# Patient Record
Sex: Female | Born: 1995
Health system: Southern US, Community
[De-identification: ages and names within clinical notes are randomized; demographics above are authoritative.]

## PROBLEM LIST (undated history)

## (undated) DIAGNOSIS — M419 Scoliosis, unspecified: Secondary | ICD-10-CM

## (undated) DIAGNOSIS — F419 Anxiety disorder, unspecified: Secondary | ICD-10-CM

## (undated) DIAGNOSIS — O09299 Supervision of pregnancy with other poor reproductive or obstetric history, unspecified trimester: Secondary | ICD-10-CM

## (undated) HISTORY — PX: WISDOM TOOTH EXTRACTION: SHX21

## (undated) HISTORY — PX: ADENOIDECTOMY: SUR15

## (undated) HISTORY — DX: Supervision of pregnancy with other poor reproductive or obstetric history, unspecified trimester: O09.299

## (undated) HISTORY — PX: TONSILLECTOMY AND ADENOIDECTOMY: SUR1326

---

## 2014-02-07 ENCOUNTER — Encounter: Payer: Self-pay | Admitting: Women's Health

## 2014-02-07 ENCOUNTER — Ambulatory Visit (INDEPENDENT_AMBULATORY_CARE_PROVIDER_SITE_OTHER): Payer: BC Managed Care – PPO | Admitting: Women's Health

## 2014-02-07 VITALS — BP 124/58 | Ht 64.0 in | Wt 141.0 lb

## 2014-02-07 DIAGNOSIS — B3731 Acute candidiasis of vulva and vagina: Secondary | ICD-10-CM

## 2014-02-07 DIAGNOSIS — N921 Excessive and frequent menstruation with irregular cycle: Secondary | ICD-10-CM

## 2014-02-07 DIAGNOSIS — N898 Other specified noninflammatory disorders of vagina: Secondary | ICD-10-CM

## 2014-02-07 DIAGNOSIS — B373 Candidiasis of vulva and vagina: Secondary | ICD-10-CM | POA: Insufficient documentation

## 2014-02-07 LAB — POCT WET PREP (WET MOUNT): Clue Cells Wet Prep Whiff POC: NEGATIVE

## 2014-02-07 MED ORDER — FLUCONAZOLE 150 MG PO TABS: 150.0000 mg | ORAL_TABLET | Freq: Once | ORAL | Status: DC

## 2014-02-07 NOTE — Progress Notes (Signed)
Patient ID: Jacqueline Barrera, female   DOB: [birth year], 18 y.o.   MRN: [id number]   [facility] Visit  Patient name: Jacqueline Barrera MRN [id number]  Date of birth: [birth year]  CC & HPI:  Jacqueline Barrera is a 18 y.o. African American female presenting today for new gyn visit w/ [place], started depo in May at [facility], has gotten 2 injections, after this last one she began to have irregular bleeding- was given rx for estradiol which initially stopped the bleeding, so she stopped the pills, then she began to have a brownish slimy malodorous d/c- so she started pills back last week and has been on period since, having to wear panty liner daily. Condoms occasionally, w/ same partner for awhile. Last STD check in May.   Pertinent History Reviewed:  Medical & Surgical Hx:   History reviewed. No pertinent past medical history. Past Surgical History  Procedure Laterality Date  . Tonsillectomy and adenoidectomy     Medications: Reviewed & Updated - see associated section Social History: Reviewed -  reports that she quit smoking 4 days ago. Her smoking use included Cigarettes. She has a .3 pack-year smoking history. She does not have any smokeless tobacco history on file.  Objective Findings:  Vitals: BP 124/58  Ht  (1.626 m)  Wt 141 lb (63.957 kg)  BMI 24.19 kg/m2  Physical Examination: General appearance - alert, well appearing, and in no distress Pelvic - normal external genitalia, vulva Cervix posterior w/ few small white circular areas w/ brown dots in center on cervix- able to wipe these off w/ cotton swab. No cervical polyps. Thick white clumpy slightly malodorous d/c adherent to vaginal walls, some areas of brownish d/c as well. No menstrual type bleeding.  No CMT, uterus & adnexae w/o tenderness or masses  Results for orders placed in visit on 02/07/14 (from the past 24 hour(s))  POCT WET PREP (WET MOUNT)   Collection Time    02/07/14  9:50 AM      Result Value Ref Range   Source Wet  Prep POC vaginal     WBC, Wet Prep HPF POC many     Bacteria Wet Prep HPF POC none     BACTERIA WET PREP MORPHOLOGY POC       Clue Cells Wet Prep HPF POC None     Clue Cells Wet Prep Whiff POC Negative Whiff     Yeast Wet Prep HPF POC Moderate     KOH Wet Prep POC       Trichomonas Wet Prep HPF POC none       Assessment & Plan:  A:   BTB on Depo  Vaginal yeast P:  Rx diflucan  Stop estradiol for now  Will send urine for gc/ct  Condoms always for STI prevention   F/U when 18yo for pap & physical  Call back if BTB continues, will try megace  Marge Duncans CNM, WHNP-BC 02/07/2014 9:50 AM

## 2014-02-07 NOTE — Patient Instructions (Signed)

## 2014-02-08 ENCOUNTER — Telehealth: Payer: Self-pay | Admitting: Women's Health

## 2014-02-08 LAB — GC/CHLAMYDIA PROBE AMP
CT PROBE, AMP APTIMA: POSITIVE — AB
GC Probe RNA: NEGATIVE

## 2014-02-08 MED ORDER — AZITHROMYCIN 500 MG PO TABS: 1000.0000 mg | ORAL_TABLET | Freq: Once | ORAL | Status: DC

## 2014-02-08 NOTE — Telephone Encounter (Signed)
Notified pt of + CT, rx for azithromycin 1gm po x 1 sent to her pharmacy, she will call me back if she needs me to treat partner, otherwise he needs to go to HD or PCP for tx, no sex x 7d after both treated.  Cheral Marker, CNM, [facility] 02/08/2014 1:27 PM

## 2014-02-21 ENCOUNTER — Telehealth: Payer: Self-pay | Admitting: Women's Health

## 2014-02-21 NOTE — Telephone Encounter (Signed)
Pt called stating she does need her partner treated for STD.  His name is Wyatt Price DOB [birth year], NKDA he uses [facility] [place].  Pt informed that we would call ABX to [facility] for him.  Pt also states still having some vaginal d/c, she took the diflucan for yeast infection and finished abx for STD, she states she still has a Diflucan left, advised her she can take that and if d/c does not clear up she will need another office visit to evaluate.  Pt verbalized understanding.

## 2014-02-22 ENCOUNTER — Encounter: Payer: Self-pay | Admitting: Women's Health

## 2014-02-22 DIAGNOSIS — A749 Chlamydial infection, unspecified: Secondary | ICD-10-CM | POA: Insufficient documentation

## 2015-09-11 ENCOUNTER — Ambulatory Visit: Payer: Self-pay

## 2015-09-13 ENCOUNTER — Ambulatory Visit: Payer: 59 | Admitting: Adult Health

## 2016-04-01 ENCOUNTER — Other Ambulatory Visit: Payer: 59 | Admitting: Obstetrics and Gynecology

## 2016-04-01 ENCOUNTER — Encounter: Payer: Self-pay | Admitting: Obstetrics and Gynecology

## 2016-04-18 ENCOUNTER — Ambulatory Visit (INDEPENDENT_AMBULATORY_CARE_PROVIDER_SITE_OTHER): Payer: 59 | Admitting: Obstetrics and Gynecology

## 2016-04-18 ENCOUNTER — Other Ambulatory Visit (HOSPITAL_COMMUNITY)
Admission: RE | Admit: 2016-04-18 | Discharge: 2016-04-18 | Disposition: A | Discharge status: Home / Self Care | Payer: 59 | Source: Ambulatory Visit | Attending: Obstetrics and Gynecology | Admitting: Obstetrics and Gynecology

## 2016-04-18 ENCOUNTER — Encounter: Payer: Self-pay | Admitting: Obstetrics and Gynecology

## 2016-04-18 VITALS — BP 110/70 | HR 88 | Ht 64.5 in | Wt 153.0 lb

## 2016-04-18 DIAGNOSIS — Z01419 Encounter for gynecological examination (general) (routine) without abnormal findings: Secondary | ICD-10-CM | POA: Diagnosis not present

## 2016-04-18 DIAGNOSIS — Z113 Encounter for screening for infections with a predominantly sexual mode of transmission: Secondary | ICD-10-CM | POA: Diagnosis present

## 2016-04-18 NOTE — Progress Notes (Addendum)
  Assessment:  Annual Gyn Exam Contraception management   Plan:  1. Pap smear done, next pap due 3 years  2. Return in 2 weeks for depo injection. After that return annually or prn 3. Advised to complete a self breast exam Subjective:  Jacqueline Barrera is a 20 y.o. female No obstetric history on file. who presents for annual exam. Patient's last menstrual period was 04/05/2016. The patient has no complaints today. She reports using depo injections In the past for birth control; states she has no issues with this. , And would like to resume this  The following portions of the patient's history were reviewed and updated as appropriate: allergies, current medications, past family history, past medical history, past social history, past surgical history and problem list. History reviewed. No pertinent past medical history.  Past Surgical History:  Procedure Laterality Date  . TONSILLECTOMY AND ADENOIDECTOMY      No current outpatient prescriptions on file.  Review of Systems Constitutional: negative Gastrointestinal: negative Genitourinary: negative   Objective:  BP 110/70   Pulse 88   Ht 5' 4.5" (1.638 m)   Wt 153 lb (69.4 kg)   LMP 04/05/2016   BMI 25.86 kg/m    BMI: Body mass index is 25.86 kg/m.  General Appearance: Alert, appropriate appearance for age. No acute distress HEENT: Grossly normal Neck / Thyroid:  Cardiovascular: RRR; normal S1, S2, no murmur Lungs: CTA bilaterally Back: No CVAT Breast Exam: declined by patient Gastrointestinal: Soft, non-tender, no masses or organomegaly Pelvic Exam: Vulva and vagina appear normal. Generous, normal secretions. Cervix is tiny  Rectovaginal: not indicated Lymphatic Exam: Non-palpable nodes in neck, clavicular, axillary, or inguinal regions  Skin: no rash or abnormalities Neurologic: Normal gait and speech, no tremor  Psychiatric: Alert and oriented, appropriate affect.  Urinalysis:Not done  Jacqueline BachJohn Coleen Barrera. MD Pgr  [phone] 9:14 AM    By signing my name below, I, Jacqueline Barrera, attest that this documentation has been prepared under the direction and in the presence of Jacqueline BurrowJohn Barrera Sosha Shepherd, MD. Electronically Signed: Sonum Barrera, Neurosurgeoncribe. 04/18/16. 9:14 AM.  I personally performed the services described in this documentation, which was SCRIBED in my presence. The recorded information has been reviewed and considered accurate. It has been edited as necessary during review. Jacqueline BurrowFERGUSON,Jacqueline Barrera V, MD

## 2016-04-22 LAB — CYTOLOGY - PAP
Chlamydia: NEGATIVE
DIAGNOSIS: NEGATIVE
Neisseria Gonorrhea: NEGATIVE

## 2016-04-29 ENCOUNTER — Telehealth: Payer: Self-pay | Admitting: Obstetrics and Gynecology

## 2016-04-29 NOTE — Telephone Encounter (Signed)
Pt called stating that Dr. Emelda FearFerguson told her to call once her period has started to start her birth control. Pt states that she has started today. Please contact pt

## 2016-04-30 NOTE — Telephone Encounter (Signed)
Pt states started her period last Friday and was told by Dr. Emelda FearFerguson to call the office for a RX Depo Provera.  Pt informed Dr. Emelda FearFerguson not in the office today will be here tomorrow will route the message to him. Pt to f/u with pharmacy tomorrow afternoon. Pt verbalized understanding.

## 2016-05-06 ENCOUNTER — Telehealth: Payer: Self-pay | Admitting: *Deleted

## 2016-05-06 NOTE — Telephone Encounter (Signed)
Pt states she was told to call when she started her period and Dr. Emelda FearFerguson would sent in her Depo RX to her pharmacy, she called last Monday when her cycle started but has not heard back from us or the pharmacy.  Pt also states she has an ongoing vaginal D/C that is clear to white, no itching or irritation and sometimes has a odor to it.  Advised pt Dr. Emelda FearFerguson is out of the office until Wednesday, I will f/u with him then to see if he will sent in RX to [facility] [place], once RX is sent in she can make appt for injection.  Advised pt d/c does not sound abnormal, nothing showed up on her pap, keep an eye on it, if any color to it, strong odor or itching/irritation call us for appointment.  Pt verbalized understanding.

## 2016-05-07 MED ORDER — MEDROXYPROGESTERONE ACETATE 150 MG/ML IM SUSP: 150.0000 mg | INTRAMUSCULAR | 3 refills | Status: DC

## 2016-05-07 NOTE — Telephone Encounter (Signed)
Pt Rx for depo Provera escribed to [facility] Patient made aware. She will pick up today, and bring to office tomorrow.

## 2016-05-20 NOTE — L&D Delivery Note (Signed)
Patient is 21 y.o. G1P0 [redacted]w[redacted]d admitted IOL for IUFD, placental abruption, and severe pre-eclampsia. S/p IOL with cytotec. No ROM.  Prenatal course also complicated by [facility] use.  Delivery Note At 5:44 AM a non-viable female was delivered via Vaginal, Spontaneous Delivery (Presentation: encaul LOA).  APGAR: N/A; weight 3 lb 2.6 oz (1435 g).   Placenta status: intact - visible small abruption.  Cord: loose with delivery.  Cord pH: N/A  Anesthesia:  Epidural Episiotomy: None Lacerations: None Est. Blood Loss (mL):  400 (344 QBL prior to delivery + 50 at delivery)  Mom to postpartum.  Baby to [place].  Upon arrival patient was complete and pushing. She pushed with good maternal effort to deliver a non-viable infant in cephalic, LOA position, encaul - with placenta. Loose nuchal cord present x1. Baby delivered without difficulty and place on maternal abdomen for bonding. Cord was clamped and cut. Fundus firm with massage and Pitocin. Perineum inspected and found to have no laceration. Counts of sharps, instruments, and lap pads were all correct. Placenta to pathology.  Donette Larry, CNM, was present for delivery.  Burna Cash, MD Family Medicine Resident, PGY-3 02/04/2017 6:05 AM  Midwife attestation: I was gloved and present for delivery in its entirety and I agree with the above resident's note.  Donette Larry, CNM 7:08 AM

## 2016-07-11 ENCOUNTER — Encounter (HOSPITAL_COMMUNITY): Payer: Self-pay | Admitting: Emergency Medicine

## 2016-07-11 ENCOUNTER — Emergency Department (HOSPITAL_COMMUNITY)
Admission: EM | Admit: 2016-07-11 | Discharge: 2016-07-11 | Disposition: A | Discharge status: Home / Self Care | Payer: 59 | Attending: Emergency Medicine | Admitting: Emergency Medicine

## 2016-07-11 DIAGNOSIS — Z87891 Personal history of nicotine dependence: Secondary | ICD-10-CM | POA: Diagnosis not present

## 2016-07-11 DIAGNOSIS — R110 Nausea: Secondary | ICD-10-CM

## 2016-07-11 DIAGNOSIS — R112 Nausea with vomiting, unspecified: Secondary | ICD-10-CM | POA: Diagnosis not present

## 2016-07-11 LAB — URINALYSIS, ROUTINE W REFLEX MICROSCOPIC
Bilirubin Urine: NEGATIVE
Glucose, UA: NEGATIVE mg/dL
HGB URINE DIPSTICK: NEGATIVE
Ketones, ur: NEGATIVE mg/dL
LEUKOCYTES UA: NEGATIVE
Nitrite: NEGATIVE
PH: 5 (ref 5.0–8.0)
Protein, ur: NEGATIVE mg/dL
SPECIFIC GRAVITY, URINE: 1.026 (ref 1.005–1.030)

## 2016-07-11 LAB — COMPREHENSIVE METABOLIC PANEL
ALT: 17 U/L (ref 14–54)
AST: 19 U/L (ref 15–41)
Albumin: 4.3 g/dL (ref 3.5–5.0)
Alkaline Phosphatase: 57 U/L (ref 38–126)
Anion gap: 8 (ref 5–15)
BUN: 10 mg/dL (ref 6–20)
CHLORIDE: 101 mmol/L (ref 101–111)
CO2: 27 mmol/L (ref 22–32)
CREATININE: 0.79 mg/dL (ref 0.44–1.00)
Calcium: 9.3 mg/dL (ref 8.9–10.3)
GFR calc non Af Amer: >60 mL/min (ref >60)
Glucose, Bld: 108 mg/dL — ABNORMAL HIGH (ref 65–99)
POTASSIUM: 3.2 mmol/L — AB (ref 3.5–5.1)
SODIUM: 136 mmol/L (ref 135–145)
Total Bilirubin: 1 mg/dL (ref 0.3–1.2)
Total Protein: 7.3 g/dL (ref 6.5–8.1)

## 2016-07-11 LAB — CBC WITH DIFFERENTIAL/PLATELET
Basophils Absolute: 0 10*3/uL (ref 0.0–0.1)
Basophils Relative: 0 %
EOS PCT: 1 %
Eosinophils Absolute: 0.1 10*3/uL (ref 0.0–0.7)
HEMATOCRIT: 42.2 % (ref 36.0–46.0)
Hemoglobin: 14 g/dL (ref 12.0–15.0)
LYMPHS ABS: 2.3 10*3/uL (ref 0.7–4.0)
LYMPHS PCT: 43 %
MCH: 28.9 pg (ref 26.0–34.0)
MCHC: 33.2 g/dL (ref 30.0–36.0)
MCV: 87.2 fL (ref 78.0–100.0)
Monocytes Absolute: 0.4 10*3/uL (ref 0.1–1.0)
Monocytes Relative: 7 %
NEUTROS ABS: 2.6 10*3/uL (ref 1.7–7.7)
Neutrophils Relative %: 49 %
Platelets: 267 10*3/uL (ref 150–400)
RBC: 4.84 MIL/uL (ref 3.87–5.11)
RDW: 12.9 % (ref 11.5–15.5)
WBC: 5.3 10*3/uL (ref 4.0–10.5)

## 2016-07-11 LAB — LIPASE, BLOOD: Lipase: 10 U/L — ABNORMAL LOW (ref 11–51)

## 2016-07-11 LAB — PREGNANCY, URINE: Preg Test, Ur: NEGATIVE

## 2016-07-11 MED ORDER — PROMETHAZINE HCL 25 MG PO TABS: 25.0000 mg | ORAL_TABLET | Freq: Four times a day (QID) | ORAL | 0 refills | Status: DC | PRN

## 2016-07-11 NOTE — ED Provider Notes (Signed)
[facility]-EM[facility]Y DEPT Provider Note   CSN: [id number] Arrival date & time: 07/11/16  1636     History   Chief Complaint Chief Complaint  Patient presents with  . Nausea    HPI Jacqueline Barrera is a 21 y.o. female.  Patient is a 21 year old female with no significant past medical history. She presents for evaluation of nausea and vomiting that has been occurring intermittently over the past month. She states this occurs primarily when she eats. She denies any diarrhea. Occasionally she is experiencing some abdominal pain, however denies anything significant or of ear. She denies any fevers or chills. She denies any urinary complaints. Her last menstrual period was one month ago and normal. She is sexually active and cannot exclude the possibility of pregnancy.   The history is provided by the patient.    History reviewed. No pertinent past medical history.  Patient Active Problem List   Diagnosis Date Noted  . Chlamydia 02/22/2014  . Breakthrough bleeding on depo provera 02/07/2014  . Vaginal yeast infection 02/07/2014    Past Surgical History:  Procedure Laterality Date  . TONSILLECTOMY AND ADENOIDECTOMY      OB History    No data available       Home Medications    Prior to Admission medications   Medication Sig Start Date End Date Taking? Authorizing Provider  medroxyPROGESTERone (DEPO-PROVERA) 150 MG/ML injection Inject 1 mL (150 mg total) into the muscle every 3 (three) months. Patient not taking: Reported on 07/11/2016 05/07/16   Tilda Burrow, MD    Family History History reviewed. No pertinent family history.  Social History Social History  Substance Use Topics  . Smoking status: Former Smoker    Packs/day: 0.10    Years: 3.00    Types: Cigarettes    Quit date: 02/03/2014  . Smokeless tobacco: Never Used  . Alcohol use No     Allergies   Patient has no known allergies.   Review of Systems Review of Systems  All other systems reviewed and are  negative.    Physical Exam Updated Vital Signs BP 134/80 (BP Location: Right Arm)   Pulse 98   Temp 98.6 F (37 C) (Oral)   Resp 18   Ht 5\' 4"  (1.626 m)   Wt 160 lb (72.6 kg)   LMP 06/20/2016 (Exact Date)   SpO2 100%   BMI 27.46 kg/m   Physical Exam  Constitutional: She is oriented to person, place, and time. She appears well-developed and well-nourished. No distress.  HENT:  Head: Normocephalic and atraumatic.  Neck: Normal range of motion. Neck supple.  Cardiovascular: Normal rate and regular rhythm.  Exam reveals no gallop and no friction rub.   No murmur heard. Pulmonary/Chest: Effort normal and breath sounds normal. No respiratory distress. She has no wheezes.  Abdominal: Soft. Bowel sounds are normal. She exhibits no distension. There is no tenderness.  Musculoskeletal: Normal range of motion.  Neurological: She is alert and oriented to person, place, and time.  Skin: Skin is warm and dry. She is not diaphoretic.  Nursing note and vitals reviewed.    ED Treatments / Results  Labs (all labs ordered are listed, but only abnormal results are displayed) Labs Reviewed  COMPREHENSIVE METABOLIC PANEL  LIPASE, BLOOD  CBC WITH DIFFERENTIAL/PLATELET  URINALYSIS, ROUTINE W REFLEX MICROSCOPIC  PREGNANCY, URINE    EKG  EKG Interpretation None       Radiology No results found.  Procedures Procedures (including critical care time)  Medications  Ordered in ED Medications - No data to display   Initial Impression / Assessment and Plan / ED Course  I have reviewed the triage vital signs and the nursing notes.  Pertinent labs & imaging results that were available during my care of the patient were reviewed by me and considered in my medical decision making (see chart for details).  Patient presents here with complaints of nausea that is been ongoing for the past several weeks. Seems to be worse when she eats. Her workup reveals no laboratory study abnormalities.  She has a normal white count, normal LFTs, and urinalysis is clear. Pregnancy test is negative. She will be discharged with an outpatient ultrasound to evaluate her gallbladder.  If this is normal, she can follow up with her primary Dr. if her symptoms persist to discuss a referral to a gastroenterologist.  Final Clinical Impressions(s) / ED Diagnoses   Final diagnoses:  None    New Prescriptions New Prescriptions   No medications on file     Geoffery Lyonsouglas Liliah Dorian, MD 07/11/16 [phone]

## 2016-07-11 NOTE — Discharge Instructions (Signed)
Phenergan as prescribed as needed for nausea.  Return at the given time for an ultrasound to further evaluate her gallbladder.  Follow-up with your primary Dr. if your symptoms are not improving in the next week, and return to the ER if symptoms significantly worsen or change.

## 2016-07-11 NOTE — ED Triage Notes (Signed)
Pt reports intermittent nausea/vomiting for appx 1 month and mild abd pain.  Pt alert and oriented. LMP 06/20/16

## 2016-07-12 ENCOUNTER — Ambulatory Visit (HOSPITAL_COMMUNITY)
Admit: 2016-07-12 | Discharge: 2016-07-12 | Disposition: A | Discharge status: Home / Self Care | Payer: 59 | Attending: Emergency Medicine | Admitting: Emergency Medicine

## 2016-07-12 DIAGNOSIS — R110 Nausea: Secondary | ICD-10-CM | POA: Diagnosis present

## 2016-07-22 ENCOUNTER — Encounter: Payer: Self-pay | Admitting: Obstetrics and Gynecology

## 2016-07-22 ENCOUNTER — Ambulatory Visit (INDEPENDENT_AMBULATORY_CARE_PROVIDER_SITE_OTHER): Payer: 59 | Admitting: Obstetrics and Gynecology

## 2016-07-22 VITALS — BP 118/58 | HR 92 | Ht 64.0 in | Wt 158.0 lb

## 2016-07-22 DIAGNOSIS — Z1159 Encounter for screening for other viral diseases: Secondary | ICD-10-CM

## 2016-07-22 DIAGNOSIS — Z113 Encounter for screening for infections with a predominantly sexual mode of transmission: Secondary | ICD-10-CM

## 2016-07-22 DIAGNOSIS — N926 Irregular menstruation, unspecified: Secondary | ICD-10-CM

## 2016-07-22 DIAGNOSIS — N898 Other specified noninflammatory disorders of vagina: Secondary | ICD-10-CM

## 2016-07-22 DIAGNOSIS — Z3201 Encounter for pregnancy test, result positive: Secondary | ICD-10-CM | POA: Diagnosis not present

## 2016-07-22 DIAGNOSIS — Z118 Encounter for screening for other infectious and parasitic diseases: Principal | ICD-10-CM

## 2016-07-22 DIAGNOSIS — R1030 Lower abdominal pain, unspecified: Secondary | ICD-10-CM | POA: Diagnosis not present

## 2016-07-22 LAB — POCT URINE PREGNANCY: PREG TEST UR: POSITIVE — AB

## 2016-07-22 MED ORDER — METRONIDAZOLE 500 MG PO TABS: 500.0000 mg | ORAL_TABLET | Freq: Two times a day (BID) | ORAL | 0 refills | Status: DC

## 2016-07-22 MED ORDER — PRENATAL VITAMINS 28-0.8 MG PO TABS: 1.0000 | ORAL_TABLET | Freq: Every day | ORAL | 12 refills | Status: DC

## 2016-07-22 NOTE — Addendum Note (Signed)
Addended by: Gaylyn RongEVANS, Denman Pichardo A on: 07/22/2016 04:50 PM   Modules accepted: Orders

## 2016-07-22 NOTE — Progress Notes (Signed)
Patient ID: Jacqueline Barrera, female   DOB: 1995/12/14, 21 y.o.   MRN: [id number]   [facility] Clinic Visit  @            Patient name: Jacqueline Barrera MRN [id number]  Date of birth: Apr 11, 1996  CC & HPI:   Chief Complaint  Patient presents with  . Vaginal Discharge    D/C X 2 wks, wants STD screening, period a few days late     Jacqueline Barrera is a 21 y.o. female presenting today for intermittent vaginal discharge x 2 w with associated lower abdominal cramping x 1 w. Per pt, her discharge was similar to discharge she normally has prior to her periods, but her period never came. Pt states she very likely missed her last depo shot as her mom was administering them and she did not set any reminders. Pt is requesting pregnancy test and STD screening. She has been with her current partner for 2 years.   ROS:  ROS +vaginal discharge +late period  +lower abdominal cramping  Pertinent History Reviewed:   Reviewed Medical        History reviewed. No pertinent past medical history.                            Surgical Hx:    Past Surgical History:  Procedure Laterality Date  . TONSILLECTOMY AND ADENOIDECTOMY     Medications: Reviewed & Updated - see associated section                       Current Outpatient Prescriptions:  .  medroxyPROGESTERone (DEPO-PROVERA) 150 MG/ML injection, Inject 1 mL (150 mg total) into the muscle every 3 (three) months. (Patient not taking: Reported on 07/11/2016), Disp: 1 mL, Rfl: 3 .  promethazine (PHENERGAN) 25 MG tablet, Take 1 tablet (25 mg total) by mouth every 6 (six) hours as needed for nausea. (Patient not taking: Reported on 07/22/2016), Disp: 10 tablet, Rfl: 0   Social History: Reviewed -  reports that she quit smoking about 2 years ago. Her smoking use included Cigars. She has a 0.30 pack-year smoking history. She has never used smokeless tobacco.  Objective Findings:  Vitals: Blood pressure (!) 118/58, pulse 92, height  (1.626 m), weight 158 lb  (71.7 kg), last menstrual period 06/22/2016.  Physical Examination: General appearance - alert, well appearing, and in no distress Mental status - alert, oriented to person, place, and time Abdomen - soft, nontender, nondistended, no masses or organomegaly Pelvic -  VULVA: normal appearing vulva with no masses, tenderness or lesions,  VAGINA: normal appearing vagina with normal color and discharge, no lesions, moderate secretions normal in appearance  CERVIX: normal appearing cervix without discharge or lesions UTERUS: uterus is normal size, shape, consistency and nontender.   GC/CHL collected  Wet prep collected: +BV  Assessment & Plan:   A:  1. Positive UPT 2. BV   P:  1. Rx Flagyl after pnI visit . Rx given 2. F/u at 7w gestation for prenatal visit     By signing my name below, I, Doreatha Martin, attest that this documentation has been prepared under the direction and in the presence of Tilda Burrow, MD. Electronically Signed: Doreatha Martin, ED Scribe. 07/22/16. 4:10 PM.  I personally performed the services described in this documentation, which was SCRIBED in my presence. The recorded information has been reviewed and considered accurate. It has been edited  as necessary during review. Tilda Burrow, MD

## 2016-07-23 ENCOUNTER — Other Ambulatory Visit: Payer: Self-pay | Admitting: *Deleted

## 2016-07-23 LAB — GC/CHLAMYDIA PROBE AMP
CHLAMYDIA, DNA PROBE: NEGATIVE
Neisseria gonorrhoeae by PCR: NEGATIVE

## 2016-07-24 ENCOUNTER — Telehealth: Payer: Self-pay | Admitting: *Deleted

## 2016-07-24 MED ORDER — PRENATAL PLUS 27-1 MG PO TABS: ORAL_TABLET | ORAL | 11 refills | Status: DC

## 2016-07-24 NOTE — Telephone Encounter (Signed)
Patient wants PNV script. Thanks

## 2016-07-24 NOTE — Addendum Note (Signed)
Addended by: Cheral MarkerBOOKER, Montine Hight R on: 07/24/2016 05:10 PM   Modules accepted: Orders

## 2016-07-31 ENCOUNTER — Other Ambulatory Visit: Payer: Self-pay | Admitting: Advanced Practice Midwife

## 2016-07-31 ENCOUNTER — Telehealth: Payer: Self-pay | Admitting: Advanced Practice Midwife

## 2016-07-31 MED ORDER — METOCLOPRAMIDE HCL 10 MG PO TABS: 10.0000 mg | ORAL_TABLET | Freq: Four times a day (QID) | ORAL | 1 refills | Status: DC | PRN

## 2016-07-31 NOTE — Telephone Encounter (Signed)
Patient called complaining of nausea and vomiting. She has not had her new ob visit. She would like something for nausea and vomiting. Please advise.

## 2016-07-31 NOTE — Telephone Encounter (Signed)
reglan sent (already has phenergan)

## 2016-08-09 ENCOUNTER — Other Ambulatory Visit: Payer: Self-pay | Admitting: Obstetrics & Gynecology

## 2016-08-09 DIAGNOSIS — O3680X0 Pregnancy with inconclusive fetal viability, not applicable or unspecified: Secondary | ICD-10-CM

## 2016-08-12 ENCOUNTER — Ambulatory Visit (INDEPENDENT_AMBULATORY_CARE_PROVIDER_SITE_OTHER): Payer: 59

## 2016-08-12 DIAGNOSIS — O3680X0 Pregnancy with inconclusive fetal viability, not applicable or unspecified: Secondary | ICD-10-CM

## 2016-08-12 NOTE — Progress Notes (Signed)
US 7+2 wks,single IUP w/ys,pos fht 164 bpm,normal ovaries bilat,crl 14.61 mm

## 2016-08-18 ENCOUNTER — Encounter (HOSPITAL_COMMUNITY): Payer: Self-pay | Admitting: *Deleted

## 2016-08-18 ENCOUNTER — Emergency Department (HOSPITAL_COMMUNITY)
Admission: EM | Admit: 2016-08-18 | Discharge: 2016-08-19 | Disposition: A | Discharge status: Home / Self Care | Payer: 59 | Attending: Emergency Medicine | Admitting: Emergency Medicine

## 2016-08-18 DIAGNOSIS — O4690 Antepartum hemorrhage, unspecified, unspecified trimester: Secondary | ICD-10-CM

## 2016-08-18 DIAGNOSIS — O209 Hemorrhage in early pregnancy, unspecified: Secondary | ICD-10-CM | POA: Insufficient documentation

## 2016-08-18 DIAGNOSIS — O219 Vomiting of pregnancy, unspecified: Secondary | ICD-10-CM | POA: Insufficient documentation

## 2016-08-18 DIAGNOSIS — Z87891 Personal history of nicotine dependence: Secondary | ICD-10-CM | POA: Diagnosis not present

## 2016-08-18 DIAGNOSIS — Z3A01 Less than 8 weeks gestation of pregnancy: Secondary | ICD-10-CM | POA: Insufficient documentation

## 2016-08-18 NOTE — ED Triage Notes (Signed)
Pt c/o vaginal bleeding and cramping x 3 days; pt states the discharge has ranged from bright red to brown

## 2016-08-19 LAB — CBC
HEMATOCRIT: 37.6 % (ref 36.0–46.0)
HEMOGLOBIN: 12.7 g/dL (ref 12.0–15.0)
MCH: 28.7 pg (ref 26.0–34.0)
MCHC: 33.8 g/dL (ref 30.0–36.0)
MCV: 84.9 fL (ref 78.0–100.0)
Platelets: 314 10*3/uL (ref 150–400)
RBC: 4.43 MIL/uL (ref 3.87–5.11)
RDW: 12.5 % (ref 11.5–15.5)
WBC: 7.6 10*3/uL (ref 4.0–10.5)

## 2016-08-19 LAB — ABO/RH: ABO/RH(D): O POS

## 2016-08-19 LAB — WET PREP, GENITAL
SPERM: NONE SEEN
TRICH WET PREP: NONE SEEN
WBC WET PREP: NONE SEEN
YEAST WET PREP: NONE SEEN

## 2016-08-19 MED ORDER — ONDANSETRON 4 MG PO TBDP
4.0000 mg | ORAL_TABLET | Freq: Once | ORAL | Status: AC
  Administered 2016-08-19: 4 mg via ORAL
  Filled 2016-08-19: qty 1

## 2016-08-19 NOTE — Discharge Instructions (Signed)
Follow up with Dr. Emelda Fear as scheduled or sooner if symptoms worsen.

## 2016-08-19 NOTE — ED Provider Notes (Signed)
[facility] DEPT Provider Note   CSN: [id number] Arrival date & time: 08/18/16  2259     History   Chief Complaint Chief Complaint  Patient presents with  . Vaginal Bleeding    HPI Jacqueline Barrera is a 21 y.o. G1P0 @ [redacted]w[redacted]d gestation who presents to the ED with vaginal bleeding. Sexually active with one female partner x 2 years. Hx of Chlamydia 2 years ago. [facility]   The history is provided by the patient. No language interpreter was used.  Vaginal Bleeding  Primary symptoms include vaginal bleeding. There has been no fever. This is a new problem. The current episode started more than 2 days ago. The problem has not changed since onset.The symptoms occur spontaneously. She is pregnant. The discharge was bloody. Associated symptoms include nausea, vomiting and frequency. Pertinent negatives include no dizziness. Abdominal pain: cramping. She has tried nothing for the symptoms.    History reviewed. No pertinent past medical history.  Patient Active Problem List   Diagnosis Date Noted  . Chlamydia 02/22/2014  . Breakthrough bleeding on depo provera 02/07/2014  . Vaginal yeast infection 02/07/2014    Past Surgical History:  Procedure Laterality Date  . TONSILLECTOMY AND ADENOIDECTOMY      OB History    Gravida Para Term Preterm AB Living   1             SAB TAB Ectopic Multiple Live Births                   Home Medications    Prior to Admission medications   Medication Sig Start Date End Date Taking? Authorizing Provider  medroxyPROGESTERone (DEPO-PROVERA) 150 MG/ML injection Inject 1 mL (150 mg total) into the muscle every 3 (three) months. Patient not taking: Reported on 07/11/2016 05/07/16   Tilda Burrow, MD  metoCLOPramide (REGLAN) 10 MG tablet Take 1 tablet (10 mg total) by mouth every 6 (six) hours as needed for nausea. 07/31/16   [name], CNM  metroNIDAZOLE (FLAGYL) 500 MG tablet Take 1 tablet (500 mg total) by  mouth 2 (two) times daily. 07/22/16   Tilda Burrow, MD  prenatal vitamin w/FE, FA (PRENATAL 1 + 1) 27-1 MG TABS tablet Take 1 tablet daily 07/24/16   Cheral Marker, CNM  promethazine (PHENERGAN) 25 MG tablet Take 1 tablet (25 mg total) by mouth every 6 (six) hours as needed for nausea. Patient not taking: Reported on 07/22/2016 07/11/16   Geoffery Lyons, MD    Family History History reviewed. No pertinent family history.  Social History Social History  Substance Use Topics  . Smoking status: Former Smoker    Packs/day: 0.10    Years: 3.00    Types: Cigars    Quit date: 02/03/2014  . Smokeless tobacco: Never Used  . Alcohol use 21.0 oz/week    35 Shots of liquor per week     Allergies   Patient has no known allergies.   Review of Systems Review of Systems  Constitutional: Negative for chills and fever.  HENT: Negative for ear pain and sore throat.   Respiratory: Negative for cough and shortness of breath.   Gastrointestinal: Positive for nausea and vomiting. Abdominal pain: cramping.  Genitourinary: Positive for frequency, vaginal bleeding and vaginal discharge.  Musculoskeletal: Negative for back pain and neck pain.  Skin: Negative for rash.  Neurological: Negative for dizziness, syncope and headaches.  Psychiatric/Behavioral: Negative for confusion.     Physical Exam Updated Vital  Signs BP 124/76 (BP Location: Right Arm)   Pulse (!) 107   Temp 98.4 F (36.9 C) (Oral)   Resp 16   Ht  (1.626 m)   Wt 70.8 kg   LMP 06/22/2016 (Approximate)   SpO2 99%   BMI 26.78 kg/m   Physical Exam  Constitutional: She is oriented to person, place, and time. She appears well-developed and well-nourished. No distress.  HENT:  Head: Normocephalic and atraumatic.  Eyes: EOM are normal.  Neck: Normal range of motion. Neck supple.  Cardiovascular: Normal rate and regular rhythm.   Pulmonary/Chest: Effort normal and breath sounds normal.  Abdominal: Soft. Bowel sounds are  normal. There is tenderness in the epigastric area. There is no CVA tenderness.  Tenderness is mild.  Genitourinary:  Genitourinary Comments: External genitalia without lesions, scant brown d/c vaginal vault, cervix long, closed. No tenderness on exam. Uterus approximately 8 week size.   Musculoskeletal: Normal range of motion.  Neurological: She is alert and oriented to person, place, and time. No cranial nerve deficit.  Skin: Skin is warm and dry.  Psychiatric: She has a normal mood and affect. Her behavior is normal.  Nursing note and vitals reviewed.    ED Treatments / Results: Ultrasound at bedside by me shows an IUP with cardiac activity. Patient able to visualize active fetus with heart beat.   Labs  (all labs ordered are listed, but only abnormal results are displayed) Labs Reviewed  WET PREP, GENITAL  CBC  ABO/RH  GC/CHLAMYDIA PROBE AMP ([place]) NOT AT [facility]  Patient blood type O positive  Radiology No results found.  Procedures Procedures (including critical care time)  Medications Ordered in ED Medications  ondansetron (ZOFRAN-ODT) disintegrating tablet 4 mg (4 mg Oral Given 08/19/16 0102)     Initial Impression / Assessment and Plan / ED Course  I have reviewed the triage vital signs and the nursing notes.  Pertinent labs & imaging results that were available during my care of the patient were reviewed by me and considered in my medical decision making (see chart for details).   Final Clinical Impressions(s) / ED Diagnoses  21 y.o. female with hx of vaginal bleeding that has stopped stable for d/c without acute abdomen and viable IUP. Discussed bleeding in early pregnancy and f/u. Patient voices understanding and agrees with plan. She has a f/u appointment with Dr. Emelda Fear scheduled.  Final diagnoses:  Vaginal bleeding in pregnancy    New Prescriptions New Prescriptions   No medications on file     [facility], NP 08/19/16 1610    Devoria Albe,  MD 08/19/16 [phone]

## 2016-08-20 LAB — GC/CHLAMYDIA PROBE AMP (~~LOC~~) NOT AT ARMC
Chlamydia: NEGATIVE
NEISSERIA GONORRHEA: NEGATIVE

## 2016-08-28 ENCOUNTER — Ambulatory Visit: Payer: 59 | Admitting: *Deleted

## 2016-08-28 ENCOUNTER — Ambulatory Visit (INDEPENDENT_AMBULATORY_CARE_PROVIDER_SITE_OTHER): Payer: 59 | Admitting: Advanced Practice Midwife

## 2016-08-28 ENCOUNTER — Encounter: Payer: Self-pay | Admitting: Advanced Practice Midwife

## 2016-08-28 VITALS — BP 120/68 | HR 98 | Wt 160.0 lb

## 2016-08-28 DIAGNOSIS — Z3682 Encounter for antenatal screening for nuchal translucency: Secondary | ICD-10-CM

## 2016-08-28 DIAGNOSIS — Z3400 Encounter for supervision of normal first pregnancy, unspecified trimester: Secondary | ICD-10-CM | POA: Insufficient documentation

## 2016-08-28 DIAGNOSIS — F1290 Cannabis use, unspecified, uncomplicated: Secondary | ICD-10-CM | POA: Insufficient documentation

## 2016-08-28 DIAGNOSIS — Z3401 Encounter for supervision of normal first pregnancy, first trimester: Secondary | ICD-10-CM | POA: Diagnosis not present

## 2016-08-28 DIAGNOSIS — Z331 Pregnant state, incidental: Secondary | ICD-10-CM

## 2016-08-28 DIAGNOSIS — Z1389 Encounter for screening for other disorder: Secondary | ICD-10-CM

## 2016-08-28 DIAGNOSIS — Z3A09 9 weeks gestation of pregnancy: Secondary | ICD-10-CM

## 2016-08-28 LAB — POCT URINALYSIS DIPSTICK
Blood, UA: NEGATIVE
Glucose, UA: NEGATIVE
KETONES UA: NEGATIVE
Leukocytes, UA: NEGATIVE
Nitrite, UA: NEGATIVE
PROTEIN UA: NEGATIVE

## 2016-08-28 NOTE — Patient Instructions (Signed)
 First Trimester of Pregnancy The first trimester of pregnancy is from week 1 until the end of week 12 (months 1 through 3). A week after a sperm fertilizes an egg, the egg will implant on the wall of the uterus. This embryo will begin to develop into a baby. Genes from you and your partner are forming the baby. The female genes determine whether the baby is a boy or a girl. At 6-8 weeks, the eyes and face are formed, and the heartbeat can be seen on ultrasound. At the end of 12 weeks, all the baby's organs are formed.  Now that you are pregnant, you will want to do everything you can to have a healthy baby. Two of the most important things are to get good prenatal care and to follow your health care provider's instructions. Prenatal care is all the medical care you receive before the baby's birth. This care will help prevent, find, and treat any problems during the pregnancy and childbirth. BODY CHANGES Your body goes through many changes during pregnancy. The changes vary from woman to woman.   You may gain or lose a couple of pounds at first.  You may feel sick to your stomach (nauseous) and throw up (vomit). If the vomiting is uncontrollable, call your health care provider.  You may tire easily.  You may develop headaches that can be relieved by medicines approved by your health care provider.  You may urinate more often. Painful urination may mean you have a bladder infection.  You may develop heartburn as a result of your pregnancy.  You may develop constipation because certain hormones are causing the muscles that push waste through your intestines to slow down.  You may develop hemorrhoids or swollen, bulging veins (varicose veins).  Your breasts may begin to grow larger and become tender. Your nipples may stick out more, and the tissue that surrounds them (areola) may become darker.  Your gums may bleed and may be sensitive to brushing and flossing.  Dark spots or blotches  (chloasma, mask of pregnancy) may develop on your face. This will likely fade after the baby is born.  Your menstrual periods will stop.  You may have a loss of appetite.  You may develop cravings for certain kinds of food.  You may have changes in your emotions from day to day, such as being excited to be pregnant or being concerned that something may go wrong with the pregnancy and baby.  You may have more vivid and strange dreams.  You may have changes in your hair. These can include thickening of your hair, rapid growth, and changes in texture. Some women also have hair loss during or after pregnancy, or hair that feels dry or thin. Your hair will most likely return to normal after your baby is born. WHAT TO EXPECT AT YOUR PRENATAL VISITS During a routine prenatal visit:  You will be weighed to make sure you and the baby are growing normally.  Your blood pressure will be taken.  Your abdomen will be measured to track your baby's growth.  The fetal heartbeat will be listened to starting around week 10 or 12 of your pregnancy.  Test results from any previous visits will be discussed. Your health care provider may ask you:  How you are feeling.  If you are feeling the baby move.  If you have had any abnormal symptoms, such as leaking fluid, bleeding, severe headaches, or abdominal cramping.  If you have any questions. Other   tests that may be performed during your first trimester include:  Blood tests to find your blood type and to check for the presence of any previous infections. They will also be used to check for low iron levels (anemia) and Rh antibodies. Later in the pregnancy, blood tests for diabetes will be done along with other tests if problems develop.  Urine tests to check for infections, diabetes, or protein in the urine.  An ultrasound to confirm the proper growth and development of the baby.  An amniocentesis to check for possible genetic problems.  Fetal  screens for spina bifida and Down syndrome.  You may need other tests to make sure you and the baby are doing well. HOME CARE INSTRUCTIONS  Medicines  Follow your health care provider's instructions regarding medicine use. Specific medicines may be either safe or unsafe to take during pregnancy.  Take your prenatal vitamins as directed.  If you develop constipation, try taking a stool softener if your health care provider approves. Diet  Eat regular, well-balanced meals. Choose a variety of foods, such as meat or vegetable-based protein, fish, milk and low-fat dairy products, vegetables, fruits, and whole grain breads and cereals. Your health care provider will help you determine the amount of weight gain that is right for you.  Avoid raw meat and uncooked cheese. These carry germs that can cause birth defects in the baby.  Eating four or five small meals rather than three large meals a day may help relieve nausea and vomiting. If you start to feel nauseous, eating a few soda crackers can be helpful. Drinking liquids between meals instead of during meals also seems to help nausea and vomiting.  If you develop constipation, eat more high-fiber foods, such as fresh vegetables or fruit and whole grains. Drink enough fluids to keep your urine clear or pale yellow. Activity and Exercise  Exercise only as directed by your health care provider. Exercising will help you:  Control your weight.  Stay in shape.  Be prepared for labor and delivery.  Experiencing pain or cramping in the lower abdomen or low back is a good sign that you should stop exercising. Check with your health care provider before continuing normal exercises.  Try to avoid standing for long periods of time. Move your legs often if you must stand in one place for a long time.  Avoid heavy lifting.  Wear low-heeled shoes, and practice good posture.  You may continue to have sex unless your health care provider directs you  otherwise. Relief of Pain or Discomfort  Wear a good support bra for breast tenderness.   Take warm sitz baths to soothe any pain or discomfort caused by hemorrhoids. Use hemorrhoid cream if your health care provider approves.   Rest with your legs elevated if you have leg cramps or low back pain.  If you develop varicose veins in your legs, wear support hose. Elevate your feet for 15 minutes, 3-4 times a day. Limit salt in your diet. Prenatal Care  Schedule your prenatal visits by the twelfth week of pregnancy. They are usually scheduled monthly at first, then more often in the last 2 months before delivery.  Write down your questions. Take them to your prenatal visits.  Keep all your prenatal visits as directed by your health care provider. Safety  Wear your seat belt at all times when driving.  Make a list of emergency phone numbers, including numbers for family, friends, the hospital, and police and fire departments. General   Tips  Ask your health care provider for a referral to a local prenatal education class. Begin classes no later than at the beginning of month 6 of your pregnancy.  Ask for help if you have counseling or nutritional needs during pregnancy. Your health care provider can offer advice or refer you to specialists for help with various needs.  Do not use hot tubs, steam rooms, or saunas.  Do not douche or use tampons or scented sanitary pads.  Do not cross your legs for long periods of time.  Avoid cat litter boxes and soil used by cats. These carry germs that can cause birth defects in the baby and possibly loss of the fetus by miscarriage or stillbirth.  Avoid all smoking, herbs, alcohol, and medicines not prescribed by your health care provider. Chemicals in these affect the formation and growth of the baby.  Schedule a dentist appointment. At home, brush your teeth with a soft toothbrush and be gentle when you floss. SEEK MEDICAL CARE IF:   You have  dizziness.  You have mild pelvic cramps, pelvic pressure, or nagging pain in the abdominal area.  You have persistent nausea, vomiting, or diarrhea.  You have a bad smelling vaginal discharge.  You have pain with urination.  You notice increased swelling in your face, hands, legs, or ankles. SEEK IMMEDIATE MEDICAL CARE IF:   You have a fever.  You are leaking fluid from your vagina.  You have spotting or bleeding from your vagina.  You have severe abdominal cramping or pain.  You have rapid weight gain or loss.  You vomit blood or material that looks like coffee grounds.  You are exposed to German measles and have never had them.  You are exposed to fifth disease or chickenpox.  You develop a severe headache.  You have shortness of breath.  You have any kind of trauma, such as from a fall or a car accident. Document Released: 04/30/2001 Document Revised: 09/20/2013 Document Reviewed: 03/16/2013 Exit[employer] Patient Information 2015 [employer]. This information is not intended to replace advice given to you by your health care provider. Make sure you discuss any questions you have with your health care provider.   Nausea & Vomiting  Have saltine crackers or pretzels by your bed and eat a few bites before you raise your head out of bed in the morning  Eat small frequent meals throughout the day instead of large meals  Drink plenty of fluids throughout the day to stay hydrated, just don't drink a lot of fluids with your meals.  This can make your stomach fill up faster making you feel sick  Do not brush your teeth right after you eat  Products with real ginger are good for nausea, like ginger ale and ginger hard candy Make sure it says made with real ginger!  Sucking on sour candy like lemon heads is also good for nausea  If your prenatal vitamins make you nauseated, take them at night so you will sleep through the nausea  Sea Bands  If you feel like you need  medicine for the nausea & vomiting please let us know  If you are unable to keep any fluids or food down please let us know   Constipation  Drink plenty of fluid, preferably water, throughout the day  Eat foods high in fiber such as fruits, vegetables, and grains  Exercise, such as walking, is a good way to keep your bowels regular  Drink warm fluids, especially warm   prune juice, or decaf coffee  Eat a 1/2 cup of real oatmeal (not instant), 1/2 cup applesauce, and 1/2-1 cup warm prune juice every day  If needed, you may take Colace (docusate sodium) stool softener once or twice a day to help keep the stool soft. If you are pregnant, wait until you are out of your first trimester (12-14 weeks of pregnancy)  If you still are having problems with constipation, you may take Miralax once daily as needed to help keep your bowels regular.  If you are pregnant, wait until you are out of your first trimester (12-14 weeks of pregnancy)  Safe Medications in Pregnancy   Acne: Benzoyl Peroxide Salicylic Acid  Backache/Headache: Tylenol: 2 regular strength every 4 hours OR              2 Extra strength every 6 hours  Colds/Coughs/Allergies: Benadryl (alcohol free) 25 mg every 6 hours as needed Breath right strips Claritin Cepacol throat lozenges Chloraseptic throat spray Cold-Eeze- up to three times per day Cough drops, alcohol free Flonase (by prescription only) Guaifenesin Mucinex Robitussin DM (plain only, alcohol free) Saline nasal spray/drops Sudafed (pseudoephedrine) & Actifed ** use only after [redacted] weeks gestation and if you do not have high blood pressure Tylenol Vicks Vaporub Zinc lozenges Zyrtec   Constipation: Colace Ducolax suppositories Fleet enema Glycerin suppositories Metamucil Milk of magnesia Miralax Senokot Smooth move tea  Diarrhea: Kaopectate Imodium A-D  *NO pepto Bismol  Hemorrhoids: Anusol Anusol HC Preparation  H Tucks  Indigestion: Tums Maalox Mylanta Zantac  Pepcid  Insomnia: Benadryl (alcohol free) 25mg every 6 hours as needed Tylenol PM Unisom, no Gelcaps  Leg Cramps: Tums MagGel  Nausea/Vomiting:  Bonine Dramamine Emetrol Ginger extract Sea bands Meclizine  Nausea medication to take during pregnancy:  Unisom (doxylamine succinate 25 mg tablets) Take one tablet daily at bedtime. If symptoms are not adequately controlled, the dose can be increased to a maximum recommended dose of two tablets daily (1/2 tablet in the morning, 1/2 tablet mid-afternoon and one at bedtime). Vitamin B6 100mg tablets. Take one tablet twice a day (up to 200 mg per day).  Skin Rashes: Aveeno products Benadryl cream or 25mg every 6 hours as needed Calamine Lotion 1% cortisone cream  Yeast infection: Gyne-lotrimin 7 Monistat 7   **If taking multiple medications, please check labels to avoid duplicating the same active ingredients **take medication as directed on the label ** Do not exceed 4000 mg of tylenol in 24 hours **Do not take medications that contain aspirin or ibuprofen      

## 2016-08-28 NOTE — Progress Notes (Signed)
  Subjective:    Jacqueline Barrera is a G1P0 [redacted]w[redacted]d being seen today for her first obstetrical visit.  Her obstetrical history is significant for first pregnancy.  Pregnancy history fully reviewed.  Patient reports no complaints.  Vitals:   08/28/16 1348  BP: 120/68  Pulse: 98  Weight: 160 lb (72.6 kg)    HISTORY: OB History  Gravida Para Term Preterm AB Living  1            SAB TAB Ectopic Multiple Live Births               # Outcome Date GA Lbr Len/2nd Weight Sex Delivery Anes PTL Lv  1 Current              History reviewed. No pertinent past medical history. Past Surgical History:  Procedure Laterality Date  . ADENOIDECTOMY    . TONSILLECTOMY AND ADENOIDECTOMY    . WISDOM TOOTH EXTRACTION     Family History  Problem Relation Age of Onset  . Hyperlipidemia Maternal Aunt   . Hypertension Maternal Grandmother      Exam                                      System:     Skin: normal coloration and turgor, no rashes    Neurologic: oriented, normal, normal mood   Extremities: normal strength, tone, and muscle mass   HEENT PERRLA   Mouth/Teeth mucous membranes moist, normal dentition   Neck supple and no masses   Cardiovascular: regular rate and rhythm   Respiratory:  appears well, vitals normal, no respiratory distress, acyanotic   Abdomen: soft, non-tender;  FHR: 160 [place]          Assessment:    Pregnancy: G1P0 Patient Active Problem List   Diagnosis Date Noted  . Supervision of normal first pregnancy 08/28/2016  . Marijuana use 08/28/2016  . Chlamydia 02/22/2014        Plan:     Initial labs drawn. Continue prenatal vitamins  Problem list reviewed and updated  Reviewed n/v relief measures and warning s/s to report  Reviewed recommended weight gain based on pre-gravid BMI  Encouraged well-balanced diet Genetic Screening discussed Integrated Screen: requested.  Ultrasound discussed; fetal survey: requested.  Return in about 3 weeks (around  09/18/2016) for LROB, US:NT+1st IT.  Barrera,Jacqueline Jessie 08/29/2016

## 2016-08-29 LAB — SICKLE CELL SCREEN: Sickle Cell Screen: NEGATIVE

## 2016-08-29 LAB — PMP SCREEN PROFILE (10S), URINE
AMPHETAMINE SCRN UR: NEGATIVE ng/mL
Barbiturate Screen, Ur: NEGATIVE ng/mL
Benzodiazepine Screen, Urine: NEGATIVE ng/mL
CANNABINOIDS UR QL SCN: POSITIVE ng/mL
CREATININE(CRT), U: 171.8 mg/dL (ref 20.0–300.0)
Cocaine(Metab.)Screen, Urine: NEGATIVE ng/mL
Methadone Scn, Ur: NEGATIVE ng/mL
OPIATE SCRN UR: NEGATIVE ng/mL
Oxycodone+Oxymorphone Ur Ql Scn: NEGATIVE ng/mL
PCP SCRN UR: NEGATIVE ng/mL
PH UR, DRUG SCRN: 5.6 (ref 4.5–8.9)
Propoxyphene, Screen: NEGATIVE ng/mL

## 2016-08-29 LAB — URINALYSIS, ROUTINE W REFLEX MICROSCOPIC
Bilirubin, UA: NEGATIVE
Glucose, UA: NEGATIVE
Ketones, UA: NEGATIVE
Leukocytes, UA: NEGATIVE
NITRITE UA: NEGATIVE
Protein, UA: NEGATIVE
RBC, UA: NEGATIVE
Specific Gravity, UA: 1.027 (ref 1.005–1.030)
Urobilinogen, Ur: 0.2 mg/dL (ref 0.2–1.0)
pH, UA: 6 (ref 5.0–7.5)

## 2016-08-29 LAB — RUBELLA SCREEN: Rubella Antibodies, IGG: 3.5 index (ref >0.99)

## 2016-08-29 LAB — HIV ANTIBODY (ROUTINE TESTING W REFLEX): HIV SCREEN 4TH GENERATION: NONREACTIVE

## 2016-08-29 LAB — VARICELLA ZOSTER ANTIBODY, IGG: Varicella zoster IgG: 1727 index (ref >165)

## 2016-08-29 LAB — HEPATITIS B SURFACE ANTIGEN: Hepatitis B Surface Ag: NEGATIVE

## 2016-08-29 LAB — RPR: RPR: NONREACTIVE

## 2016-08-29 LAB — ANTIBODY SCREEN: ANTIBODY SCREEN: NEGATIVE

## 2016-08-30 LAB — URINE CULTURE

## 2016-09-05 ENCOUNTER — Encounter: Payer: Self-pay | Admitting: Obstetrics and Gynecology

## 2016-09-18 ENCOUNTER — Encounter: Payer: 59 | Admitting: Advanced Practice Midwife

## 2016-09-18 ENCOUNTER — Other Ambulatory Visit: Payer: 59

## 2016-09-23 ENCOUNTER — Ambulatory Visit (INDEPENDENT_AMBULATORY_CARE_PROVIDER_SITE_OTHER): Payer: 59

## 2016-09-23 ENCOUNTER — Encounter: Payer: Self-pay | Admitting: Women's Health

## 2016-09-23 ENCOUNTER — Ambulatory Visit (INDEPENDENT_AMBULATORY_CARE_PROVIDER_SITE_OTHER): Payer: 59 | Admitting: Women's Health

## 2016-09-23 VITALS — BP 120/68 | HR 91 | Wt 159.0 lb

## 2016-09-23 DIAGNOSIS — Z3682 Encounter for antenatal screening for nuchal translucency: Secondary | ICD-10-CM

## 2016-09-23 DIAGNOSIS — Z3401 Encounter for supervision of normal first pregnancy, first trimester: Secondary | ICD-10-CM

## 2016-09-23 DIAGNOSIS — Z331 Pregnant state, incidental: Secondary | ICD-10-CM

## 2016-09-23 DIAGNOSIS — Z1389 Encounter for screening for other disorder: Secondary | ICD-10-CM

## 2016-09-23 LAB — POCT URINALYSIS DIPSTICK
Blood, UA: NEGATIVE
Glucose, UA: NEGATIVE
Ketones, UA: NEGATIVE
LEUKOCYTES UA: NEGATIVE
NITRITE UA: NEGATIVE
PROTEIN UA: NEGATIVE

## 2016-09-23 NOTE — Progress Notes (Signed)
US 13+2 wks,measurements c/w dates,crl 80.6 mm,ant pl 0,normal ovaries bilat,fhr 147 bpm,,NB present,NT 1.694mm

## 2016-09-23 NOTE — Progress Notes (Signed)
Low-risk OB appointment G1P0 [id number] Estimated Date of Delivery: 03/29/17 BP 120/68   Pulse 91   Wt 159 lb (72.1 kg)   LMP 06/22/2016 (Approximate)   BMI 27.29 kg/m   BP, weight, and urine reviewed.  Refer to obstetrical flow sheet for FH & FHR.  No fm yet. Denies cramping, lof, vb, or uti s/s. Passed out yesterday at work- started feeling dizzy/hot. Had only had cereal to eat. To eat frequent snacks w/ protein.  Reviewed today's normal nt u/s, warning s/s to report. Plan:  Continue routine obstetrical care  F/U in 4wks for OB appointment and 2nd IT 1st IT/NT today

## 2016-09-23 NOTE — Patient Instructions (Addendum)
For Dizzy Spells:   This is usually related to either your blood sugar or your blood pressure dropping  Make sure you are staying well hydrated and drinking enough water so that your urine is clear  Eat small frequent meals and snacks containing protein (meat, eggs, nuts, cheese) so that your blood sugar doesn't drop  If you do get dizzy, sit/lay down and get you something to drink and a snack containing protein- you will usually start feeling better in 10-20 minutes    Second Trimester of Pregnancy The second trimester is from week 14 through week 27 (months 4 through 6). The second trimester is often a time when you feel your best. Your body has adjusted to being pregnant, and you begin to feel better physically. Usually, morning sickness has lessened or quit completely, you may have more energy, and you may have an increase in appetite. The second trimester is also a time when the fetus is growing rapidly. At the end of the sixth month, the fetus is about 9 inches long and weighs about 1 pounds. You will likely begin to feel the baby move (quickening) between 16 and 20 weeks of pregnancy. Body changes during your second trimester Your body continues to go through many changes during your second trimester. The changes vary from woman to woman.  Your weight will continue to increase. You will notice your lower abdomen bulging out.  You may begin to get stretch marks on your hips, abdomen, and breasts.  You may develop headaches that can be relieved by medicines. The medicines should be approved by your health care provider.  You may urinate more often because the fetus is pressing on your bladder.  You may develop or continue to have heartburn as a result of your pregnancy.  You may develop constipation because certain hormones are causing the muscles that push waste through your intestines to slow down.  You may develop hemorrhoids or swollen, bulging veins (varicose veins).  You may  have back pain. This is caused by:  Weight gain.  Pregnancy hormones that are relaxing the joints in your pelvis.  A shift in weight and the muscles that support your balance.  Your breasts will continue to grow and they will continue to become tender.  Your gums may bleed and may be sensitive to brushing and flossing.  Dark spots or blotches (chloasma, mask of pregnancy) may develop on your face. This will likely fade after the baby is born.  A dark line from your belly button to the pubic area (linea nigra) may appear. This will likely fade after the baby is born.  You may have changes in your hair. These can include thickening of your hair, rapid growth, and changes in texture. Some women also have hair loss during or after pregnancy, or hair that feels dry or thin. Your hair will most likely return to normal after your baby is born. What to expect at prenatal visits During a routine prenatal visit:  You will be weighed to make sure you and the fetus are growing normally.  Your blood pressure will be taken.  Your abdomen will be measured to track your baby's growth.  The fetal heartbeat will be listened to.  Any test results from the previous visit will be discussed. Your health care provider may ask you:  How you are feeling.  If you are feeling the baby move.  If you have had any abnormal symptoms, such as leaking fluid, bleeding, severe headaches, or  abdominal cramping.  If you are using any tobacco products, including cigarettes, chewing tobacco, and electronic cigarettes.  If you have any questions. Other tests that may be performed during your second trimester include:  Blood tests that check for:  Low iron levels (anemia).  High blood sugar that affects pregnant women (gestational diabetes) between 19 and 28 weeks.  Rh antibodies. This is to check for a protein on red blood cells (Rh factor).  Urine tests to check for infections, diabetes, or protein in the  urine.  An ultrasound to confirm the proper growth and development of the baby.  An amniocentesis to check for possible genetic problems.  Fetal screens for spina bifida and Down syndrome.  HIV (human immunodeficiency virus) testing. Routine prenatal testing includes screening for HIV, unless you choose not to have this test. Follow these instructions at home: Medicines   Follow your health care provider's instructions regarding medicine use. Specific medicines may be either safe or unsafe to take during pregnancy.  Take a prenatal vitamin that contains at least 600 micrograms (mcg) of folic acid.  If you develop constipation, try taking a stool softener if your health care provider approves. Eating and drinking   Eat a balanced diet that includes fresh fruits and vegetables, whole grains, good sources of protein such as meat, eggs, or tofu, and low-fat dairy. Your health care provider will help you determine the amount of weight gain that is right for you.  Avoid raw meat and uncooked cheese. These carry germs that can cause birth defects in the baby.  If you have low calcium intake from food, talk to your health care provider about whether you should take a daily calcium supplement.  Limit foods that are high in fat and processed sugars, such as fried and sweet foods.  To prevent constipation:  Drink enough fluid to keep your urine clear or pale yellow.  Eat foods that are high in fiber, such as fresh fruits and vegetables, whole grains, and beans. Activity   Exercise only as directed by your health care provider. Most women can continue their usual exercise routine during pregnancy. Try to exercise for 30 minutes at least 5 days a week. Stop exercising if you experience uterine contractions.  Avoid heavy lifting, wear low heel shoes, and practice good posture.  A sexual relationship may be continued unless your health care provider directs you otherwise. Relieving pain and  discomfort   Wear a good support bra to prevent discomfort from breast tenderness.  Take warm sitz baths to soothe any pain or discomfort caused by hemorrhoids. Use hemorrhoid cream if your health care provider approves.  Rest with your legs elevated if you have leg cramps or low back pain.  If you develop varicose veins, wear support hose. Elevate your feet for 15 minutes, 3-4 times a day. Limit salt in your diet. Prenatal Care   Write down your questions. Take them to your prenatal visits.  Keep all your prenatal visits as told by your health care provider. This is important. Safety   Wear your seat belt at all times when driving.  Make a list of emergency phone numbers, including numbers for family, friends, the hospital, and police and fire departments. General instructions   Ask your health care provider for a referral to a local prenatal education class. Begin classes no later than the beginning of month 6 of your pregnancy.  Ask for help if you have counseling or nutritional needs during pregnancy. Your health care  provider can offer advice or refer you to specialists for help with various needs.  Do not use hot tubs, steam rooms, or saunas.  Do not douche or use tampons or scented sanitary pads.  Do not cross your legs for long periods of time.  Avoid cat litter boxes and soil used by cats. These carry germs that can cause birth defects in the baby and possibly loss of the fetus by miscarriage or stillbirth.  Avoid all smoking, herbs, alcohol, and unprescribed drugs. Chemicals in these products can affect the formation and growth of the baby.  Do not use any products that contain nicotine or tobacco, such as cigarettes and e-cigarettes. If you need help quitting, ask your health care provider.  Visit your dentist if you have not gone yet during your pregnancy. Use a soft toothbrush to brush your teeth and be gentle when you floss. Contact a health care provider  if:  You have dizziness.  You have mild pelvic cramps, pelvic pressure, or nagging pain in the abdominal area.  You have persistent nausea, vomiting, or diarrhea.  You have a bad smelling vaginal discharge.  You have pain when you urinate. Get help right away if:  You have a fever.  You are leaking fluid from your vagina.  You have spotting or bleeding from your vagina.  You have severe abdominal cramping or pain.  You have rapid weight gain or weight loss.  You have shortness of breath with chest pain.  You notice sudden or extreme swelling of your face, hands, ankles, feet, or legs.  You have not felt your baby move in over an hour.  You have severe headaches that do not go away when you take medicine.  You have vision changes. Summary  The second trimester is from week 14 through week 27 (months 4 through 6). It is also a time when the fetus is growing rapidly.  Your body goes through many changes during pregnancy. The changes vary from woman to woman.  Avoid all smoking, herbs, alcohol, and unprescribed drugs. These chemicals affect the formation and growth your baby.  Do not use any tobacco products, such as cigarettes, chewing tobacco, and e-cigarettes. If you need help quitting, ask your health care provider.  Contact your health care provider if you have any questions. Keep all prenatal visits as told by your health care provider. This is important. This information is not intended to replace advice given to you by your health care provider. Make sure you discuss any questions you have with your health care provider. Document Released: 04/30/2001 Document Revised: 10/12/2015 Document Reviewed: 07/07/2012 Elsevier Interactive Patient Education  2017 [employer]

## 2016-09-25 LAB — MATERNAL SCREEN, INTEGRATED #1
Crown Rump Length: 80.6 mm
GEST. AGE ON COLLECTION DATE: 13.3 wk
Maternal Age at EDD: 23.4 yr
NUMBER OF FETUSES: 1
Nuchal Translucency (NT): 1.4 mm
PAPP-A Value: 3394 ng/mL
WEIGHT: 159 [lb_av]

## 2016-10-15 ENCOUNTER — Encounter: Payer: Self-pay | Admitting: Obstetrics and Gynecology

## 2016-10-18 ENCOUNTER — Encounter (HOSPITAL_COMMUNITY): Payer: Self-pay | Admitting: *Deleted

## 2016-10-18 ENCOUNTER — Emergency Department (HOSPITAL_COMMUNITY)
Admission: EM | Admit: 2016-10-18 | Discharge: 2016-10-19 | Disposition: A | Discharge status: Home / Self Care | Payer: Medicaid Other | Attending: Emergency Medicine | Admitting: Emergency Medicine

## 2016-10-18 DIAGNOSIS — Z87891 Personal history of nicotine dependence: Secondary | ICD-10-CM | POA: Insufficient documentation

## 2016-10-18 DIAGNOSIS — R1031 Right lower quadrant pain: Secondary | ICD-10-CM | POA: Diagnosis not present

## 2016-10-18 DIAGNOSIS — O9989 Other specified diseases and conditions complicating pregnancy, childbirth and the puerperium: Secondary | ICD-10-CM | POA: Insufficient documentation

## 2016-10-18 DIAGNOSIS — R109 Unspecified abdominal pain: Secondary | ICD-10-CM

## 2016-10-18 DIAGNOSIS — R1011 Right upper quadrant pain: Secondary | ICD-10-CM | POA: Insufficient documentation

## 2016-10-18 DIAGNOSIS — Z79899 Other long term (current) drug therapy: Secondary | ICD-10-CM | POA: Insufficient documentation

## 2016-10-18 LAB — URINALYSIS, ROUTINE W REFLEX MICROSCOPIC
Bilirubin Urine: NEGATIVE
GLUCOSE, UA: NEGATIVE mg/dL
HGB URINE DIPSTICK: NEGATIVE
KETONES UR: 20 mg/dL — AB
Leukocytes, UA: NEGATIVE
Nitrite: NEGATIVE
PROTEIN: NEGATIVE mg/dL
Specific Gravity, Urine: 1.013 (ref 1.005–1.030)
pH: 6 (ref 5.0–8.0)

## 2016-10-18 NOTE — ED Provider Notes (Signed)
[facility]EM[facility]ENCY DEPT Provider Note   CSN: [id number] Arrival date & time: 10/18/16  2212     History   Chief Complaint Chief Complaint  Patient presents with  . Abdominal Pain    HPI Jacqueline Barrera is a 21 y.o. female.  HPI  The patient is a 21 year old female, G1 P0 at approximately 16-1/2 weeks based on medical record report, presents with some abdominal cramping which has been intermittent over the last 5 days. Seems to be mostly in the left lower quadrant and pelvis. No radiation, she does have associated nausea which is been present throughout the entire pregnancy. There is been no fevers, no dysuria, she does have some urinary frequency. No bowel symptoms. No medications given prior to arrival. She does have follow-up with her OB/GYN on Monday.  History reviewed. No pertinent past medical history.  Patient Active Problem List   Diagnosis Date Noted  . Supervision of normal first pregnancy 08/28/2016  . Marijuana use 08/28/2016  . Chlamydia 02/22/2014    Past Surgical History:  Procedure Laterality Date  . ADENOIDECTOMY    . TONSILLECTOMY AND ADENOIDECTOMY    . WISDOM TOOTH EXTRACTION      OB History    Gravida Para Term Preterm AB Living   1             SAB TAB Ectopic Multiple Live Births                   Home Medications    Prior to Admission medications   Medication Sig Start Date End Date Taking? Authorizing Provider  metoCLOPramide (REGLAN) 10 MG tablet Take 1 tablet (10 mg total) by mouth every 6 (six) hours as needed for nausea. Patient not taking: Reported on 09/23/2016 07/31/16   Cresenzo-Dishmon, Scarlette Calico, CNM  ondansetron (ZOFRAN ODT) 4 MG disintegrating tablet Take 1 tablet (4 mg total) by mouth every 8 (eight) hours as needed for nausea. 10/19/16   Eber Hong, MD  prenatal vitamin w/FE, FA (PRENATAL 1 + 1) 27-1 MG TABS tablet Take 1 tablet daily Patient not taking: Reported on 09/23/2016 07/24/16   Cheral Marker, CNM    Family History Family  History  Problem Relation Age of Onset  . Hyperlipidemia Maternal Aunt   . Hypertension Maternal Grandmother     Social History Social History  Substance Use Topics  . Smoking status: Former Smoker    Packs/day: 0.10    Years: 3.00    Types: Cigars    Quit date: 02/03/2014  . Smokeless tobacco: Never Used  . Alcohol use No     Comment: before pregnancy     Allergies   Other   Review of Systems Review of Systems  All other systems reviewed and are negative.    Physical Exam Updated Vital Signs BP 122/68   Pulse 88   Temp 98.7 F (37.1 C) (Oral)   Resp 20   Ht 5\' 4"  (1.626 m)   Wt 71.7 kg (158 lb)   LMP 06/22/2016 (Approximate)   SpO2 100%   BMI 27.12 kg/m   Physical Exam  Constitutional: She appears well-developed and well-nourished. No distress.  HENT:  Head: Normocephalic and atraumatic.  Mouth/Throat: Oropharynx is clear and moist. No oropharyngeal exudate.  Eyes: Conjunctivae and EOM are normal. Pupils are equal, round, and reactive to light. Right eye exhibits no discharge. Left eye exhibits no discharge. No scleral icterus.  Neck: Normal range of motion. Neck supple. No JVD present. No thyromegaly present.  Cardiovascular: Normal rate, regular rhythm, normal heart sounds and intact distal pulses.  Exam reveals no gallop and no friction rub.   No murmur heard. Pulmonary/Chest: Effort normal and breath sounds normal. No respiratory distress. She has no wheezes. She has no rales.  Abdominal: Soft. Bowel sounds are normal. She exhibits no distension and no mass. There is tenderness ( Mild tenderness across the upper abdomen as well as the lower abdomen especially on the right side. No guarding or peritoneal signs).  Palpable uterus below the umbilicus  Musculoskeletal: Normal range of motion. She exhibits no edema or tenderness.  Lymphadenopathy:    She has no cervical adenopathy.  Neurological: She is alert. Coordination normal.  Skin: Skin is warm and  dry. No rash noted. No erythema.  Psychiatric: She has a normal mood and affect. Her behavior is normal.  Nursing note and vitals reviewed.    ED Treatments / Results  Labs (all labs ordered are listed, but only abnormal results are displayed) Labs Reviewed  URINALYSIS, ROUTINE W REFLEX MICROSCOPIC - Abnormal; Notable for the following:       Result Value   Ketones, ur 20 (*)    All other components within normal limits    Radiology No results found.  Procedures Procedures (including critical care time)  Medications Ordered in ED Medications - No data to display   Initial Impression / Assessment and Plan / ED Course  I have reviewed the triage vital signs and the nursing notes.  Pertinent labs & imaging results that were available during my care of the patient were reviewed by me and considered in my medical decision making (see chart for details).    There are no signs of an acute abdomen, she has been having intermittent cramping pain which I suspect is related to round ligament pain or pregnancy related pains. On bedside ultrasound the patient has a normal-appearing fetus with normal heart tones at 160 as well as normal fetal movements. The patient is not yet feeling the fetal movements. We'll check a urinalysis, otherwise anticipate outpatient follow-up. She'll be given Tylenol as needed, prescription for Reglan for nausea. Patient expressed her understanding. Doubt appendicitis or other acute abdominal pathology.  UA clear - pt informed of need to hydrate - vomiting despite reglan - will give some zofran.  Final Clinical Impressions(s) / ED Diagnoses   Final diagnoses:  Abdominal cramping    New Prescriptions New Prescriptions   ONDANSETRON (ZOFRAN ODT) 4 MG DISINTEGRATING TABLET    Take 1 tablet (4 mg total) by mouth every 8 (eight) hours as needed for nausea.     Eber HongMiller, Meisha Salone, MD 10/19/16 [phone]

## 2016-10-18 NOTE — ED Triage Notes (Signed)
Pregnant, low abdominal pain, fhr 160 midline

## 2016-10-19 MED ORDER — ONDANSETRON 4 MG PO TBDP: 4.0000 mg | ORAL_TABLET | Freq: Three times a day (TID) | ORAL | 0 refills | Status: DC | PRN

## 2016-10-19 NOTE — Discharge Instructions (Signed)
Zofran as needed for nausea -  Drink plenty of fluids ER for severe or worsening pain, vomiting or fevers or vaginal bleeding / d/c.

## 2016-10-21 ENCOUNTER — Ambulatory Visit (INDEPENDENT_AMBULATORY_CARE_PROVIDER_SITE_OTHER): Payer: 59 | Admitting: Obstetrics & Gynecology

## 2016-10-21 ENCOUNTER — Encounter: Payer: Self-pay | Admitting: Obstetrics & Gynecology

## 2016-10-21 VITALS — BP 120/60 | HR 80 | Wt 159.0 lb

## 2016-10-21 DIAGNOSIS — Z3682 Encounter for antenatal screening for nuchal translucency: Secondary | ICD-10-CM

## 2016-10-21 DIAGNOSIS — Z331 Pregnant state, incidental: Secondary | ICD-10-CM

## 2016-10-21 DIAGNOSIS — Z3402 Encounter for supervision of normal first pregnancy, second trimester: Secondary | ICD-10-CM

## 2016-10-21 DIAGNOSIS — Z1389 Encounter for screening for other disorder: Secondary | ICD-10-CM

## 2016-10-21 DIAGNOSIS — N6001 Solitary cyst of right breast: Secondary | ICD-10-CM

## 2016-10-21 LAB — POCT URINALYSIS DIPSTICK
Blood, UA: NEGATIVE
Glucose, UA: NEGATIVE
KETONES UA: NEGATIVE
NITRITE UA: NEGATIVE
PROTEIN UA: NEGATIVE

## 2016-10-21 MED ORDER — CEPHALEXIN 500 MG PO CAPS: 500.0000 mg | ORAL_CAPSULE | Freq: Three times a day (TID) | ORAL | 0 refills | Status: DC

## 2016-10-21 NOTE — Addendum Note (Signed)
Addended by: Tish FredericksonLANCASTER, Sirenity Shew A on: 10/21/2016 11:33 AM   Modules accepted: Orders

## 2016-10-21 NOTE — Progress Notes (Signed)
G1P0 8276w2d Estimated Date of Delivery: 03/29/17  Blood pressure 120/60, pulse 80, weight 159 lb (72.1 kg), last menstrual period 06/22/2016.   BP weight and urine results all reviewed and noted.  Please refer to the obstetrical flow sheet for the fundal height and fetal heart rate documentation:  Patient reports good fetal movement, denies any bleeding and no rupture of membranes symptoms or regular contractions. Patient is without complaints. All questions were answered.  Orders Placed This Encounter  Procedures  . US OB Comp + 14 Wk  . POCT urinalysis dipstick    Plan:  Continued routine obstetrical care,  Has infected Montgomery gland, had nipple pierced Will treat with keflex 500 TID Sonogram 2 weeks  Return in about 2 weeks (around 11/04/2016) for 20 week sono, LROB.

## 2016-10-24 LAB — MATERNAL SCREEN, INTEGRATED #2
ADSF: 0.81
AFP MOM: 1.45
Alpha-Fetoprotein: 56.4 ng/mL
Crown Rump Length: 80.6 mm
DIA MoM: 1.48
DIA Value: 235.6 pg/mL
ESTRIOL UNCONJUGATED: 0.81 ng/mL
GEST. AGE ON COLLECTION DATE: 13.3 wk
Gestational Age: 17.3 weeks
Maternal Age at EDD: 23.4 yr
NUMBER OF FETUSES: 1
Nuchal Translucency (NT): 1.4 mm
Nuchal Translucency MoM: 0.71
PAPP-A MoM: 2.9
PAPP-A Value: 3394 ng/mL
TEST RESULTS: NEGATIVE
WEIGHT: 159 [lb_av]
WEIGHT: 159 [lb_av]
hCG MoM: 1.38
hCG Value: 35.4 IU/mL

## 2016-11-04 ENCOUNTER — Other Ambulatory Visit: Payer: Self-pay | Admitting: Obstetrics & Gynecology

## 2016-11-04 DIAGNOSIS — Z363 Encounter for antenatal screening for malformations: Secondary | ICD-10-CM

## 2016-11-05 ENCOUNTER — Encounter: Payer: Self-pay | Admitting: Obstetrics & Gynecology

## 2016-11-05 ENCOUNTER — Ambulatory Visit (INDEPENDENT_AMBULATORY_CARE_PROVIDER_SITE_OTHER): Payer: 59 | Admitting: Obstetrics & Gynecology

## 2016-11-05 ENCOUNTER — Ambulatory Visit (INDEPENDENT_AMBULATORY_CARE_PROVIDER_SITE_OTHER): Payer: 59

## 2016-11-05 VITALS — BP 100/60 | HR 80 | Wt 161.0 lb

## 2016-11-05 DIAGNOSIS — Z1389 Encounter for screening for other disorder: Secondary | ICD-10-CM

## 2016-11-05 DIAGNOSIS — Z331 Pregnant state, incidental: Secondary | ICD-10-CM

## 2016-11-05 DIAGNOSIS — Z3402 Encounter for supervision of normal first pregnancy, second trimester: Secondary | ICD-10-CM

## 2016-11-05 DIAGNOSIS — Z363 Encounter for antenatal screening for malformations: Secondary | ICD-10-CM | POA: Diagnosis not present

## 2016-11-05 LAB — POCT URINALYSIS DIPSTICK
Glucose, UA: NEGATIVE
Leukocytes, UA: NEGATIVE
Nitrite, UA: NEGATIVE
Protein, UA: 1
RBC UA: NEGATIVE

## 2016-11-05 NOTE — Progress Notes (Signed)
[place] 19+3wk,breech,ant pl gr 0,svp of fluid 4.5 cm,normal left ovary,right ovary not visualized,cx 3.0 cm,EFW 287 g,fhr 141 bpm,anatomy complete,no obvious abnormalities seen

## 2016-11-05 NOTE — Progress Notes (Signed)
G1P0 5323w3d Estimated Date of Delivery: 03/29/17  Blood pressure 100/60, pulse 80, weight 161 lb (73 kg), last menstrual period 06/22/2016.   BP weight and urine results all reviewed and noted.  Please refer to the obstetrical flow sheet for the fundal height and fetal heart rate documentation:  Patient reports good fetal movement, denies any bleeding and no rupture of membranes symptoms or regular contractions. Patient is without complaints. All questions were answered.  Orders Placed This Encounter  Procedures  . POCT urinalysis dipstick    Plan:  Continued routine obstetrical care, sonogram   Return in about 4 weeks (around 12/03/2016) for LROB.

## 2016-12-03 ENCOUNTER — Encounter: Payer: 59 | Admitting: Advanced Practice Midwife

## 2016-12-11 ENCOUNTER — Encounter: Payer: Self-pay | Admitting: Advanced Practice Midwife

## 2016-12-11 ENCOUNTER — Ambulatory Visit (INDEPENDENT_AMBULATORY_CARE_PROVIDER_SITE_OTHER): Payer: Medicaid Other | Admitting: Advanced Practice Midwife

## 2016-12-11 VITALS — BP 110/80 | HR 80 | Wt 167.0 lb

## 2016-12-11 DIAGNOSIS — Z3A24 24 weeks gestation of pregnancy: Secondary | ICD-10-CM

## 2016-12-11 DIAGNOSIS — Z3402 Encounter for supervision of normal first pregnancy, second trimester: Secondary | ICD-10-CM

## 2016-12-11 DIAGNOSIS — O09892 Supervision of other high risk pregnancies, second trimester: Secondary | ICD-10-CM

## 2016-12-11 DIAGNOSIS — O1212 Gestational proteinuria, second trimester: Secondary | ICD-10-CM

## 2016-12-11 DIAGNOSIS — Z331 Pregnant state, incidental: Secondary | ICD-10-CM

## 2016-12-11 DIAGNOSIS — Z1389 Encounter for screening for other disorder: Secondary | ICD-10-CM

## 2016-12-11 LAB — POCT URINALYSIS DIPSTICK
Glucose, UA: NEGATIVE
Ketones, UA: NEGATIVE
LEUKOCYTES UA: NEGATIVE
NITRITE UA: NEGATIVE
PROTEIN UA: 1
RBC UA: NEGATIVE

## 2016-12-11 NOTE — Patient Instructions (Signed)

## 2016-12-11 NOTE — Progress Notes (Signed)
G1P0 5055w4d Estimated Date of Delivery: 03/29/17  Blood pressure 110/80, pulse 80, weight 167 lb (75.8 kg), last menstrual period 06/22/2016.   BP weight and urine results all reviewed and noted. Has had 1+ protein in urine last 2 visits.  Denies UTI sx.   Please refer to the obstetrical flow sheet for the fundal height and fetal heart rate documentation:  Small pimple on right nipple, drained white, looks ok.    Patient reports good fetal movement, denies any bleeding and no rupture of membranes symptoms or regular contractions. Patient is without complaints. All questions were answered.  Orders Placed This Encounter  Procedures  . Urine Culture  . POCT urinalysis dipstick    Plan:  Continued routine obstetrical care,   Return in about 3 weeks (around 01/01/2017) for PN2/LROB.

## 2016-12-13 LAB — URINE CULTURE

## 2016-12-17 ENCOUNTER — Encounter: Payer: Self-pay | Admitting: Obstetrics & Gynecology

## 2016-12-18 ENCOUNTER — Other Ambulatory Visit: Payer: Self-pay | Admitting: Obstetrics & Gynecology

## 2016-12-18 MED ORDER — CEPHALEXIN 500 MG PO CAPS: 500.0000 mg | ORAL_CAPSULE | Freq: Three times a day (TID) | ORAL | 0 refills | Status: DC

## 2017-01-01 ENCOUNTER — Other Ambulatory Visit: Payer: Medicaid Other

## 2017-01-01 ENCOUNTER — Ambulatory Visit (INDEPENDENT_AMBULATORY_CARE_PROVIDER_SITE_OTHER): Payer: Medicaid Other | Admitting: Obstetrics and Gynecology

## 2017-01-01 ENCOUNTER — Encounter: Payer: Self-pay | Admitting: Obstetrics and Gynecology

## 2017-01-01 VITALS — BP 100/70 | HR 72 | Wt 170.8 lb

## 2017-01-01 DIAGNOSIS — Z1389 Encounter for screening for other disorder: Secondary | ICD-10-CM

## 2017-01-01 DIAGNOSIS — Z3A27 27 weeks gestation of pregnancy: Secondary | ICD-10-CM

## 2017-01-01 DIAGNOSIS — Z3402 Encounter for supervision of normal first pregnancy, second trimester: Secondary | ICD-10-CM

## 2017-01-01 DIAGNOSIS — Z331 Pregnant state, incidental: Secondary | ICD-10-CM

## 2017-01-01 DIAGNOSIS — N6452 Nipple discharge: Secondary | ICD-10-CM | POA: Insufficient documentation

## 2017-01-01 LAB — POCT URINALYSIS DIPSTICK
Blood, UA: NEGATIVE
Glucose, UA: NEGATIVE
KETONES UA: NEGATIVE
LEUKOCYTES UA: NEGATIVE
Nitrite, UA: NEGATIVE

## 2017-01-01 NOTE — Progress Notes (Signed)
Patient ID: Jacqueline PeonKrista Rettke, female   DOB: 1995-08-23, 23 y.o.   MRN: [id number] G1P0  [facility]: [facility]  Chief Complaint  Patient presents with  . Routine Prenatal Visit    PN2  ____  Patient complaints: Pt was in contact with Dr. Despina HiddenEure on 12/17/16 for a cyst on her right breast around base of nipple.now on Keflex. Hx recurrent drainage Patient reports good fetal movement, denies any bleeding, rupture of membranes,or regular contractions.  Blood pressure 100/70, pulse 72, weight 170 lb 12.8 oz (77.5 kg), last menstrual period 06/22/2016.   Urine results:notable for trace protein otherwise negative refer to the ob flow sheet for FH and FHR, ,                          Physical Examination: General appearance - alert, well appearing, and in no distress and oriented to person, place, and time                                      Abdomen - FH 25 cm                                                         -FHR 150                               Breast:  Around the base of her right nipple there is a draining hair follicle.   Questions were answered. Assessment: LROB G1P0 @ 4565w4d    Folliculitis of right nipple3 oclock  Plan:  Continued routine obstetrical care,   Continue to manually drain area of folliculitis to right nipple and continue abx use.  F/u in 4 weeks for LROB  By signing my name below, I, Diona BrownerJennifer Gorman, attest that this documentation has been prepared under the direction and in the presence of Tilda BurrowFerguson, Eleftheria Taborn V, MD. Electronically Signed: Diona BrownerJennifer Gorman, Medical Scribe. 01/01/17. 10:12 AM.  I personally performed the services described in this documentation, which was SCRIBED in my presence. The recorded information has been reviewed and considered accurate. It has been edited as necessary during review. Tilda BurrowFERGUSON,Tesslyn Baumert V, MD

## 2017-01-01 NOTE — Patient Instructions (Signed)
(  [phone] is the phone number for Pregnancy Classes or hospital tours at [facility].  ° °You will be referred to  [email or web address]   for more information on childbirth classes   °At this site you may register for classes. You may sign up for a waiting list if classes are full. Please SIGN UP FOR THIS!.   When the waiting list becomes long, sometimes new classes can be added. ° ° ° °

## 2017-01-02 LAB — CBC
Hematocrit: 35.9 % (ref 34.0–46.6)
Hemoglobin: 11.5 g/dL (ref 11.1–15.9)
MCH: 27.3 pg (ref 26.6–33.0)
MCHC: 32 g/dL (ref 31.5–35.7)
MCV: 85 fL (ref 79–97)
PLATELETS: 240 10*3/uL (ref 150–379)
RBC: 4.22 x10E6/uL (ref 3.77–5.28)
RDW: 13.4 % (ref 12.3–15.4)
WBC: 6.4 10*3/uL (ref 3.4–10.8)

## 2017-01-02 LAB — GLUCOSE TOLERANCE, 2 HOURS W/ 1HR
GLUCOSE, 1 HOUR: 119 mg/dL (ref 65–179)
GLUCOSE, FASTING: 70 mg/dL (ref 65–91)
Glucose, 2 hour: 114 mg/dL (ref 65–152)

## 2017-01-02 LAB — HIV ANTIBODY (ROUTINE TESTING W REFLEX): HIV SCREEN 4TH GENERATION: NONREACTIVE

## 2017-01-02 LAB — RPR: RPR Ser Ql: NONREACTIVE

## 2017-01-02 LAB — ANTIBODY SCREEN: Antibody Screen: NEGATIVE

## 2017-01-29 ENCOUNTER — Ambulatory Visit (INDEPENDENT_AMBULATORY_CARE_PROVIDER_SITE_OTHER): Payer: Medicaid Other | Admitting: Advanced Practice Midwife

## 2017-01-29 ENCOUNTER — Encounter: Payer: Self-pay | Admitting: Advanced Practice Midwife

## 2017-01-29 VITALS — BP 110/70 | HR 82 | Wt 181.0 lb

## 2017-01-29 DIAGNOSIS — Z3A31 31 weeks gestation of pregnancy: Secondary | ICD-10-CM

## 2017-01-29 DIAGNOSIS — Z3483 Encounter for supervision of other normal pregnancy, third trimester: Secondary | ICD-10-CM

## 2017-01-29 DIAGNOSIS — O1213 Gestational proteinuria, third trimester: Secondary | ICD-10-CM | POA: Diagnosis not present

## 2017-01-29 DIAGNOSIS — Z331 Pregnant state, incidental: Secondary | ICD-10-CM

## 2017-01-29 DIAGNOSIS — Z3403 Encounter for supervision of normal first pregnancy, third trimester: Secondary | ICD-10-CM

## 2017-01-29 DIAGNOSIS — Z1389 Encounter for screening for other disorder: Secondary | ICD-10-CM

## 2017-01-29 LAB — POCT URINALYSIS DIPSTICK
Glucose, UA: NEGATIVE
KETONES UA: NEGATIVE
LEUKOCYTES UA: NEGATIVE
Nitrite, UA: NEGATIVE
PROTEIN UA: 4
RBC UA: NEGATIVE

## 2017-01-29 NOTE — Patient Instructions (Signed)

## 2017-01-29 NOTE — Progress Notes (Signed)
G1P0 5826w4d Estimated Date of Delivery: 03/29/17  Blood pressure 110/70, pulse 82, weight 181 lb (82.1 kg), last menstrual period 06/22/2016.   BP weight and urine results all reviewed and noted. No UTI sx.   Please refer to the obstetrical flow sheet for the fundal height and fetal heart rate documentation:  Patient reports good fetal movement, denies any bleeding and no rupture of membranes symptoms or regular contractions. Patient is without complaints. Declined flu shot, discussed potential for death w/fu while pregnant.  All questions were answered.  Orders Placed This Encounter  Procedures  . POCT urinalysis dipstick    Plan:  Continued routine obstetrical care,   Return in about 2 weeks (around 02/12/2017) for LROB.

## 2017-01-31 LAB — URINE CULTURE: Organism ID, Bacteria: NO GROWTH

## 2017-02-03 ENCOUNTER — Inpatient Hospital Stay (HOSPITAL_COMMUNITY)
Admission: AD | Admit: 2017-02-03 | Discharge: 2017-02-06 | DRG: 774 | Disposition: A | Discharge status: Home / Self Care | Payer: Medicaid Other | Source: Ambulatory Visit | Attending: Family Medicine | Admitting: Family Medicine

## 2017-02-03 ENCOUNTER — Encounter (HOSPITAL_COMMUNITY): Payer: Self-pay

## 2017-02-03 ENCOUNTER — Inpatient Hospital Stay (HOSPITAL_COMMUNITY): Payer: Medicaid Other

## 2017-02-03 DIAGNOSIS — Z87891 Personal history of nicotine dependence: Secondary | ICD-10-CM

## 2017-02-03 DIAGNOSIS — Z6831 Body mass index (BMI) 31.0-31.9, adult: Secondary | ICD-10-CM

## 2017-02-03 DIAGNOSIS — O1414 Severe pre-eclampsia complicating childbirth: Principal | ICD-10-CM | POA: Diagnosis present

## 2017-02-03 DIAGNOSIS — O99214 Obesity complicating childbirth: Secondary | ICD-10-CM | POA: Diagnosis present

## 2017-02-03 DIAGNOSIS — O364XX0 Maternal care for intrauterine death, not applicable or unspecified: Secondary | ICD-10-CM | POA: Diagnosis present

## 2017-02-03 DIAGNOSIS — O99324 Drug use complicating childbirth: Secondary | ICD-10-CM | POA: Diagnosis present

## 2017-02-03 DIAGNOSIS — Z3A32 32 weeks gestation of pregnancy: Secondary | ICD-10-CM

## 2017-02-03 DIAGNOSIS — O1490 Unspecified pre-eclampsia, unspecified trimester: Secondary | ICD-10-CM | POA: Diagnosis present

## 2017-02-03 DIAGNOSIS — O4593 Premature separation of placenta, unspecified, third trimester: Secondary | ICD-10-CM | POA: Diagnosis present

## 2017-02-03 DIAGNOSIS — F1290 Cannabis use, unspecified, uncomplicated: Secondary | ICD-10-CM | POA: Diagnosis present

## 2017-02-03 DIAGNOSIS — E669 Obesity, unspecified: Secondary | ICD-10-CM | POA: Diagnosis present

## 2017-02-03 HISTORY — DX: Scoliosis, unspecified: M41.9

## 2017-02-03 HISTORY — DX: Anxiety disorder, unspecified: F41.9

## 2017-02-03 LAB — COMPREHENSIVE METABOLIC PANEL
ALT: 14 U/L (ref 14–54)
AST: 19 U/L (ref 15–41)
Albumin: 3 g/dL — ABNORMAL LOW (ref 3.5–5.0)
Alkaline Phosphatase: 161 U/L — ABNORMAL HIGH (ref 38–126)
Anion gap: 11 (ref 5–15)
BUN: 15 mg/dL (ref 6–20)
CALCIUM: 9.1 mg/dL (ref 8.9–10.3)
CO2: 22 mmol/L (ref 22–32)
CREATININE: 0.68 mg/dL (ref 0.44–1.00)
Chloride: 101 mmol/L (ref 101–111)
Glucose, Bld: 80 mg/dL (ref 65–99)
Potassium: 3.9 mmol/L (ref 3.5–5.1)
Sodium: 134 mmol/L — ABNORMAL LOW (ref 135–145)
Total Bilirubin: 0.3 mg/dL (ref 0.3–1.2)
Total Protein: 6.3 g/dL — ABNORMAL LOW (ref 6.5–8.1)

## 2017-02-03 LAB — CBC
HCT: 37.5 % (ref 36.0–46.0)
Hemoglobin: 12.6 g/dL (ref 12.0–15.0)
MCH: 28.6 pg (ref 26.0–34.0)
MCHC: 33.6 g/dL (ref 30.0–36.0)
MCV: 85.2 fL (ref 78.0–100.0)
Platelets: 215 10*3/uL (ref 150–400)
RBC: 4.4 MIL/uL (ref 3.87–5.11)
RDW: 13.6 % (ref 11.5–15.5)
WBC: 9 10*3/uL (ref 4.0–10.5)

## 2017-02-03 LAB — PROTEIN / CREATININE RATIO, URINE

## 2017-02-03 LAB — RAPID URINE DRUG SCREEN, HOSP PERFORMED
Amphetamines: NOT DETECTED
BARBITURATES: NOT DETECTED
BENZODIAZEPINES: NOT DETECTED
COCAINE: NOT DETECTED
Opiates: NOT DETECTED
TETRAHYDROCANNABINOL: NOT DETECTED

## 2017-02-03 MED ORDER — LACTATED RINGERS IV SOLN
INTRAVENOUS | Status: DC
  Administered 2017-02-03: 23:00:00 via INTRAVENOUS

## 2017-02-03 MED ORDER — NALBUPHINE HCL 10 MG/ML IJ SOLN
5.0000 mg | INTRAMUSCULAR | Status: DC | PRN
  Administered 2017-02-04: 5 mg via INTRAVENOUS
  Filled 2017-02-03: qty 1

## 2017-02-03 MED ORDER — SOD CITRATE-CITRIC ACID 500-334 MG/5ML PO SOLN: 30.0000 mL | ORAL | Status: DC | PRN

## 2017-02-03 MED ORDER — LACTATED RINGERS IV SOLN
INTRAVENOUS | Status: DC
  Administered 2017-02-03: 125 mL/h via INTRAVENOUS

## 2017-02-03 MED ORDER — LACTATED RINGERS IV SOLN: 500.0000 mL | INTRAVENOUS | Status: DC | PRN

## 2017-02-03 MED ORDER — ACETAMINOPHEN 325 MG PO TABS: 650.0000 mg | ORAL_TABLET | ORAL | Status: DC | PRN

## 2017-02-03 MED ORDER — OXYTOCIN BOLUS FROM INFUSION
500.0000 mL | Freq: Once | INTRAVENOUS | Status: AC
  Administered 2017-02-04: 500 mL via INTRAVENOUS

## 2017-02-03 MED ORDER — LABETALOL HCL 5 MG/ML IV SOLN
20.0000 mg | INTRAVENOUS | Status: DC | PRN
  Administered 2017-02-03 (×2): 20 mg via INTRAVENOUS
  Filled 2017-02-03: qty 4
  Filled 2017-02-03: qty 8

## 2017-02-03 MED ORDER — HYDRALAZINE HCL 20 MG/ML IJ SOLN: 10.0000 mg | Freq: Once | INTRAMUSCULAR | Status: DC | PRN

## 2017-02-03 MED ORDER — OXYCODONE-ACETAMINOPHEN 5-325 MG PO TABS: 1.0000 | ORAL_TABLET | ORAL | Status: DC | PRN

## 2017-02-03 MED ORDER — ONDANSETRON HCL 4 MG/2ML IJ SOLN: 4.0000 mg | Freq: Four times a day (QID) | INTRAMUSCULAR | Status: DC | PRN

## 2017-02-03 MED ORDER — OXYCODONE-ACETAMINOPHEN 5-325 MG PO TABS: 2.0000 | ORAL_TABLET | ORAL | Status: DC | PRN

## 2017-02-03 MED ORDER — MISOPROSTOL 200 MCG PO TABS
50.0000 ug | ORAL_TABLET | ORAL | Status: DC | PRN
  Administered 2017-02-03 – 2017-02-04 (×2): 50 ug via ORAL
  Filled 2017-02-03 (×2): qty 1

## 2017-02-03 MED ORDER — LABETALOL HCL 5 MG/ML IV SOLN
INTRAVENOUS | Status: AC
  Administered 2017-02-03: 20 mg via INTRAVENOUS
  Filled 2017-02-03: qty 4

## 2017-02-03 MED ORDER — MAGNESIUM SULFATE 40 G IN LACTATED RINGERS - SIMPLE
2.0000 g/h | INTRAVENOUS | Status: DC
  Administered 2017-02-03: 2 g/h via INTRAVENOUS
  Filled 2017-02-03: qty 40

## 2017-02-03 MED ORDER — LIDOCAINE HCL (PF) 1 % IJ SOLN
30.0000 mL | INTRAMUSCULAR | Status: DC | PRN
  Filled 2017-02-03: qty 30

## 2017-02-03 MED ORDER — MAGNESIUM SULFATE BOLUS VIA INFUSION
4.0000 g | Freq: Once | INTRAVENOUS | Status: AC
  Administered 2017-02-03: 4 g via INTRAVENOUS
  Filled 2017-02-03: qty 500

## 2017-02-03 MED ORDER — OXYTOCIN 40 UNITS IN LACTATED RINGERS INFUSION - SIMPLE MED
2.5000 [IU]/h | INTRAVENOUS | Status: DC
  Filled 2017-02-03: qty 1000

## 2017-02-03 NOTE — MAU Note (Addendum)
Pt reports vaginal bleeding that started at 8:30pm tonight while she was getting a pedicure. Pt reports some pelvic pressure and cramping. Pt reports good fetal movement tonight. Pt denies HA, vision changes, RUQ pain, but states she has had intermittent HA's over the last couple of days.

## 2017-02-03 NOTE — H&P (Signed)
Jacqueline Barrera is a 21 y.o. female presenting for vaginal bleeding. She states that she started bleeding about 2 hours ago. She denies any pain at this time. She reports that she has felt the baby today. She denies any complications with this pregnancy. She reports that she is seen at [facility] for prenatal care. She denies any complications. She has had headaches off and on this week, but she denies any headache at this time.  OB History    Gravida Para Term Preterm AB Living   1             SAB TAB Ectopic Multiple Live Births                 No past medical history on file. Past Surgical History:  Procedure Laterality Date  . ADENOIDECTOMY    . TONSILLECTOMY AND ADENOIDECTOMY    . WISDOM TOOTH EXTRACTION     Family History: family history includes Hyperlipidemia in her maternal aunt; Hypertension in her maternal grandmother. Social History:  reports that she quit smoking about 3 years ago. Her smoking use included Cigars. She has a 0.30 pack-year smoking history. She has never used smokeless tobacco. She reports that she does not drink alcohol or use drugs.     Maternal Diabetes: No Genetic Screening: Normal Maternal Ultrasounds/Referrals: Normal Fetal Ultrasounds or other Referrals:  None Maternal Substance Abuse:  No Significant Maternal Medications:  None Significant Maternal Lab Results:  None Other Comments:  None  Review of Systems  Constitutional: Negative for chills and fever.  Eyes: Negative for blurred vision.  Gastrointestinal: Positive for abdominal pain.   Maternal Medical History:  Reason for admission: Vaginal bleeding.   Fetal activity: Last perceived fetal movement was within the past 12 hours.    Prenatal complications: no prenatal complications     Blood pressure (!) 185/107, pulse 77, resp. rate 16, last menstrual period 06/22/2016, SpO2 100 %. Maternal Exam:  Abdomen: Patient reports no abdominal tenderness.   Fetal Exam Fetal Monitor Review: Baseline  rate: unable to get FHR with cardio, bedside US could not descern FHR. [place] performing bedside US. Marland Kitchen      Physical Exam  Nursing note and vitals reviewed. Constitutional: She is oriented to person, place, and time. She appears well-developed and well-nourished. No distress.  HENT:  Head: Normocephalic.  Cardiovascular: Normal rate.   Respiratory: Effort normal.  GI: Soft. There is no tenderness.  Neurological: She is alert and oriented to person, place, and time.  Skin: Skin is warm and dry.  Psychiatric: She has a normal mood and affect.   Bedside US: no FHR  Prenatal labs: ABO, Rh: --/--/O POS (04/02 0007) Antibody: Negative (08/15 0931) Rubella: 3.50 (04/11 1502) RPR: Non Reactive (08/15 0931)  HBsAg: Negative (04/11 1502)  HIV:   Non-reactive GBS:   Unknown   Dr. Shawnie Pons aware, and will come talk with patient.   Assessment/Plan: IUFD at [redacted] week gestation Pre-eclampsia with severe features Admit to labor and delivery  Antihypertensives PRN Magnesium    [name] 02/03/2017, 9:50 PM

## 2017-02-04 ENCOUNTER — Encounter (HOSPITAL_COMMUNITY): Payer: Self-pay | Admitting: Anesthesiology

## 2017-02-04 ENCOUNTER — Inpatient Hospital Stay (HOSPITAL_COMMUNITY): Payer: Medicaid Other | Admitting: Anesthesiology

## 2017-02-04 DIAGNOSIS — O4593 Premature separation of placenta, unspecified, third trimester: Secondary | ICD-10-CM

## 2017-02-04 DIAGNOSIS — O1414 Severe pre-eclampsia complicating childbirth: Secondary | ICD-10-CM

## 2017-02-04 DIAGNOSIS — O364XX0 Maternal care for intrauterine death, not applicable or unspecified: Secondary | ICD-10-CM | POA: Diagnosis present

## 2017-02-04 DIAGNOSIS — O4590 Premature separation of placenta, unspecified, unspecified trimester: Secondary | ICD-10-CM

## 2017-02-04 DIAGNOSIS — Z3A32 32 weeks gestation of pregnancy: Secondary | ICD-10-CM

## 2017-02-04 LAB — TYPE AND SCREEN
ABO/RH(D): O POS
ANTIBODY SCREEN: NEGATIVE

## 2017-02-04 LAB — CBC
HCT: 32.2 % — ABNORMAL LOW (ref 36.0–46.0)
HEMOGLOBIN: 10.9 g/dL — AB (ref 12.0–15.0)
MCH: 28.9 pg (ref 26.0–34.0)
MCHC: 33.9 g/dL (ref 30.0–36.0)
MCV: 85.4 fL (ref 78.0–100.0)
Platelets: 185 10*3/uL (ref 150–400)
RBC: 3.77 MIL/uL — AB (ref 3.87–5.11)
RDW: 13.8 % (ref 11.5–15.5)
WBC: 12.2 10*3/uL — ABNORMAL HIGH (ref 4.0–10.5)

## 2017-02-04 LAB — ABO/RH: ABO/RH(D): O POS

## 2017-02-04 LAB — RPR: RPR: NONREACTIVE

## 2017-02-04 MED ORDER — OXYCODONE HCL 5 MG PO TABS
5.0000 mg | ORAL_TABLET | Freq: Four times a day (QID) | ORAL | Status: DC | PRN
  Administered 2017-02-04: 5 mg via ORAL
  Filled 2017-02-04: qty 1

## 2017-02-04 MED ORDER — LACTATED RINGERS IV SOLN: 500.0000 mL | Freq: Once | INTRAVENOUS | Status: DC

## 2017-02-04 MED ORDER — ONDANSETRON HCL 4 MG PO TABS: 4.0000 mg | ORAL_TABLET | ORAL | Status: DC | PRN

## 2017-02-04 MED ORDER — IBUPROFEN 600 MG PO TABS
600.0000 mg | ORAL_TABLET | Freq: Four times a day (QID) | ORAL | Status: DC
  Administered 2017-02-04 – 2017-02-05 (×6): 600 mg via ORAL
  Filled 2017-02-04 (×9): qty 1

## 2017-02-04 MED ORDER — FENTANYL CITRATE (PF) 100 MCG/2ML IJ SOLN
100.0000 ug | INTRAMUSCULAR | Status: DC | PRN
  Administered 2017-02-04: 100 ug via INTRAVENOUS
  Filled 2017-02-04: qty 2

## 2017-02-04 MED ORDER — DIPHENHYDRAMINE HCL 50 MG/ML IJ SOLN: 12.5000 mg | INTRAMUSCULAR | Status: DC | PRN

## 2017-02-04 MED ORDER — PHENYLEPHRINE 40 MCG/ML (10ML) SYRINGE FOR IV PUSH (FOR BLOOD PRESSURE SUPPORT)
80.0000 ug | PREFILLED_SYRINGE | INTRAVENOUS | Status: DC | PRN
  Filled 2017-02-04: qty 5

## 2017-02-04 MED ORDER — PHENYLEPHRINE 40 MCG/ML (10ML) SYRINGE FOR IV PUSH (FOR BLOOD PRESSURE SUPPORT)
80.0000 ug | PREFILLED_SYRINGE | INTRAVENOUS | Status: DC | PRN
  Filled 2017-02-04: qty 5
  Filled 2017-02-04: qty 10

## 2017-02-04 MED ORDER — BENZOCAINE-MENTHOL 20-0.5 % EX AERO: 1.0000 "application " | INHALATION_SPRAY | CUTANEOUS | Status: DC | PRN

## 2017-02-04 MED ORDER — SIMETHICONE 80 MG PO CHEW: 80.0000 mg | CHEWABLE_TABLET | ORAL | Status: DC | PRN

## 2017-02-04 MED ORDER — LIDOCAINE HCL (PF) 1 % IJ SOLN
INTRAMUSCULAR | Status: DC | PRN
  Administered 2017-02-04: 4 mL
  Administered 2017-02-04: 4 mL via EPIDURAL

## 2017-02-04 MED ORDER — FENTANYL 2.5 MCG/ML BUPIVACAINE 1/10 % EPIDURAL INFUSION (WH - ANES)
14.0000 mL/h | INTRAMUSCULAR | Status: DC | PRN
Start: 2017-02-04 — End: 2017-02-04
  Administered 2017-02-04: 14 mL/h via EPIDURAL
  Filled 2017-02-04: qty 100

## 2017-02-04 MED ORDER — WITCH HAZEL-GLYCERIN EX PADS: 1.0000 "application " | MEDICATED_PAD | CUTANEOUS | Status: DC | PRN

## 2017-02-04 MED ORDER — COCONUT OIL OIL: 1.0000 "application " | TOPICAL_OIL | Status: DC | PRN

## 2017-02-04 MED ORDER — LACTATED RINGERS IV SOLN
INTRAVENOUS | Status: DC
  Administered 2017-02-04: 14:00:00 via INTRAVENOUS

## 2017-02-04 MED ORDER — EPHEDRINE 5 MG/ML INJ
10.0000 mg | INTRAVENOUS | Status: DC | PRN
  Filled 2017-02-04: qty 2

## 2017-02-04 MED ORDER — PRENATAL MULTIVITAMIN CH
1.0000 | ORAL_TABLET | Freq: Every day | ORAL | Status: DC
  Administered 2017-02-04 – 2017-02-05 (×2): 1 via ORAL
  Filled 2017-02-04 (×2): qty 1

## 2017-02-04 MED ORDER — ZOLPIDEM TARTRATE 5 MG PO TABS
5.0000 mg | ORAL_TABLET | Freq: Every evening | ORAL | Status: DC | PRN
  Filled 2017-02-04: qty 1

## 2017-02-04 MED ORDER — MAGNESIUM SULFATE 40 G IN LACTATED RINGERS - SIMPLE
2.0000 g/h | INTRAVENOUS | Status: DC
  Administered 2017-02-04: 2 g/h via INTRAVENOUS
  Filled 2017-02-04 (×2): qty 500
  Filled 2017-02-04: qty 40

## 2017-02-04 MED ORDER — ONDANSETRON HCL 4 MG/2ML IJ SOLN
4.0000 mg | INTRAMUSCULAR | Status: DC | PRN
Start: 2017-02-04 — End: 2017-02-06

## 2017-02-04 MED ORDER — DIBUCAINE 1 % RE OINT: 1.0000 "application " | TOPICAL_OINTMENT | RECTAL | Status: DC | PRN

## 2017-02-04 MED ORDER — ACETAMINOPHEN 325 MG PO TABS
650.0000 mg | ORAL_TABLET | ORAL | Status: DC | PRN
  Administered 2017-02-04 (×2): 650 mg via ORAL
  Filled 2017-02-04 (×3): qty 2

## 2017-02-04 MED ORDER — DIPHENHYDRAMINE HCL 25 MG PO CAPS: 25.0000 mg | ORAL_CAPSULE | Freq: Four times a day (QID) | ORAL | Status: DC | PRN

## 2017-02-04 MED ORDER — TETANUS-DIPHTH-ACELL PERTUSSIS 5-2.5-18.5 LF-MCG/0.5 IM SUSP: 0.5000 mL | Freq: Once | INTRAMUSCULAR | Status: DC

## 2017-02-04 MED ORDER — SENNOSIDES-DOCUSATE SODIUM 8.6-50 MG PO TABS
2.0000 | ORAL_TABLET | ORAL | Status: DC
  Administered 2017-02-04: 2 via ORAL
  Filled 2017-02-04: qty 2

## 2017-02-04 NOTE — Anesthesia Preprocedure Evaluation (Addendum)
Anesthesia Evaluation  Patient identified by MRN, date of birth, ID band Patient awake    Reviewed: Allergy & Precautions, Patient's Chart, lab work & pertinent test results, reviewed documented beta blocker date and time   Airway Mallampati: II  TM Distance: >3 FB Neck ROM: Full    Dental no notable dental hx. (+) Teeth Intact   Pulmonary former smoker,    Pulmonary exam normal breath sounds clear to auscultation       Cardiovascular hypertension, Pt. on medications Normal cardiovascular exam Rhythm:Regular Rate:Normal     Neuro/Psych PSYCHIATRIC DISORDERS Anxiety negative neurological ROS     GI/Hepatic Neg liver ROS, GERD  ,  Endo/Other  Obesity  Renal/GU negative Renal ROS  negative genitourinary   Musculoskeletal negative musculoskeletal ROS (+)   Abdominal (+) + obese,   Peds  Hematology negative hematology ROS (+)   Anesthesia Other Findings   Reproductive/Obstetrics (+) Pregnancy IUFD 32 3/7 weeks HTN Pre eclampsia on MgSO4                            Anesthesia Physical Anesthesia Plan  ASA: III  Anesthesia Plan: Epidural   Post-op Pain Management:    Induction:   PONV Risk Score and Plan:   Airway Management Planned: Natural Airway  Additional Equipment:   Intra-op Plan:   Post-operative Plan:   Informed Consent: I have reviewed the patients History and Physical, chart, labs and discussed the procedure including the risks, benefits and alternatives for the proposed anesthesia with the patient or authorized representative who has indicated his/her understanding and acceptance.     Plan Discussed with: Anesthesiologist  Anesthesia Plan Comments:         Anesthesia Quick Evaluation

## 2017-02-04 NOTE — Progress Notes (Signed)
Wants to keep infant at bedside.  Cuddle Cot in use.

## 2017-02-04 NOTE — Progress Notes (Signed)
Coping well with supportive family at bedside. Desiring stronger pain medication than Nubain - Fentanyl 100 mcg IV q2hrs PRN ordered. Bps improved to mild range with IV labetolol per protocol; on magnesium. Proceed with Cytotec q4 hrs for cervical ripening - last exam 2355 2/80/-3. Burna Cash, MD Family Medicine Resident, PGY-3 02/04/2017 2:13 AM

## 2017-02-04 NOTE — Anesthesia Postprocedure Evaluation (Signed)
Anesthesia Post Note  Patient: Jacqueline Barrera  Procedure(s) Performed: * No procedures listed *     Patient location during evaluation: [facility] Unit Anesthesia Type: Epidural Level of consciousness: awake, awake and alert, oriented and patient cooperative Pain management: pain level controlled Vital Signs Assessment: post-procedure vital signs reviewed and stable Respiratory status: nonlabored ventilation, spontaneous breathing and respiratory function stable Cardiovascular status: stable Postop Assessment: no headache, no backache, no apparent nausea or vomiting and patient able to bend at knees Anesthetic complications: no    Last Vitals:  Vitals:   02/04/17 1123 02/04/17 1243  BP:    Pulse:    Resp: 16 17  Temp:    SpO2:      Last Pain:  Vitals:   02/04/17 1244  TempSrc:   PainSc: 3    Pain Goal: Patients Stated Pain Goal: 5 (02/04/17 0048)               [name] L

## 2017-02-04 NOTE — Anesthesia Procedure Notes (Signed)
Epidural Patient location during procedure: OB Start time: 02/04/2017 3:42 AM  Staffing Anesthesiologist: Mal Amabile Performed: anesthesiologist   Preanesthetic Checklist Completed: patient identified, site marked, surgical consent, pre-op evaluation, timeout performed, IV checked, risks and benefits discussed and monitors and equipment checked  Epidural Patient position: sitting Prep: site prepped and draped and DuraPrep Patient monitoring: continuous pulse ox and blood pressure Approach: midline Location: L3-L4 Injection technique: LOR air  Needle:  Needle type: Tuohy  Needle gauge: 17 G Needle length: 9 cm and 9 Needle insertion depth: 6 cm Catheter type: closed end flexible Catheter size: 19 Gauge Catheter at skin depth: 11 cm Test dose: negative and Other  Assessment Events: blood not aspirated, injection not painful, no injection resistance, negative IV test and no paresthesia  Additional Notes Patient identified. Risks and benefits discussed including failed block, incomplete  Pain control, post dural puncture headache, nerve damage, paralysis, blood pressure Changes, nausea, vomiting, reactions to medications-both toxic and allergic and post Partum back pain. All questions were answered. Patient expressed understanding and wished to proceed. Sterile technique was used throughout procedure. Epidural site was Dressed with sterile barrier dressing. No paresthesias, signs of intravascular injection Or signs of intrathecal spread were encountered. Attempt x 2. Difficult due to poor positioning. Patient was more comfortable after the epidural was dosed. Please see RN's note for documentation of vital signs and FHR which are stable.

## 2017-02-05 LAB — CBC
HEMATOCRIT: 26 % — AB (ref 36.0–46.0)
HEMOGLOBIN: 9.1 g/dL — AB (ref 12.0–15.0)
MCH: 29.9 pg (ref 26.0–34.0)
MCHC: 35 g/dL (ref 30.0–36.0)
MCV: 85.5 fL (ref 78.0–100.0)
PLATELETS: 156 10*3/uL (ref 150–400)
RBC: 3.04 MIL/uL — AB (ref 3.87–5.11)
RDW: 14.1 % (ref 11.5–15.5)
WBC: 9.9 10*3/uL (ref 4.0–10.5)

## 2017-02-05 LAB — COMPREHENSIVE METABOLIC PANEL
ALT: 22 U/L (ref 14–54)
AST: 31 U/L (ref 15–41)
Albumin: 2.7 g/dL — ABNORMAL LOW (ref 3.5–5.0)
Alkaline Phosphatase: 106 U/L (ref 38–126)
Anion gap: 7 (ref 5–15)
BUN: 7 mg/dL (ref 6–20)
CO2: 25 mmol/L (ref 22–32)
Calcium: 6 mg/dL — CL (ref 8.9–10.3)
Chloride: 104 mmol/L (ref 101–111)
Creatinine, Ser: 0.74 mg/dL (ref 0.44–1.00)
Glucose, Bld: 84 mg/dL (ref 65–99)
POTASSIUM: 3.6 mmol/L (ref 3.5–5.1)
Sodium: 136 mmol/L (ref 135–145)
TOTAL PROTEIN: 5.7 g/dL — AB (ref 6.5–8.1)
Total Bilirubin: 0.5 mg/dL (ref 0.3–1.2)

## 2017-02-05 MED ORDER — SALINE SPRAY 0.65 % NA SOLN
1.0000 | NASAL | Status: DC | PRN
  Administered 2017-02-05: 1 via NASAL
  Filled 2017-02-05: qty 44

## 2017-02-05 MED ORDER — SENNOSIDES-DOCUSATE SODIUM 8.6-50 MG PO TABS: 2.0000 | ORAL_TABLET | Freq: Every evening | ORAL | Status: DC | PRN

## 2017-02-05 MED ORDER — AMLODIPINE BESYLATE 10 MG PO TABS
10.0000 mg | ORAL_TABLET | Freq: Every day | ORAL | Status: DC
  Administered 2017-02-05 – 2017-02-06 (×2): 10 mg via ORAL
  Filled 2017-02-05 (×2): qty 1

## 2017-02-05 MED ORDER — LABETALOL HCL 200 MG PO TABS: 200.0000 mg | ORAL_TABLET | Freq: Two times a day (BID) | ORAL | Status: DC

## 2017-02-05 NOTE — Progress Notes (Signed)
Dr. Macon Large notified of patients BP's.  Stated she will place order for Norvacs.

## 2017-02-05 NOTE — Progress Notes (Signed)
Daily Postpartum Note  Admission Date: 02/03/2017 Current Date: 02/05/2017 10:13 AM  Jacqueline Barrera is a 21 y.o. G1P0100 PPD#1 s/p SVD/IP @ 32wks for IUFD and pre-eclampsia with severe features.  Pregnancy complicated by: Patient Active Problem List   Diagnosis Date Noted  . SVD (spontaneous vaginal delivery) 02/04/2017  . IUFD at 20 weeks or more of gestation 02/04/2017  . Placental abruption in third trimester 02/04/2017  . Pre-eclampsia 02/03/2017  . Purulent discharge from right nipple 01/01/2017  . Supervision of normal first pregnancy 08/28/2016  . Marijuana use 08/28/2016  . Chlamydia 02/22/2014    Overnight/24hr events:  Pt just came off Mg  Subjective:  No s/s of pre-eclampsia, minimal lochia. Pt feels well  Objective:    Current Vital Signs 24h Vital Sign Ranges  T 97.9 F (36.6 C) Temp  Avg: 98.2 F (36.8 C)  Min: 97.9 F (36.6 C)  Max: 98.6 F (37 C)  BP (!) 145/101 BP  Min: 131/75  Max: 154/104  HR (!) 108 Pulse  Avg: 94.6  Min: 89  Max: 108  RR 18 Resp  Avg: 16.6  Min: 15  Max: 20  SaO2 100 % Not Delivered SpO2  Avg: 99.6 %  Min: 99 %  Max: 100 %       24 Hour I/O Current Shift I/O  Time Ins Outs 09/18 0701 - 09/19 0700 In: 7734.3 [P.O.:3770; I.V.:3964.3] Out: 1750 [Urine:1750] No intake/output data recorded.   Patient Vitals for the past 24 hrs:  BP Temp Temp src Pulse Resp SpO2  02/05/17 1000 (!) 145/101 - - (!) 108 18 -  02/05/17 0856 (!) 154/104 97.9 F (36.6 C) Oral 94 16 100 %  02/05/17 0800 - - - - 16 -  02/05/17 0600 - - - - 16 99 %  02/05/17 0500 - - - - 16 -  02/05/17 0400 - - - - 16 100 %  02/05/17 0325 (!) 144/81 97.9 F (36.6 C) Oral 92 17 100 %  02/05/17 0300 - - - - 16 -  02/05/17 0200 - - - - 16 99 %  02/05/17 0110 - - - - 15 99 %  02/05/17 0009 - - - - 16 100 %  02/05/17 0002 131/75 98.3 F (36.8 C) Oral 91 17 99 %  02/04/17 2300 - - - - 18 -  02/04/17 2200 - - - - 16 -  02/04/17 2100 - - - - 20 -  02/04/17 2000 - - - - 18  -  02/04/17 1930 (!) 150/91 98.6 F (37 C) Oral 95 18 100 %  02/04/17 1802 - - - - 18 -  02/04/17 1710 - - - - 16 -  02/04/17 1546 135/87 98.5 F (36.9 C) Oral 89 16 100 %  02/04/17 1500 - - - - 16 -  02/04/17 1430 - - - - 16 -  02/04/17 1345 - - - - 16 -  02/04/17 1243 - - - - 17 -  02/04/17 1123 - - - - 16 -  02/04/17 1045 133/86 - - 93 16 100 %    Physical exam: General: Well nourished, well developed female in no acute distress. Abdomen: FF below the umbilicus. nttp Cardiovascular: S1, S2 normal, no murmur, rub or gallop, regular rate and rhythm Respiratory: CTAB Extremities: no clubbing, cyanosis or edema Skin: Warm and dry.   Medications: Current Facility-Administered Medications  Medication Dose Route Frequency Provider Last Rate Last Dose  . acetaminophen (TYLENOL) tablet 650  mg  650 mg Oral Q4H PRN Burnard Leigh, MD   650 mg at 02/04/17 2207  . benzocaine-Menthol (DERMOPLAST) 20-0.5 % topical spray 1 application  1 application Topical PRN Burnard Leigh, MD      . coconut oil  1 application Topical PRN Burnard Leigh, MD      . witch hazel-glycerin (TUCKS) pad 1 application  1 application Topical PRN Burnard Leigh, MD       And  . dibucaine (NUPERCAINAL) 1 % rectal ointment 1 application  1 application Rectal PRN Burnard Leigh, MD      . diphenhydrAMINE (BENADRYL) capsule 25 mg  25 mg Oral Q6H PRN Burnard Leigh, MD      . ibuprofen (ADVIL,MOTRIN) tablet 600 mg  600 mg Oral Q6H Diggs, Eileen Stanford, MD   600 mg at 02/05/17 0600  . ondansetron (ZOFRAN) tablet 4 mg  4 mg Oral Q4H PRN Burnard Leigh, MD       Or  . ondansetron (ZOFRAN) injection 4 mg  4 mg Intravenous Q4H PRN Burnard Leigh, MD      . oxyCODONE (Oxy IR/ROXICODONE) immediate release tablet 5 mg  5 mg Oral Q6H PRN Kathrynn Running, MD   5 mg at 02/04/17 1952  . prenatal multivitamin tablet 1 tablet  1 tablet Oral Q1200 Burnard Leigh, MD   1 tablet at 02/04/17 1240  . senna-docusate (Senokot-S) tablet 2 tablet  2 tablet Oral  QHS PRN [place] Bing, MD      . simethicone (MYLICON) chewable tablet 80 mg  80 mg Oral PRN Burnard Leigh, MD      . Tdap (BOOSTRIX) injection 0.5 mL  0.5 mL Intramuscular Once Burnard Leigh, MD      . zolpidem (AMBIEN) tablet 5 mg  5 mg Oral QHS PRN Burnard Leigh, MD        Labs:   Recent Labs Lab 02/03/17 2152 02/04/17 0613 02/05/17 0522  WBC 9.0 12.2* 9.9  HGB 12.6 10.9* 9.1*  HCT 37.5 32.2* 26.0*  PLT 215 185 156     Recent Labs Lab 02/03/17 2152  NA 134*  K 3.9  CL 101  CO2 22  BUN 15  CREATININE 0.68  CALCIUM 9.1  PROT 6.3*  BILITOT 0.3  ALKPHOS 161*  ALT 14  AST 19  GLUCOSE 80     Radiology: no new imaging  Assessment & Plan:  Pt stable *ZO:XWRUEAV all goals *severe pre-eclampsia: normal to just mild range BPs this morning when I saw her but as I write my note now, her BPs are trending up. Talked to RN and she has already spoken to dr anyanwu who will order norvasc *PPx: SCDs, OOB ad lib *FEN/GI:regular diet *Dispo: likely tomorrow. 1wk bp and mood check already sent to [facility].   Cornelia Copa MD Attending Center for [facility])

## 2017-02-05 NOTE — Procedures (Signed)
CRITICAL VALUE ALERT  Critical Value:  **Results for JAMAYA, SLEETH (MRN [id number]) as of 02/05/2017 11:32  Ref. Range 02/05/2017 10:14  Calcium Latest Ref Range: 8.9 - 10.3 mg/dL 6.0 (LL)  *  Date & Time Notied:  02/05/17 @ 1135  Provider Notified: Dr. Macon Large  Orders Received/Actions taken: no orders received.

## 2017-02-06 ENCOUNTER — Telehealth: Payer: Self-pay | Admitting: Obstetrics & Gynecology

## 2017-02-06 DIAGNOSIS — O364XX0 Maternal care for intrauterine death, not applicable or unspecified: Secondary | ICD-10-CM

## 2017-02-06 DIAGNOSIS — O1414 Severe pre-eclampsia complicating childbirth: Secondary | ICD-10-CM

## 2017-02-06 DIAGNOSIS — O4593 Premature separation of placenta, unspecified, third trimester: Secondary | ICD-10-CM

## 2017-02-06 DIAGNOSIS — Z3A32 32 weeks gestation of pregnancy: Secondary | ICD-10-CM

## 2017-02-06 MED ORDER — AMLODIPINE BESYLATE 10 MG PO TABS: 10.0000 mg | ORAL_TABLET | Freq: Every day | ORAL | 1 refills | Status: DC

## 2017-02-06 MED ORDER — IBUPROFEN 600 MG PO TABS: 600.0000 mg | ORAL_TABLET | Freq: Four times a day (QID) | ORAL | 0 refills | Status: DC

## 2017-02-06 NOTE — Telephone Encounter (Signed)
Voice mail not set up @ 4:20 pm. No other number available. JSY

## 2017-02-06 NOTE — Discharge Instructions (Signed)
Vaginal Delivery, Care After °Refer to this sheet in the next few weeks. These instructions provide you with information on caring for yourself after your delivery. Your health care provider may also give you more specific instructions. Your treatment has been planned according to current medical practices, but problems sometimes occur. Call your health care provider if you have any problems or questions after your delivery. °What to expect after your delivery °After your delivery, it is typical to have the following: °· You may feel pain in the vaginal area for several days after delivery. If you had an incision or a vaginal tear, the area will probably continue to be tender to the touch for several weeks. °· You may feel very fatigued after a vaginal delivery. °· You may have vaginal bleeding and discharge that will start out red, then become pink, then yellow, then white. Altogether, this usually lasts for about 6 weeks. °· The combination of having lost your baby and changing hormones from the delivery can make you feel very sad. You may also experience emotions that change very quickly. Some of the emotions people often notice after loss include: °? Anger. °? Denial. °? Guilt. °? Sorrow. °? Depression. °? Grief. °? Relationship problems. ° °Follow these instructions at home: °· Consider seeking support for your loss. Some forms of support that you might consider include your religious leader, friends, family, a professional counselor, or a bereavement support group. °· Take medicines only as directed by your health care provider. °· Continue to use good perineal care. Good perineal care includes: °? Wiping your perineum from front to back. °? Keeping your perineum clean. °· Do not use tampons or douche until your health care provider says it is okay. °· Shower, wash your hair, and take tub baths as directed by your health care provider. °· Wear a well-fitting bra that provides breast support. °· Drink enough  fluids to keep your urine clear or pale yellow. °· Eat healthy foods. °· Eat high-fiber foods every day, such as whole grain cereals and breads, brown rice, beans, and fresh fruits and vegetables. These foods may help prevent or relieve constipation. °· Follow your health care provider's directions about resuming activities such as climbing stairs, driving, lifting, exercising, or traveling. °· Increase your activities gradually. °· Talk to your health care provider about resuming sexual activities. This depends on your risk of infection, your rate of healing, and your comfort and desire to resume sexual activity. °· Try to have someone help you with your household activities for at least a few days after you leave the hospital. °· Rest as much as possible. °· Keep all of your scheduled postpartum appointments. It is very important to keep your scheduled follow-up appointments. At these appointments, your health care provider will be checking to make sure that you are healing physically and emotionally. °· Do not drink alcohol, especially if you are taking medicine to relieve pain. °· Do not use any tobacco products including cigarettes, chewing tobacco, or electronic cigarettes. If you need help quitting, ask your health care provider. °· Do not use illegal drugs. °Contact a health care provider if: °· You feel sad or depressed. °· You have thoughts of hurting yourself. °· You are having trouble eating or sleeping. °· You cannot enjoy the things in life you have previously enjoyed. °· You are passing large clots from your vagina. Save any clots to show your health care provider. °· You have a bad smelling discharge from your vagina. °·   You have trouble urinating. °· You are urinating frequently. °· You have pain when you urinate. °· You have a change in your bowel movements. °· You have increasing redness, pain, or swelling near your incision or vaginal tear. °· You have pus draining from your incision or vaginal  tear. °· Your incision or vaginal tear is separating. °· You have painful, hard, or reddened breasts. °· You have a severe headache. °· You have blurred vision or see spots. °· You are dizzy or light-headed. °· You have a rash. °· You have nausea or vomiting. °· You have not had a menstrual period by the 12th week after delivery. °· You have a fever. °Get help right away if: °· You are concerned that you may hurt yourself or you are considering suicide. °· You have persistent pain. °· You have chest pain. °· You have shortness of breath. °· You faint. °· You have leg pain. °· You have stomach pain. °· Your vaginal bleeding saturates two or more sanitary pads in 1 hour. °This information is not intended to replace advice given to you by your health care provider. Make sure you discuss any questions you have with your health care provider. °Document Released: 09/20/2013 Document Revised: 10/12/2015 Document Reviewed: 06/24/2013 °Elsevier Interactive Patient Education © 2018 [employer] ° °

## 2017-02-06 NOTE — Discharge Summary (Signed)
Physician Discharge Summary  Patient ID: Jacqueline Barrera MRN: [id number] DOB/AGE: 07-11-1995 21 y.o.  Admit date: 02/03/2017 Discharge date: 02/06/2017  Admission Diagnoses: Principal Problem:   SVD (spontaneous vaginal delivery) Active Problems:   Pre-eclampsia   IUFD at 20 weeks or more of gestation   Placental abruption in third trimester  Discharge Diagnoses:  Principal Problem:   SVD (spontaneous vaginal delivery) Active Problems:   Pre-eclampsia   IUFD at 20 weeks or more of gestation   Placental abruption in third trimester   Discharged Condition: stable  Hospital Course: Patient was admitted with vaginal bleeding and was diagnosed with an abruption and a fetal loss at the time of admission.  Her blood pressure was also significantly elevated and she was diagnosed with severe prep preeclampsia as well.  She underwent induction of labor with Cytotec and delivered a nonviable female infant at 0 544 weighing 3 pounds and 12 ounces on 02/04/2017 Postpartum she underwent magnesium sulfate prophylaxis She had to be placed on Norvasc blood pressure management She was discharged on the morning of postpartum day #2 in good stable condition I talked to her extensively about postpartum depression depression in the setting of a fetal loss We will see her in the office on week for most blood pressure check and to see how she's coping with the loss  Consults:   Significant Diagnostic Studies: labs:   Treatments: Induction of labor and delivery and magnesium prophylaxis  Discharge Exam: Blood pressure 114/76, pulse 90, temperature 99.1 F (37.3 C), temperature source Oral, resp. rate 18, height  (1.626 m), weight 181 lb (82.1 kg), last menstrual period 06/22/2016, SpO2 100 %. General appearance: alert, cooperative and no distress GI: soft, non-tender; bowel sounds normal; no masses,  no organomegaly  Disposition: 01-Home or Self Care  Discharge Instructions    Activity as  tolerated    Complete by:  As directed    Call MD for:  persistant nausea and vomiting    Complete by:  As directed    Call MD for:  severe uncontrolled pain    Complete by:  As directed    Call MD for:  temperature >100.4    Complete by:  As directed    Diet - low sodium heart healthy    Complete by:  As directed    Driving restriction     Complete by:  As directed    Avoid driving for at least 1 weeks.   Lifting restrictions    Complete by:  As directed    Weight restriction of 20 lbs.   No wound care    Complete by:  As directed      Allergies as of 02/06/2017      Reactions   Other Nausea And Vomiting   [email or web address]      Medication List    TAKE these medications   amLODipine 10 MG tablet Commonly known as:  NORVASC Take 1 tablet (10 mg total) by mouth daily.   flintstones complete 60 MG chewable tablet Chew 1 tablet by mouth daily.   ibuprofen 600 MG tablet Commonly known as:  ADVIL,MOTRIN Take 1 tablet (600 mg total) by mouth every 6 (six) hours.            Discharge Care Instructions        Start     Ordered   02/06/17 0000  amLODipine (NORVASC) 10 MG tablet  Daily     02/06/17 0846   02/06/17 0000  ibuprofen (ADVIL,MOTRIN)  600 MG tablet  Every 6 hours     02/06/17 0846   02/06/17 0000  Diet - low sodium heart healthy     02/06/17 0846   02/06/17 0000  Call MD for:  temperature >100.4     02/06/17 0846   02/06/17 0000  Call MD for:  persistant nausea and vomiting     02/06/17 0846   02/06/17 0000  Call MD for:  severe uncontrolled pain     02/06/17 0846   02/06/17 0000  Activity as tolerated     02/06/17 0846   02/06/17 0000  Lifting restrictions    Comments:  Weight restriction of 20 lbs.   02/06/17 0846   02/06/17 0000  Driving restriction     Comments:  Avoid driving for at least 1 weeks.   02/06/17 0846   02/06/17 0000  No wound care     02/06/17 0846     Follow-up Information    Lazaro Arms, MD Follow up on 02/13/2017.    Specialties:  Obstetrics and Gynecology, Radiology Contact information: [address] [place] [address] [phone]           Signed: Lazaro Arms 02/06/2017, 8:47 AM

## 2017-02-07 NOTE — Telephone Encounter (Signed)
Voice mail not set up @ 11:42 am. No other number available. Dr. Despina Hidden wants pt out of work until further notice. Keep appt for next week. JSY

## 2017-02-10 NOTE — Telephone Encounter (Signed)
Spoke with pt. Pt didn't work this weekend and is not scheduled to work until October. Advised if she needs a note from [place], let us know. Advised to keep appt for Sept 27 @ 10 am. Pt voiced understanding. JSY

## 2017-02-13 ENCOUNTER — Ambulatory Visit (INDEPENDENT_AMBULATORY_CARE_PROVIDER_SITE_OTHER): Payer: 59 | Admitting: Obstetrics and Gynecology

## 2017-02-13 ENCOUNTER — Encounter: Payer: Self-pay | Admitting: Obstetrics and Gynecology

## 2017-02-13 VITALS — BP 120/80 | HR 88 | Wt 168.2 lb

## 2017-02-13 DIAGNOSIS — O9089 Other complications of the puerperium, not elsewhere classified: Secondary | ICD-10-CM

## 2017-02-13 DIAGNOSIS — O135 Gestational [pregnancy-induced] hypertension without significant proteinuria, complicating the puerperium: Secondary | ICD-10-CM | POA: Diagnosis not present

## 2017-02-13 NOTE — Progress Notes (Signed)
Patient ID: Jacqueline Barrera, female   DOB: January 30, 1996, 21 y.o.   MRN: [id number]   Subjective:  Jacqueline Barrera is a 21 y.o. female who presents for a 9 day postpartum visit s/p vaginal delivery. Patient concerns: Pt reports intermittent HA. She used to take Imitrex for migraines. Otherwise pt denies any other sx or complaints at this time.  Prenatal and intrapartum course notable for IUFD, preeclampsia    Patient is not sexually active.   The following portions of the patient's history were reviewed and updated as appropriate: allergies, current medications, past family history, past medical history, past surgical history and problem list.  Review of Systems    See Subjective, otherwise negative ROS.  Objective:  BP 120/80    Pulse 88    Wt 168 lb 3.2 oz (76.3 kg)    LMP 06/22/2016 (Approximate)    BMI 28.87 kg/m   General:  alert, cooperative and no distress     Lungs: clear to auscultation bilaterally  Heart:  regular rate and rhythm, S1, S2 normal, no murmur  Abdomen: soft, non-tender; bowel sounds normal; no masses,  no organomegaly   Vulva:  normal  Vagina: normal vagina  Cervix:  normal  Corpus: normal size, contour, position, consistency, mobility, non-tender  Adnexa:  normal adnexa          Assessment:  1. postpartum exam.  2. Contraception: not discussed.  3. Discussed when she is able to return to work  Plan:  1. Pt is to monitor BP everyday x 2 weeks . 2. Take prenatal vitamins for at least 6 weeks.  3. F/u 3 weeks postpartum 4. Pt will go back to work in 4 weeks  By signing my name below, I, Diona Browner, attest that this documentation has been prepared under the direction and in the presence of Tilda Burrow, MD. Electronically Signed: Diona Browner, Medical Scribe. 02/13/17. 11:28 AM.  I personally performed the services described in this documentation, which was SCRIBED in my presence. The recorded information has been reviewed and considered accurate. It  has been edited as necessary during review. Tilda Burrow, MD

## 2017-02-18 NOTE — Progress Notes (Signed)
See notes signed by me under scribe Diona Browner.

## 2017-02-26 ENCOUNTER — Other Ambulatory Visit: Payer: Self-pay | Admitting: *Deleted

## 2017-02-26 DIAGNOSIS — H9212 Otorrhea, left ear: Secondary | ICD-10-CM

## 2017-03-06 ENCOUNTER — Encounter: Payer: Self-pay | Admitting: *Deleted

## 2017-03-06 ENCOUNTER — Ambulatory Visit: Payer: Medicaid Other | Admitting: Advanced Practice Midwife

## 2017-03-11 ENCOUNTER — Telehealth: Payer: Self-pay | Admitting: *Deleted

## 2017-03-11 ENCOUNTER — Ambulatory Visit: Payer: Medicaid Other | Admitting: Advanced Practice Midwife

## 2017-03-11 NOTE — Telephone Encounter (Signed)
Pt called stating that she delivered on 02/04/17 and had been bleeding since then. She states that 2 weeks ago she stopped bleeding. She states that she and her partner had intercourse and then the next week she started bleeding again. Advised pt that this episode of bleeding is probably her menstrual cycle and the bleeding that she previously had was more than likely postpartum bleeding. Pt states that it is like her normal period. Advised pt that if it lasts longer than 7-10 days or if it became heavy so as much that she was soaking a pad an hour, to call us for an appt. Pt verbalized understanding.

## 2017-03-19 ENCOUNTER — Encounter: Payer: Self-pay | Admitting: Advanced Practice Midwife

## 2017-03-19 ENCOUNTER — Ambulatory Visit (INDEPENDENT_AMBULATORY_CARE_PROVIDER_SITE_OTHER): Payer: 59 | Admitting: Advanced Practice Midwife

## 2017-03-19 MED ORDER — MEDROXYPROGESTERONE ACETATE 150 MG/ML IM SUSP: 150.0000 mg | INTRAMUSCULAR | 3 refills | Status: DC

## 2017-03-19 NOTE — Progress Notes (Signed)
Jacqueline PeonKrista Barrera is a 21 y.o. who presents for a postpartum visit. Jacqueline Barrera is 4 weeks postpartum following a low forceps vaginal delivery. I have fully reviewed the prenatal and intrapartum course. The delivery was at 32.3 gestational weeks.  Jacqueline Barrera presented w/vaginal bleeding/severe Prex/abruption/IUFD. Anesthesia: epidural. Postpartum course has been uneventfull  Jacqueline Barrera hasn't taken Norvasc in several weeks.  Bleeding: had a period last week. Bowel function is normal. Bladder function is normal. Patient is sexually active. Contraception method is none. Postpartum depression screening: negative. Has lots sof family support; offered counseling, declined.  Recommende support group, declined. Feels like Jacqueline Barrera is coping well.   Current Outpatient Prescriptions:  .  ibuprofen (ADVIL,MOTRIN) 600 MG tablet, Take 1 tablet (600 mg total) by mouth every 6 (six) hours., Disp: 30 tablet, Rfl: 0 .  amLODipine (NORVASC) 10 MG tablet, Take 1 tablet (10 mg total) by mouth daily. (Patient not taking: Reported on 03/19/2017), Disp: 30 tablet, Rfl: 1 .  flintstones complete (FLINTSTONES) 60 MG chewable tablet, Chew 1 tablet by mouth daily., Disp: , Rfl:   Review of Systems   Constitutional: Negative for fever and chills Eyes: Negative for visual disturbances Respiratory: Negative for shortness of breath, dyspnea Cardiovascular: Negative for chest pain or palpitations  Gastrointestinal: Negative for vomiting, diarrhea and constipation Genitourinary: Negative for dysuria and urgency Musculoskeletal: Negative for back pain, joint pain, myalgias  Neurological: Negative for dizziness and headaches    Objective:     Vitals:   03/19/17 0855  BP: 140/90  Pulse: 95   General:  alert, cooperative and no distress   Breasts:  negative  Lungs: clear to auscultation bilaterally  Heart:  regular rate and rhythm  Abdomen: Soft, nontender   Vulva:  normal  Vagina: normal vagina  Cervix:  closed  Corpus: Well involuted      Rectal Exam: no hemorrhoids        Assessment:    normal postpartum exam.  Plan:   1. Contraception: Depo-Provera injections 2. Follow up in:  When Jacqueline Barrera decides Jacqueline Barrera wants to start depo )sent to pharmacy). Aware that Jacqueline Barrera is fertile, should start PNV if not using BC.  Take BP about once a week. Let us know if >15-0/100.

## 2017-04-21 ENCOUNTER — Ambulatory Visit (INDEPENDENT_AMBULATORY_CARE_PROVIDER_SITE_OTHER): Payer: 59 | Admitting: Otolaryngology

## 2017-04-21 DIAGNOSIS — H6122 Impacted cerumen, left ear: Secondary | ICD-10-CM

## 2017-04-21 DIAGNOSIS — H6983 Other specified disorders of Eustachian tube, bilateral: Secondary | ICD-10-CM

## 2017-04-21 DIAGNOSIS — H7201 Central perforation of tympanic membrane, right ear: Secondary | ICD-10-CM

## 2017-04-28 ENCOUNTER — Ambulatory Visit: Payer: 59 | Admitting: Adult Health

## 2017-05-01 ENCOUNTER — Other Ambulatory Visit: Payer: Self-pay

## 2017-05-01 ENCOUNTER — Encounter: Payer: Self-pay | Admitting: Adult Health

## 2017-05-01 ENCOUNTER — Ambulatory Visit (INDEPENDENT_AMBULATORY_CARE_PROVIDER_SITE_OTHER): Payer: 59 | Admitting: Adult Health

## 2017-05-01 VITALS — BP 142/80 | HR 100 | Ht 64.0 in | Wt 174.0 lb

## 2017-05-01 DIAGNOSIS — N926 Irregular menstruation, unspecified: Secondary | ICD-10-CM | POA: Diagnosis not present

## 2017-05-01 DIAGNOSIS — Z3201 Encounter for pregnancy test, result positive: Secondary | ICD-10-CM | POA: Diagnosis not present

## 2017-05-01 DIAGNOSIS — Z3A01 Less than 8 weeks gestation of pregnancy: Secondary | ICD-10-CM | POA: Insufficient documentation

## 2017-05-01 DIAGNOSIS — Z113 Encounter for screening for infections with a predominantly sexual mode of transmission: Secondary | ICD-10-CM

## 2017-05-01 DIAGNOSIS — O09291 Supervision of pregnancy with other poor reproductive or obstetric history, first trimester: Secondary | ICD-10-CM

## 2017-05-01 DIAGNOSIS — Z8759 Personal history of other complications of pregnancy, childbirth and the puerperium: Secondary | ICD-10-CM | POA: Diagnosis not present

## 2017-05-01 DIAGNOSIS — O3680X0 Pregnancy with inconclusive fetal viability, not applicable or unspecified: Secondary | ICD-10-CM

## 2017-05-01 LAB — POCT URINE PREGNANCY: PREG TEST UR: POSITIVE — AB

## 2017-05-01 MED ORDER — FLINTSTONES COMPLETE 60 MG PO CHEW
CHEWABLE_TABLET | ORAL | Status: DC
Start: 2017-05-01 — End: 2017-07-15

## 2017-05-01 MED ORDER — ASPIRIN 81 MG PO CHEW: CHEWABLE_TABLET | ORAL | Status: DC

## 2017-05-01 NOTE — Progress Notes (Signed)
Subjective:     Patient ID: Jacqueline Barrera, female   DOB: August 24, 1995, 21 y.o.   MRN: [id number]  HPI Jacqueline Barrera is a 21 year old black female in for UPT, has missed a period.She has history of severe eclampsia with abruption and fetal loss at 32 weeks. She is no longer taking BP meds.She requests STD testing today.   Review of Systems +missed period RUQ pain at times  Reviewed past medical,surgical, social and family history. Reviewed medications and allergies.     Objective:   Physical Exam BP (!) 142/80 (BP Location: Left Arm, Patient Position: Sitting, Cuff Size: Normal)   Pulse 100   Ht 5\' 4"  (1.626 m)   Wt 174 lb (78.9 kg)   LMP 03/16/2017 (Exact Date)   BMI 29.87 kg/m UPT +, about 6+4 weeks by LMP with EDD 12/21/17.Skin warm and dry. Neck: mid line trachea, normal thyroid, good ROM, no lymphadenopathy noted. Lungs: clear to ausculation bilaterally. Cardiovascular: regular rate and rhythm. Pelvic: external genitalia is normal in appearance no lesions, vagina: white discharge without odor,urethra has no lesions or masses noted, cervix:smooth, uterus: normal size, shape and contour, non tender, no masses felt, adnexa: no masses or tenderness noted. Bladder is non tender and no masses felt.  GC/CHL obtained. Abdomen is soft and non tender.  Will start ASA 81 mg at 12 weeks.  Assessment:     1. Positive pregnancy test   2. Less than [redacted] weeks gestation of pregnancy   3. Encounter to determine fetal viability of pregnancy, single or unspecified fetus   4. Screening examination for STD (sexually transmitted disease)   5. History of pre-eclampsia in prior pregnancy, currently pregnant in first trimester   6. History of fetal loss       Plan:     Meds ordered this encounter  Medications  . flintstones complete (FLINTSTONES) 60 MG chewable tablet    Sig: Take 2 daily    Order Specific Question:   Supervising Provider    Answer:   Despina HiddenEURE, LUTHER H [[id number]]  . aspirin (ASPIRIN CHILDRENS) 81  MG chewable tablet    Sig: Take 1 daily starting at 12 weeks    Order Specific Question:   Supervising Provider    Answer:   Lazaro ArmsEURE, LUTHER H [[id number]]  GC/CHL sent   Return in 1 week for dating US Review handouts on first trimester and by [facility]

## 2017-05-01 NOTE — Patient Instructions (Signed)
First Trimester of Pregnancy The first trimester of pregnancy is from week 1 until the end of week 13 (months 1 through 3). A week after a sperm fertilizes an egg, the egg will implant on the wall of the uterus. This embryo will begin to develop into a baby. Genes from you and your partner will form the baby. The female genes will determine whether the baby will be a boy or a girl. At 6-8 weeks, the eyes and face will be formed, and the heartbeat can be seen on ultrasound. At the end of 12 weeks, all the baby's organs will be formed. Now that you are pregnant, you will want to do everything you can to have a healthy baby. Two of the most important things are to get good prenatal care and to follow your health care provider's instructions. Prenatal care is all the medical care you receive before the baby's birth. This care will help prevent, find, and treat any problems during the pregnancy and childbirth. Body changes during your first trimester Your body goes through many changes during pregnancy. The changes vary from woman to woman.  You may gain or lose a couple of pounds at first.  You may feel sick to your stomach (nauseous) and you may throw up (vomit). If the vomiting is uncontrollable, call your health care provider.  You may tire easily.  You may develop headaches that can be relieved by medicines. All medicines should be approved by your health care provider.  You may urinate more often. Painful urination may mean you have a bladder infection.  You may develop heartburn as a result of your pregnancy.  You may develop constipation because certain hormones are causing the muscles that push stool through your intestines to slow down.  You may develop hemorrhoids or swollen veins (varicose veins).  Your breasts may begin to grow larger and become tender. Your nipples may stick out more, and the tissue that surrounds them (areola) may become darker.  Your gums may bleed and may be  sensitive to brushing and flossing.  Dark spots or blotches (chloasma, mask of pregnancy) may develop on your face. This will likely fade after the baby is born.  Your menstrual periods will stop.  You may have a loss of appetite.  You may develop cravings for certain kinds of food.  You may have changes in your emotions from day to day, such as being excited to be pregnant or being concerned that something may go wrong with the pregnancy and baby.  You may have more vivid and strange dreams.  You may have changes in your hair. These can include thickening of your hair, rapid growth, and changes in texture. Some women also have hair loss during or after pregnancy, or hair that feels dry or thin. Your hair will most likely return to normal after your baby is born.  What to expect at prenatal visits During a routine prenatal visit:  You will be weighed to make sure you and the baby are growing normally.  Your blood pressure will be taken.  Your abdomen will be measured to track your baby's growth.  The fetal heartbeat will be listened to between weeks 10 and 14 of your pregnancy.  Test results from any previous visits will be discussed.  Your health care provider may ask you:  How you are feeling.  If you are feeling the baby move.  If you have had any abnormal symptoms, such as leaking fluid, bleeding, severe headaches,   or abdominal cramping.  If you are using any tobacco products, including cigarettes, chewing tobacco, and electronic cigarettes.  If you have any questions.  Other tests that may be performed during your first trimester include:  Blood tests to find your blood type and to check for the presence of any previous infections. The tests will also be used to check for low iron levels (anemia) and protein on red blood cells (Rh antibodies). Depending on your risk factors, or if you previously had diabetes during pregnancy, you may have tests to check for high blood  sugar that affects pregnant women (gestational diabetes).  Urine tests to check for infections, diabetes, or protein in the urine.  An ultrasound to confirm the proper growth and development of the baby.  Fetal screens for spinal cord problems (spina bifida) and Down syndrome.  HIV (human immunodeficiency virus) testing. Routine prenatal testing includes screening for HIV, unless you choose not to have this test.  You may need other tests to make sure you and the baby are doing well.  Follow these instructions at home: Medicines  Follow your health care provider's instructions regarding medicine use. Specific medicines may be either safe or unsafe to take during pregnancy.  Take a prenatal vitamin that contains at least 600 micrograms (mcg) of folic acid.  If you develop constipation, try taking a stool softener if your health care provider approves. Eating and drinking  Eat a balanced diet that includes fresh fruits and vegetables, whole grains, good sources of protein such as meat, eggs, or tofu, and low-fat dairy. Your health care provider will help you determine the amount of weight gain that is right for you.  Avoid raw meat and uncooked cheese. These carry germs that can cause birth defects in the baby.  Eating four or five small meals rather than three large meals a day may help relieve nausea and vomiting. If you start to feel nauseous, eating a few soda crackers can be helpful. Drinking liquids between meals, instead of during meals, also seems to help ease nausea and vomiting.  Limit foods that are high in fat and processed sugars, such as fried and sweet foods.  To prevent constipation: ? Eat foods that are high in fiber, such as fresh fruits and vegetables, whole grains, and beans. ? Drink enough fluid to keep your urine clear or pale yellow. Activity  Exercise only as directed by your health care provider. Most women can continue their usual exercise routine during  pregnancy. Try to exercise for 30 minutes at least 5 days a week. Exercising will help you: ? Control your weight. ? Stay in shape. ? Be prepared for labor and delivery.  Experiencing pain or cramping in the lower abdomen or lower back is a good sign that you should stop exercising. Check with your health care provider before continuing with normal exercises.  Try to avoid standing for long periods of time. Move your legs often if you must stand in one place for a long time.  Avoid heavy lifting.  Wear low-heeled shoes and practice good posture.  You may continue to have sex unless your health care provider tells you not to. Relieving pain and discomfort  Wear a good support bra to relieve breast tenderness.  Take warm sitz baths to soothe any pain or discomfort caused by hemorrhoids. Use hemorrhoid cream if your health care provider approves.  Rest with your legs elevated if you have leg cramps or low back pain.  If you develop   varicose veins in your legs, wear support hose. Elevate your feet for 15 minutes, 3-4 times a day. Limit salt in your diet. Prenatal care  Schedule your prenatal visits by the twelfth week of pregnancy. They are usually scheduled monthly at first, then more often in the last 2 months before delivery.  Write down your questions. Take them to your prenatal visits.  Keep all your prenatal visits as told by your health care provider. This is important. Safety  Wear your seat belt at all times when driving.  Make a list of emergency phone numbers, including numbers for family, friends, the hospital, and police and fire departments. General instructions  Ask your health care provider for a referral to a local prenatal education class. Begin classes no later than the beginning of month 6 of your pregnancy.  Ask for help if you have counseling or nutritional needs during pregnancy. Your health care provider can offer advice or refer you to specialists for help  with various needs.  Do not use hot tubs, steam rooms, or saunas.  Do not douche or use tampons or scented sanitary pads.  Do not cross your legs for long periods of time.  Avoid cat litter boxes and soil used by cats. These carry germs that can cause birth defects in the baby and possibly loss of the fetus by miscarriage or stillbirth.  Avoid all smoking, herbs, alcohol, and medicines not prescribed by your health care provider. Chemicals in these products affect the formation and growth of the baby.  Do not use any products that contain nicotine or tobacco, such as cigarettes and e-cigarettes. If you need help quitting, ask your health care provider. You may receive counseling support and other resources to help you quit.  Schedule a dentist appointment. At home, brush your teeth with a soft toothbrush and be gentle when you floss. Contact a health care provider if:  You have dizziness.  You have mild pelvic cramps, pelvic pressure, or nagging pain in the abdominal area.  You have persistent nausea, vomiting, or diarrhea.  You have a bad smelling vaginal discharge.  You have pain when you urinate.  You notice increased swelling in your face, hands, legs, or ankles.  You are exposed to fifth disease or chickenpox.  You are exposed to German measles (rubella) and have never had it. Get help right away if:  You have a fever.  You are leaking fluid from your vagina.  You have spotting or bleeding from your vagina.  You have severe abdominal cramping or pain.  You have rapid weight gain or loss.  You vomit blood or material that looks like coffee grounds.  You develop a severe headache.  You have shortness of breath.  You have any kind of trauma, such as from a fall or a car accident. Summary  The first trimester of pregnancy is from week 1 until the end of week 13 (months 1 through 3).  Your body goes through many changes during pregnancy. The changes vary from  woman to woman.  You will have routine prenatal visits. During those visits, your health care provider will examine you, discuss any test results you may have, and talk with you about how you are feeling. This information is not intended to replace advice given to you by your health care provider. Make sure you discuss any questions you have with your health care provider. Document Released: 04/30/2001 Document Revised: 04/17/2016 Document Reviewed: 04/17/2016 Elsevier Interactive Patient Education  2017 [employer]   Inc[employer]  

## 2017-05-06 LAB — GC/CHLAMYDIA PROBE AMP
CHLAMYDIA, DNA PROBE: NEGATIVE
NEISSERIA GONORRHOEAE BY PCR: NEGATIVE

## 2017-05-07 ENCOUNTER — Ambulatory Visit (INDEPENDENT_AMBULATORY_CARE_PROVIDER_SITE_OTHER): Payer: 59

## 2017-05-07 DIAGNOSIS — O3680X0 Pregnancy with inconclusive fetal viability, not applicable or unspecified: Secondary | ICD-10-CM

## 2017-05-07 DIAGNOSIS — Z3A08 8 weeks gestation of pregnancy: Secondary | ICD-10-CM | POA: Diagnosis not present

## 2017-05-07 NOTE — Progress Notes (Signed)
US 8+2 wks,single IUP,positive FHT 166 bpm,normal ovaries bilat,crl 18.67 mm,EDD 12/15/2017 by [place]

## 2017-05-15 ENCOUNTER — Encounter: Payer: Self-pay | Admitting: Women's Health

## 2017-05-20 NOTE — L&D Delivery Note (Signed)
Delivery Note At 6:01 AM a viable female was delivered via Vaginal, Spontaneous (Presentation: vertex, OA  ).  APGAR: ,9/9 ; weight  .  pending Placenta status: intact.  Cord:  3 vessel with the following complications: none.  Cord pH: pending  Anesthesia:  epidural Episiotomy: None Lacerations: None Suture Repair: none Est. Blood Loss (mL): 200  Mom to postpartum.  Baby to Couplet care / Skin to Skin.  Jacqueline Barrera 11/20/2017, 6:25 AM

## 2017-05-21 ENCOUNTER — Telehealth: Payer: Self-pay | Admitting: Women's Health

## 2017-05-21 NOTE — Telephone Encounter (Signed)
Spoke to Broken BowJennifer and informed patient UTD states bites should resolve on their own and could be treated with an antihistamine like Benadryl or hydrocortisone cream.  Advised to call us back if does not get better after taking meds and encouraged to wash all linen.  Verbalized understanding.

## 2017-05-21 NOTE — Telephone Encounter (Signed)
Patient states she has been bitten by bed bugs yesterday on her hands, abdomen, chest, back and behind ear.  States her hand is swollen and all the areas itch.  Informed she could try Benadryl or hydrocortisone cream to areas.

## 2017-05-21 NOTE — Telephone Encounter (Signed)
Patient called stating that she is preg and was bitten by bed bugs, pt states that her hand is swollen. Please contact pt

## 2017-05-22 ENCOUNTER — Ambulatory Visit (INDEPENDENT_AMBULATORY_CARE_PROVIDER_SITE_OTHER): Payer: Medicaid Other | Admitting: Otolaryngology

## 2017-05-26 ENCOUNTER — Encounter: Payer: 59 | Admitting: Women's Health

## 2017-05-26 ENCOUNTER — Telehealth: Payer: Self-pay | Admitting: *Deleted

## 2017-05-26 ENCOUNTER — Ambulatory Visit: Payer: 59 | Admitting: *Deleted

## 2017-05-26 NOTE — Telephone Encounter (Signed)
Patient called stating she noticed light pink spotting earlier this morning.  She is not having any cramping or bleeding currently.  Advised to continue to monitor and to let us know if she develops heavier bleeding or cramping.  Also encouraged to push fluids. Verbalized understanding.

## 2017-06-04 ENCOUNTER — Emergency Department (HOSPITAL_COMMUNITY)
Admission: EM | Admit: 2017-06-04 | Discharge: 2017-06-04 | Disposition: A | Discharge status: Home / Self Care | Payer: 59 | Attending: Emergency Medicine | Admitting: Emergency Medicine

## 2017-06-04 ENCOUNTER — Encounter (HOSPITAL_COMMUNITY): Payer: Self-pay | Admitting: Emergency Medicine

## 2017-06-04 ENCOUNTER — Other Ambulatory Visit: Payer: Self-pay

## 2017-06-04 DIAGNOSIS — R1011 Right upper quadrant pain: Secondary | ICD-10-CM | POA: Diagnosis present

## 2017-06-04 DIAGNOSIS — A599 Trichomoniasis, unspecified: Secondary | ICD-10-CM

## 2017-06-04 DIAGNOSIS — Z87891 Personal history of nicotine dependence: Secondary | ICD-10-CM | POA: Insufficient documentation

## 2017-06-04 DIAGNOSIS — A5900 Urogenital trichomoniasis, unspecified: Secondary | ICD-10-CM | POA: Insufficient documentation

## 2017-06-04 LAB — COMPREHENSIVE METABOLIC PANEL
ALBUMIN: 3.8 g/dL (ref 3.5–5.0)
ALT: 13 U/L — ABNORMAL LOW (ref 14–54)
AST: 14 U/L — ABNORMAL LOW (ref 15–41)
Alkaline Phosphatase: 58 U/L (ref 38–126)
Anion gap: 10 (ref 5–15)
BUN: 9 mg/dL (ref 6–20)
CHLORIDE: 101 mmol/L (ref 101–111)
CO2: 22 mmol/L (ref 22–32)
Calcium: 9.1 mg/dL (ref 8.9–10.3)
Creatinine, Ser: 0.6 mg/dL (ref 0.44–1.00)
GFR calc Af Amer: >60 mL/min (ref >60)
GFR calc non Af Amer: >60 mL/min (ref >60)
GLUCOSE: 83 mg/dL (ref 65–99)
POTASSIUM: 3.5 mmol/L (ref 3.5–5.1)
SODIUM: 133 mmol/L — AB (ref 135–145)
Total Bilirubin: 0.2 mg/dL — ABNORMAL LOW (ref 0.3–1.2)
Total Protein: 7.6 g/dL (ref 6.5–8.1)

## 2017-06-04 LAB — CBC WITH DIFFERENTIAL/PLATELET
Basophils Absolute: 0 10*3/uL (ref 0.0–0.1)
Basophils Relative: 0 %
Eosinophils Absolute: 0.1 10*3/uL (ref 0.0–0.7)
Eosinophils Relative: 1 %
HEMATOCRIT: 34.1 % — AB (ref 36.0–46.0)
Hemoglobin: 11.2 g/dL — ABNORMAL LOW (ref 12.0–15.0)
LYMPHS PCT: 32 %
Lymphs Abs: 2.2 10*3/uL (ref 0.7–4.0)
MCH: 26.2 pg (ref 26.0–34.0)
MCHC: 32.8 g/dL (ref 30.0–36.0)
MCV: 79.9 fL (ref 78.0–100.0)
MONO ABS: 0.6 10*3/uL (ref 0.1–1.0)
MONOS PCT: 9 %
Neutro Abs: 4 10*3/uL (ref 1.7–7.7)
Neutrophils Relative %: 58 %
Platelets: 284 10*3/uL (ref 150–400)
RBC: 4.27 MIL/uL (ref 3.87–5.11)
RDW: 15.5 % (ref 11.5–15.5)
WBC: 6.8 10*3/uL (ref 4.0–10.5)

## 2017-06-04 LAB — WET PREP, GENITAL
SPERM: NONE SEEN
Yeast Wet Prep HPF POC: NONE SEEN

## 2017-06-04 LAB — LIPASE, BLOOD: Lipase: 22 U/L (ref 11–51)

## 2017-06-04 MED ORDER — METRONIDAZOLE 500 MG PO TABS
2000.0000 mg | ORAL_TABLET | Freq: Once | ORAL | Status: AC
  Administered 2017-06-04: 2000 mg via ORAL
  Filled 2017-06-04: qty 4

## 2017-06-04 NOTE — Discharge Instructions (Signed)
Please take Tylenol for pain Avoid fatty foods Follow up with your doctor Return if worsening

## 2017-06-04 NOTE — ED Triage Notes (Signed)
Pt c/o blood tinged vaginal discharge and right upper abd pain. Pt states she is [redacted] weeks pregnant.

## 2017-06-04 NOTE — ED Notes (Signed)
Sprite given 

## 2017-06-04 NOTE — ED Provider Notes (Signed)
[facility] EMERGENCY DEPARTMENT Provider Note   CSN: [id number] Arrival date & time: 06/04/17  2003     History   Chief Complaint Chief Complaint  Patient presents with  . Abdominal Pain    HPI Jacqueline Barrera is a 22 y.o. G41P0 female  who presents with right upper quadrant abdominal pain.  Past medical history significant for history of preeclampsia and miscarriage.  Patient is currently [redacted] weeks pregnant follows with OB/GYN.  She states that she has had intermittent right upper quadrant pain for the past month.  Nothing seems to make it better or worse. Today the pain was severe so she came to the emergency department.  She also is having headache and cold symptoms.  She is also concerned because she is been having some pink tinged vaginal discharge for the past couple weeks.  She told her OB/GYN about this and they gave her reassurance stating this was normal in the first trimester.  She denies any fever, cough, pelvic pain, diarrhea, urinary symptoms.   HPI  Past Medical History:  Diagnosis Date  . Anxiety   . Scoliosis     Patient Active Problem List   Diagnosis Date Noted  . Encounter to determine fetal viability of pregnancy 05/01/2017  . Positive pregnancy test 05/01/2017  . Less than [redacted] weeks gestation of pregnancy 05/01/2017  . History of fetal loss 05/01/2017  . History of pre-eclampsia in prior pregnancy, currently pregnant in first trimester 05/01/2017  . SVD (spontaneous vaginal delivery) 02/04/2017  . IUFD at 20 weeks or more of gestation 02/04/2017  . Placental abruption in third trimester 02/04/2017  . Pre-eclampsia 02/03/2017  . Purulent discharge from right nipple 01/01/2017  . Supervision of normal first pregnancy 08/28/2016  . Marijuana use 08/28/2016  . Chlamydia 02/22/2014    Past Surgical History:  Procedure Laterality Date  . ADENOIDECTOMY    . TONSILLECTOMY AND ADENOIDECTOMY    . WISDOM TOOTH EXTRACTION      OB History    Gravida Para Term  Preterm AB Living   2 1   1    0   SAB TAB Ectopic Multiple Live Births         0         Home Medications    Prior to Admission medications   Medication Sig Start Date End Date Taking? Authorizing Provider  aspirin (ASPIRIN CHILDRENS) 81 MG chewable tablet Take 1 daily starting at 12 weeks 05/01/17   Adline Potter, NP  flintstones complete (FLINTSTONES) 60 MG chewable tablet Take 2 daily 05/01/17   Adline Potter, NP    Family History Family History  Problem Relation Age of Onset  . Hyperlipidemia Maternal Aunt   . Hypertension Maternal Grandmother     Social History Social History   Tobacco Use  . Smoking status: Former Smoker    Packs/day: 0.10    Years: 3.00    Pack years: 0.30    Types: Cigars    Last attempt to quit: 02/03/2014    Years since quitting: 3.3  . Smokeless tobacco: Never Used  Substance Use Topics  . Alcohol use: No    Alcohol/week: 21.0 oz    Types: 35 Shots of liquor per week    Comment: before pregnancy  . Drug use: Yes    Frequency: 7.0 times per week    Types: Marijuana    Comment: 04/30/17     Allergies   Other   Review of Systems Review of Systems  Constitutional: Negative for fever.  HENT: Positive for congestion.   Respiratory: Negative for cough and shortness of breath.   Cardiovascular: Negative for chest pain.  Gastrointestinal: Positive for abdominal pain, nausea and vomiting. Negative for diarrhea.  Genitourinary: Positive for vaginal discharge. Negative for dysuria, flank pain, frequency, pelvic pain and vaginal bleeding.  Neurological: Positive for headaches.  All other systems reviewed and are negative.    Physical Exam Updated Vital Signs BP (!) 151/93   Pulse (!) 101   Temp 99.1 F (37.3 C)   Resp 18   Ht 5\' 4"  (1.626 m)   Wt 79.4 kg (175 lb)   LMP 03/16/2017 (Exact Date)   SpO2 100%   BMI 30.04 kg/m   Physical Exam  Constitutional: She is oriented to person, place, and time. She appears  well-developed and well-nourished. No distress.  HENT:  Head: Normocephalic and atraumatic.  Bilateral cerumen impaction  Eyes: Conjunctivae are normal. Pupils are equal, round, and reactive to light. Right eye exhibits no discharge. Left eye exhibits no discharge. No scleral icterus.  Neck: Normal range of motion.  Cardiovascular: Normal rate and regular rhythm. Exam reveals no gallop and no friction rub.  No murmur heard. Pulmonary/Chest: Effort normal and breath sounds normal. No stridor. No respiratory distress. She has no wheezes. She has no rales. She exhibits no tenderness.  Abdominal: Soft. Bowel sounds are normal. She exhibits no distension and no mass. There is no tenderness. There is no rebound and no guarding.  Genitourinary:  Genitourinary Comments: Pelvic: No inguinal lymphadenopathy or inguinal hernia noted. Normal external genitalia. No pain with speculum insertion. Closed cervical os. Mild cervical irritation with small amount of bleeding. White, frothy discharge in vaginal vault. Chaperone Ship broker(Beth, RN) present during exam.    Neurological: She is alert and oriented to person, place, and time.  Skin: Skin is warm and dry.  Psychiatric: She has a normal mood and affect. Her behavior is normal.  Nursing note and vitals reviewed.    ED Treatments / Results  Labs (all labs ordered are listed, but only abnormal results are displayed) Labs Reviewed  WET PREP, GENITAL - Abnormal; Notable for the following components:      Result Value   Trich, Wet Prep PRESENT (*)    Clue Cells Wet Prep HPF POC PRESENT (*)    WBC, Wet Prep HPF POC MANY (*)    All other components within normal limits  COMPREHENSIVE METABOLIC PANEL - Abnormal; Notable for the following components:   Sodium 133 (*)    AST 14 (*)    ALT 13 (*)    Total Bilirubin 0.2 (*)    All other components within normal limits  CBC WITH DIFFERENTIAL/PLATELET - Abnormal; Notable for the following components:   Hemoglobin  11.2 (*)    HCT 34.1 (*)    All other components within normal limits  LIPASE, BLOOD  GC/CHLAMYDIA PROBE AMP ([place]) NOT AT [facility]    EKG  EKG Interpretation None       Radiology No results found.  Procedures Procedures (including critical care time)  Medications Ordered in ED Medications  metroNIDAZOLE (FLAGYL) tablet 2,000 mg (2,000 mg Oral Given 06/04/17 2300)    Initial Impression / Assessment and Plan / ED Course  I have reviewed the triage vital signs and the nursing notes.  Pertinent labs & imaging results that were available during my care of the patient were reviewed by me and considered in my medical decision making (see  chart for details).  22 year old with right upper quadrant abdominal pain and vaginal discharge.  Vital signs are normal.  Abdominal exam is benign.  Fetal heart tones are 148.  On pelvic exam the cervical eyes is closed.  She does have some irritation of the cervix which is likely the cause of her pink tinged discharge.  CBC is remarkable for mild anemia.  CMP is remarkable for mild hyponatremia.  LFTs are normal therefore doubt gallbladder pathology.  Wet prep is remarkable for trichomoniasis.  She was given 2 g of Flagyl. She was advised to avoid fatty meals and to take Tylenol as needed for pain.  Unfortunately the patient was discharged before empiric treatment for Gonorrhea and Chlamydia. Will await culture results and she will receive treatment if needed.   Final Clinical Impressions(s) / ED Diagnoses   Final diagnoses:  Trichomoniasis  Right upper quadrant abdominal pain    ED Discharge Orders    None       Bethel Born, PA-C 06/05/17 Dora Sims, MD 06/05/17 [phone]

## 2017-06-04 NOTE — ED Notes (Signed)
FHR 148

## 2017-06-06 LAB — GC/CHLAMYDIA PROBE AMP (~~LOC~~) NOT AT ARMC
Chlamydia: NEGATIVE
NEISSERIA GONORRHEA: NEGATIVE

## 2017-06-10 ENCOUNTER — Ambulatory Visit (INDEPENDENT_AMBULATORY_CARE_PROVIDER_SITE_OTHER): Payer: 59 | Admitting: Women's Health

## 2017-06-10 ENCOUNTER — Ambulatory Visit (INDEPENDENT_AMBULATORY_CARE_PROVIDER_SITE_OTHER): Payer: 59

## 2017-06-10 ENCOUNTER — Ambulatory Visit: Payer: 59 | Admitting: *Deleted

## 2017-06-10 ENCOUNTER — Encounter: Payer: Self-pay | Admitting: Women's Health

## 2017-06-10 VITALS — BP 126/68 | HR 80 | Wt 165.0 lb

## 2017-06-10 DIAGNOSIS — O0990 Supervision of high risk pregnancy, unspecified, unspecified trimester: Secondary | ICD-10-CM

## 2017-06-10 DIAGNOSIS — F419 Anxiety disorder, unspecified: Secondary | ICD-10-CM

## 2017-06-10 DIAGNOSIS — O0991 Supervision of high risk pregnancy, unspecified, first trimester: Secondary | ICD-10-CM

## 2017-06-10 DIAGNOSIS — Z3481 Encounter for supervision of other normal pregnancy, first trimester: Secondary | ICD-10-CM

## 2017-06-10 DIAGNOSIS — Z3A13 13 weeks gestation of pregnancy: Secondary | ICD-10-CM

## 2017-06-10 DIAGNOSIS — F1290 Cannabis use, unspecified, uncomplicated: Secondary | ICD-10-CM

## 2017-06-10 DIAGNOSIS — Z331 Pregnant state, incidental: Secondary | ICD-10-CM

## 2017-06-10 DIAGNOSIS — O09291 Supervision of pregnancy with other poor reproductive or obstetric history, first trimester: Secondary | ICD-10-CM

## 2017-06-10 DIAGNOSIS — Z1389 Encounter for screening for other disorder: Secondary | ICD-10-CM

## 2017-06-10 DIAGNOSIS — Z3682 Encounter for antenatal screening for nuchal translucency: Secondary | ICD-10-CM

## 2017-06-10 DIAGNOSIS — Z8759 Personal history of other complications of pregnancy, childbirth and the puerperium: Secondary | ICD-10-CM | POA: Insufficient documentation

## 2017-06-10 DIAGNOSIS — O99321 Drug use complicating pregnancy, first trimester: Secondary | ICD-10-CM | POA: Diagnosis not present

## 2017-06-10 DIAGNOSIS — O09299 Supervision of pregnancy with other poor reproductive or obstetric history, unspecified trimester: Secondary | ICD-10-CM | POA: Insufficient documentation

## 2017-06-10 DIAGNOSIS — O99341 Other mental disorders complicating pregnancy, first trimester: Secondary | ICD-10-CM

## 2017-06-10 LAB — POCT URINALYSIS DIPSTICK
GLUCOSE UA: NEGATIVE
Ketones, UA: NEGATIVE
Nitrite, UA: NEGATIVE
RBC UA: NEGATIVE

## 2017-06-10 NOTE — Patient Instructions (Addendum)
Jacqueline Barrera, I greatly value your feedback.  If you receive a survey following your visit with [place] today, we appreciate you taking the time to fill it out.  Thanks, Joellyn Haff, CNM, WHNP-BC   Nausea & Vomiting  Have saltine crackers or pretzels by your bed and eat a few bites before you raise your head out of bed in the morning  Eat small frequent meals throughout the day instead of large meals  Drink plenty of fluids throughout the day to stay hydrated, just don't drink a lot of fluids with your meals.  This can make your stomach fill up faster making you feel sick  Do not brush your teeth right after you eat  Products with real ginger are good for nausea, like ginger ale and ginger hard candy Make sure it says made with real ginger!  Sucking on sour candy like lemon heads is also good for nausea  If your prenatal vitamins make you nauseated, take them at night so you will sleep through the nausea  Sea Bands  If you feel like you need medicine for the nausea & vomiting please let us know  If you are unable to keep any fluids or food down please let us know   Constipation  Drink plenty of fluid, preferably water, throughout the day  Eat foods high in fiber such as fruits, vegetables, and grains  Exercise, such as walking, is a good way to keep your bowels regular  Drink warm fluids, especially warm prune juice, or decaf coffee  Eat a 1/2 cup of real oatmeal (not instant), 1/2 cup applesauce, and 1/2-1 cup warm prune juice every day  If needed, you may take Colace (docusate sodium) stool softener once or twice a day to help keep the stool soft. If you are pregnant, wait until you are out of your first trimester (12-14 weeks of pregnancy)  If you still are having problems with constipation, you may take Miralax once daily as needed to help keep your bowels regular.  If you are pregnant, wait until you are out of your first trimester (12-14 weeks of pregnancy)   First  Trimester of Pregnancy The first trimester of pregnancy is from week 1 until the end of week 12 (months 1 through 3). A week after a sperm fertilizes an egg, the egg will implant on the wall of the uterus. This embryo will begin to develop into a baby. Genes from you and your partner are forming the baby. The female genes determine whether the baby is a boy or a girl. At 6-8 weeks, the eyes and face are formed, and the heartbeat can be seen on ultrasound. At the end of 12 weeks, all the baby's organs are formed.  Now that you are pregnant, you will want to do everything you can to have a healthy baby. Two of the most important things are to get good prenatal care and to follow your health care provider's instructions. Prenatal care is all the medical care you receive before the baby's birth. This care will help prevent, find, and treat any problems during the pregnancy and childbirth. BODY CHANGES Your body goes through many changes during pregnancy. The changes vary from woman to woman.   You may gain or lose a couple of pounds at first.  You may feel sick to your stomach (nauseous) and throw up (vomit). If the vomiting is uncontrollable, call your health care provider.  You may tire easily.  You may develop headaches that  can be relieved by medicines approved by your health care provider.  You may urinate more often. Painful urination may mean you have a bladder infection.  You may develop heartburn as a result of your pregnancy.  You may develop constipation because certain hormones are causing the muscles that push waste through your intestines to slow down.  You may develop hemorrhoids or swollen, bulging veins (varicose veins).  Your breasts may begin to grow larger and become tender. Your nipples may stick out more, and the tissue that surrounds them (areola) may become darker.  Your gums may bleed and may be sensitive to brushing and flossing.  Dark spots or blotches (chloasma, mask  of pregnancy) may develop on your face. This will likely fade after the baby is born.  Your menstrual periods will stop.  You may have a loss of appetite.  You may develop cravings for certain kinds of food.  You may have changes in your emotions from day to day, such as being excited to be pregnant or being concerned that something may go wrong with the pregnancy and baby.  You may have more vivid and strange dreams.  You may have changes in your hair. These can include thickening of your hair, rapid growth, and changes in texture. Some women also have hair loss during or after pregnancy, or hair that feels dry or thin. Your hair will most likely return to normal after your baby is born. WHAT TO EXPECT AT YOUR PRENATAL VISITS During a routine prenatal visit:  You will be weighed to make sure you and the baby are growing normally.  Your blood pressure will be taken.  Your abdomen will be measured to track your baby's growth.  The fetal heartbeat will be listened to starting around week 10 or 12 of your pregnancy.  Test results from any previous visits will be discussed. Your health care provider may ask you:  How you are feeling.  If you are feeling the baby move.  If you have had any abnormal symptoms, such as leaking fluid, bleeding, severe headaches, or abdominal cramping.  If you have any questions. Other tests that may be performed during your first trimester include:  Blood tests to find your blood type and to check for the presence of any previous infections. They will also be used to check for low iron levels (anemia) and Rh antibodies. Later in the pregnancy, blood tests for diabetes will be done along with other tests if problems develop.  Urine tests to check for infections, diabetes, or protein in the urine.  An ultrasound to confirm the proper growth and development of the baby.  An amniocentesis to check for possible genetic problems.  Fetal screens for spina  bifida and Down syndrome.  You may need other tests to make sure you and the baby are doing well. HOME CARE INSTRUCTIONS  Medicines  Follow your health care provider's instructions regarding medicine use. Specific medicines may be either safe or unsafe to take during pregnancy.  Take your prenatal vitamins as directed.  If you develop constipation, try taking a stool softener if your health care provider approves. Diet  Eat regular, well-balanced meals. Choose a variety of foods, such as meat or vegetable-based protein, fish, milk and low-fat dairy products, vegetables, fruits, and whole grain breads and cereals. Your health care provider will help you determine the amount of weight gain that is right for you.  Avoid raw meat and uncooked cheese. These carry germs that can cause  birth defects in the baby.  Eating four or five small meals rather than three large meals a day may help relieve nausea and vomiting. If you start to feel nauseous, eating a few soda crackers can be helpful. Drinking liquids between meals instead of during meals also seems to help nausea and vomiting.  If you develop constipation, eat more high-fiber foods, such as fresh vegetables or fruit and whole grains. Drink enough fluids to keep your urine clear or pale yellow. Activity and Exercise  Exercise only as directed by your health care provider. Exercising will help you:  Control your weight.  Stay in shape.  Be prepared for labor and delivery.  Experiencing pain or cramping in the lower abdomen or low back is a good sign that you should stop exercising. Check with your health care provider before continuing normal exercises.  Try to avoid standing for long periods of time. Move your legs often if you must stand in one place for a long time.  Avoid heavy lifting.  Wear low-heeled shoes, and practice good posture.  You may continue to have sex unless your health care provider directs you  otherwise. Relief of Pain or Discomfort  Wear a good support bra for breast tenderness.   Take warm sitz baths to soothe any pain or discomfort caused by hemorrhoids. Use hemorrhoid cream if your health care provider approves.   Rest with your legs elevated if you have leg cramps or low back pain.  If you develop varicose veins in your legs, wear support hose. Elevate your feet for 15 minutes, 3-4 times a day. Limit salt in your diet. Prenatal Care  Schedule your prenatal visits by the twelfth week of pregnancy. They are usually scheduled monthly at first, then more often in the last 2 months before delivery.  Write down your questions. Take them to your prenatal visits.  Keep all your prenatal visits as directed by your health care provider. Safety  Wear your seat belt at all times when driving.  Make a list of emergency phone numbers, including numbers for family, friends, the hospital, and police and fire departments. General Tips  Ask your health care provider for a referral to a local prenatal education class. Begin classes no later than at the beginning of month 6 of your pregnancy.  Ask for help if you have counseling or nutritional needs during pregnancy. Your health care provider can offer advice or refer you to specialists for help with various needs.  Do not use hot tubs, steam rooms, or saunas.  Do not douche or use tampons or scented sanitary pads.  Do not cross your legs for long periods of time.  Avoid cat litter boxes and soil used by cats. These carry germs that can cause birth defects in the baby and possibly loss of the fetus by miscarriage or stillbirth.  Avoid all smoking, herbs, alcohol, and medicines not prescribed by your health care provider. Chemicals in these affect the formation and growth of the baby.  Schedule a dentist appointment. At home, brush your teeth with a soft toothbrush and be gentle when you floss. SEEK MEDICAL CARE IF:   You have  dizziness.  You have mild pelvic cramps, pelvic pressure, or nagging pain in the abdominal area.  You have persistent nausea, vomiting, or diarrhea.  You have a bad smelling vaginal discharge.  You have pain with urination.  You notice increased swelling in your face, hands, legs, or ankles. SEEK IMMEDIATE MEDICAL CARE IF:  You have a fever.  You are leaking fluid from your vagina.  You have spotting or bleeding from your vagina.  You have severe abdominal cramping or pain.  You have rapid weight gain or loss.  You vomit blood or material that looks like coffee grounds.  You are exposed to [place] measles and have never had them.  You are exposed to fifth disease or chickenpox.  You develop a severe headache.  You have shortness of breath.  You have any kind of trauma, such as from a fall or a car accident. Document Released: 04/30/2001 Document Revised: 09/20/2013 Document Reviewed: 03/16/2013 [facility] Patient Information 2015 [place]. This information is not intended to replace advice given to you by your health care provider. Make sure you discuss any questions you have with your health care provider.   PROTECT YOURSELF & YOUR BABY FROM THE FLU! Because you are pregnant, we at [facility], along with the Centers for Disease Control (CDC), recommend that you receive the flu vaccine to protect yourself and your baby from the flu. The flu is more likely to cause severe illness in pregnant women than in women of reproductive age who are not pregnant. Changes in the immune system, heart, and lungs during pregnancy make pregnant women (and women up to two weeks postpartum) more prone to severe illness from flu, including illness resulting in hospitalization. Flu also may be harmful for a pregnant woman's developing baby. A common flu symptom is fever, which may be associated with neural tube defects and other adverse outcomes for a developing baby. Getting vaccinated can  also help protect a baby after birth from flu. (Mom passes antibodies onto the developing baby during her pregnancy.)  A Flu Vaccine is the Best Protection Against Flu Getting a flu vaccine is the first and most important step in protecting against flu. Pregnant women should get a flu shot and not the live attenuated influenza vaccine (LAIV), also known as nasal spray flu vaccine. Flu vaccines given during pregnancy help protect both the mother and her baby from flu. Vaccination has been shown to reduce the risk of flu-associated acute respiratory infection in pregnant women by up to one-half. A 2018 study showed that getting a flu shot reduced a pregnant woman's risk of being hospitalized with flu by an average of 40 percent. Pregnant women who get a flu vaccine are also helping to protect their babies from flu illness for the first several months after their birth, when they are too young to get vaccinated.   A Long Record of Safety for Flu Shots in Pregnant Women Flu shots have been given to millions of pregnant women over many years with a good safety record. There is a lot of evidence that flu vaccines can be given safely during pregnancy; though these data are limited for the first trimester. The CDC recommends that pregnant women get vaccinated during any trimester of their pregnancy. It is very important for pregnant women to get the flu shot.   Other Preventive Actions In addition to getting a flu shot, pregnant women should take the same everyday preventive actions the CDC recommends of everyone, including covering coughs, washing hands often, and avoiding people who are sick.  Symptoms and Treatment If you get sick with flu symptoms call your doctor right away. There are antiviral drugs that can treat flu illness and prevent serious flu complications. The CDC recommends prompt treatment for people who have influenza infection or suspected influenza infection and who are  at high risk of serious  flu complications, such as people with asthma, diabetes (including gestational diabetes), or heart disease. Early treatment of influenza in hospitalized pregnant women has been shown to reduce the length of the hospital stay.  Symptoms Flu symptoms include fever, cough, sore throat, runny or stuffy nose, body aches, headache, chills and fatigue. Some people may also have vomiting and diarrhea. People may be infected with the flu and have respiratory symptoms without a fever.  Early Treatment is Important for Pregnant Women Treatment should begin as soon as possible because antiviral drugs work best when started early (within 48 hours after symptoms start). Antiviral drugs can make your flu illness milder and make you feel better faster. They may also prevent serious health problems that can result from flu illness. Oral oseltamivir (Tamiflu) is the preferred treatment for pregnant women because it has the most studies available to suggest that it is safe and beneficial. Antiviral drugs require a prescription from your provider. Having a fever caused by flu infection or other infections early in pregnancy may be linked to birth defects in a baby. In addition to taking antiviral drugs, pregnant women who get a fever should treat their fever with Tylenol (acetaminophen) and contact their provider immediately.  When to Seek Emergency Medical Care If you are pregnant and have any of these signs, seek care immediately:  Difficulty breathing or shortness of breath  Pain or pressure in the chest or abdomen  Sudden dizziness  Confusion  Severe or persistent vomiting  High fever that is not responding to Tylenol (or store brand equivalent)  Decreased or no movement of your baby  [employer].com.pthttps://www.cdc.gov/flu/protect/vaccine/pregnant.htm

## 2017-06-10 NOTE — Addendum Note (Signed)
Addended by: Tish FredericksonLANCASTER, Latonya Knight A on: 06/10/2017 01:37 PM   Modules accepted: Orders

## 2017-06-10 NOTE — Progress Notes (Signed)
US 13+1 wks,measurements c/w dates,normal ovaries bilat,crl 70.41 mm,FHR 146 bpm, NT 1.6 mm,NB present

## 2017-06-10 NOTE — Progress Notes (Signed)
INITIAL OBSTETRICAL VISIT Patient name: Jacqueline Barrera Mitcham MRN [id number]  Date of birth: August 11, 1995 Chief Complaint:   Initial Prenatal Visit (nausea, bilateral upper quadrant pain)  History of Present Illness:   Jacqueline Barrera Fatula is a 22 y.o. 24P0100 African American female at 2424w1d by 8wk u/s, with an Estimated Date of Delivery: 12/15/17 being seen today for her initial obstetrical visit.   Her obstetrical history is significant for severe pre-e w/ abruption and IUFD @ 32wks.   Today she reports nausea, mild vomiting- declines meds. Occ bilateral upper quadrant pain, better after eating, doesn't feel like she has reflux, hasn't tried any meds. Anxiety, denies SI/HI, no meds. Declines counseling/meds at this time.  Patient's last menstrual period was 03/16/2017 (exact date). Last pap 04/18/16. Results were: normal Review of Systems:   Pertinent items are noted in HPI Denies cramping/contractions, leakage of fluid, vaginal bleeding, abnormal vaginal discharge w/ itching/odor/irritation, headaches, visual changes, shortness of breath, chest pain, abdominal pain, severe nausea/vomiting, or problems with urination or bowel movements unless otherwise stated above.  Pertinent History Reviewed:  Reviewed past medical,surgical, social, obstetrical and family history.  Reviewed problem list, medications and allergies. OB History  Gravida Para Term Preterm AB Living  2 1   1    0  SAB TAB Ectopic Multiple Live Births        0      # Outcome Date GA Lbr Len/2nd Weight Sex Delivery Anes PTL Lv  2 Current           1 Preterm 02/04/17 3931w3d 08:21 / 00:23 3 lb 2.6 oz (1.435 kg) F Vag-Spont EPI N FD     Complications: Severe pre-eclampsia,Placental abruption     Physical Assessment:   Vitals:   06/10/17 0855  BP: 126/68  Pulse: 80  Weight: 165 lb (74.8 kg)  Body mass index is 28.32 kg/m.       Physical Examination:  General appearance - well appearing, and in no distress  Mental status - alert,  oriented to person, place, and time  Psych:  She has a normal mood and affect  Skin - warm and dry, normal color, no suspicious lesions noted  Chest - effort normal, all lung fields clear to auscultation bilaterally  Heart - normal rate and regular rhythm  Abdomen - soft, nontender  Extremities:  No swelling or varicosities noted  Pelvic - VULVA: normal appearing vulva with no masses, tenderness or lesions  VAGINA: normal appearing vagina with normal color and discharge, no lesions  CERVIX: normal appearing cervix without discharge or lesions, no CMT  Thin prep pap is not done  Fetal Heart Rate (bpm): +u/s via informal transabdominal u/s  Results for orders placed or performed in visit on 06/10/17 (from the past 24 hour(s))  POCT urinalysis dipstick   Collection Time: 06/10/17  9:08 AM  Result Value Ref Range   Color, UA     Clarity, UA     Glucose, UA neg    Bilirubin, UA     Ketones, UA neg    Spec Grav, UA  1.010 - 1.025   Blood, UA neg    pH, UA  5.0 - 8.0   Protein, UA trace    Urobilinogen, UA  0.2 or 1.0 E.U./dL   Nitrite, UA neg    Leukocytes, UA Small (1+) (A) Negative   Appearance     Odor      Assessment & Plan:  1) High-Risk Pregnancy G2P0100 at 724w1d with an Estimated Date  of Delivery: 12/15/17   2) Initial OB visit  3) H/O severe pre-e w/ abruption and IUFD @ 32wks> per LHE, 2 baby ASA daily (162mg ). Will get baseline pre-e labs  4) H/O IUFD> plan antenatal testing  5) Anxiety> no meds, declines need for counseling/meds at this time  Meds: No orders of the defined types were placed in this encounter.  Initial labs obtained Continue prenatal vitamins Reviewed n/v relief measures and warning s/s to report Reviewed recommended weight gain based on pre-gravid BMI Encouraged well-balanced diet Genetic Screening discussed Integrated Screen: requested Cystic fibrosis screening discussed declined Ultrasound discussed; fetal survey: requested CCNC completed>  not sent, pt declines this service Declined flu shot. She was advised that the flu shot is recommended during pregnancy to help protect her and her baby. We discussed that it is an inactivated vaccine-so side effects are minimal, it is considered safe to receive during any trimester, and pregnant women are at a higher risk of developing potential complications from the flu, including death. She was given printed information from the CDC regarding the flu shot and the flu.    Follow-up: Return for this week for 1st IT/NT (no visit), then 3wks for HROB and 2nd IT.   Orders Placed This Encounter  Procedures  . Urine Culture  . US Fetal Nuchal Translucency Measurement  . Obstetric Panel, Including HIV  . Urinalysis, Routine w reflex microscopic  . Pain Management Screening Profile (10S)  . Comprehensive metabolic panel  . Protein / creatinine ratio, urine  . POCT urinalysis dipstick    Marge Duncans CNM, [facility] 06/10/2017 10:12 AM

## 2017-06-11 LAB — MICROSCOPIC EXAMINATION: CASTS: NONE SEEN /LPF

## 2017-06-11 LAB — OBSTETRIC PANEL, INCLUDING HIV
Antibody Screen: NEGATIVE
BASOS ABS: 0 10*3/uL (ref 0.0–0.2)
Basos: 0 %
EOS (ABSOLUTE): 0 10*3/uL (ref 0.0–0.4)
Eos: 1 %
HIV SCREEN 4TH GENERATION: NONREACTIVE
Hematocrit: 34.6 % (ref 34.0–46.6)
Hemoglobin: 11.2 g/dL (ref 11.1–15.9)
Hepatitis B Surface Ag: NEGATIVE
Immature Grans (Abs): 0 10*3/uL (ref 0.0–0.1)
Immature Granulocytes: 0 %
Lymphocytes Absolute: 1.8 10*3/uL (ref 0.7–3.1)
Lymphs: 30 %
MCH: 26.2 pg — ABNORMAL LOW (ref 26.6–33.0)
MCHC: 32.4 g/dL (ref 31.5–35.7)
MCV: 81 fL (ref 79–97)
MONOCYTES: 8 %
MONOS ABS: 0.5 10*3/uL (ref 0.1–0.9)
NEUTROS ABS: 3.6 10*3/uL (ref 1.4–7.0)
Neutrophils: 61 %
PLATELETS: 299 10*3/uL (ref 150–379)
RBC: 4.27 x10E6/uL (ref 3.77–5.28)
RDW: 17.3 % — AB (ref 12.3–15.4)
RPR Ser Ql: NONREACTIVE
RUBELLA: 2.83 {index} (ref >0.99)
Rh Factor: POSITIVE
WBC: 5.9 10*3/uL (ref 3.4–10.8)

## 2017-06-11 LAB — COMPREHENSIVE METABOLIC PANEL
ALBUMIN: 4.5 g/dL (ref 3.5–5.5)
ALK PHOS: 63 IU/L (ref 39–117)
ALT: 13 IU/L (ref 0–32)
AST: 8 IU/L (ref 0–40)
Albumin/Globulin Ratio: 1.7 (ref 1.2–2.2)
BUN / CREAT RATIO: 11 (ref 9–23)
BUN: 7 mg/dL (ref 6–20)
CHLORIDE: 98 mmol/L (ref 96–106)
CO2: 22 mmol/L (ref 20–29)
CREATININE: 0.65 mg/dL (ref 0.57–1.00)
Calcium: 9.9 mg/dL (ref 8.7–10.2)
GFR calc Af Amer: 145 mL/min/{1.73_m2} (ref >59)
GFR calc non Af Amer: 126 mL/min/{1.73_m2} (ref >59)
GLOBULIN, TOTAL: 2.7 g/dL (ref 1.5–4.5)
Glucose: 91 mg/dL (ref 65–99)
Potassium: 4.1 mmol/L (ref 3.5–5.2)
SODIUM: 135 mmol/L (ref 134–144)
TOTAL PROTEIN: 7.2 g/dL (ref 6.0–8.5)

## 2017-06-11 LAB — PMP SCREEN PROFILE (10S), URINE
Amphetamine Scrn, Ur: NEGATIVE ng/mL
BARBITURATE SCREEN URINE: NEGATIVE ng/mL
BENZODIAZEPINE SCREEN, URINE: NEGATIVE ng/mL
CANNABINOIDS UR QL SCN: POSITIVE ng/mL — AB
Cocaine (Metab) Scrn, Ur: NEGATIVE ng/mL
Creatinine(Crt), U: 228.3 mg/dL (ref 20.0–300.0)
Methadone Screen, Urine: NEGATIVE ng/mL
OPIATE SCREEN URINE: NEGATIVE ng/mL
OXYCODONE+OXYMORPHONE UR QL SCN: NEGATIVE ng/mL
Ph of Urine: 5.4 (ref 4.5–8.9)
Phencyclidine Qn, Ur: NEGATIVE ng/mL
Propoxyphene Scrn, Ur: NEGATIVE ng/mL

## 2017-06-11 LAB — URINALYSIS, ROUTINE W REFLEX MICROSCOPIC
BILIRUBIN UA: NEGATIVE
GLUCOSE, UA: NEGATIVE
Ketones, UA: NEGATIVE
Nitrite, UA: NEGATIVE
PROTEIN UA: NEGATIVE
RBC UA: NEGATIVE
SPEC GRAV UA: 1.024 (ref 1.005–1.030)
Urobilinogen, Ur: 0.2 mg/dL (ref 0.2–1.0)
pH, UA: 5.5 (ref 5.0–7.5)

## 2017-06-11 LAB — PROTEIN / CREATININE RATIO, URINE
CREATININE, UR: 151.4 mg/dL
Protein, Ur: 23.4 mg/dL
Protein/Creat Ratio: 155 mg/g creat (ref 0–200)

## 2017-06-12 LAB — INTEGRATED 1
Crown Rump Length: 70.4 mm
Gest. Age on Collection Date: 13 weeks
MATERNAL AGE AT EDD: 24.1 a
NUCHAL TRANSLUCENCY (NT): 1.6 mm
Number of Fetuses: 1
PAPP-A VALUE: 2252.9 ng/mL
Weight: 165 [lb_av]

## 2017-06-12 LAB — URINE CULTURE

## 2017-06-17 ENCOUNTER — Encounter: Payer: Self-pay | Admitting: Women's Health

## 2017-07-01 ENCOUNTER — Encounter: Payer: Self-pay | Admitting: Advanced Practice Midwife

## 2017-07-01 ENCOUNTER — Ambulatory Visit (INDEPENDENT_AMBULATORY_CARE_PROVIDER_SITE_OTHER): Payer: 59 | Admitting: Advanced Practice Midwife

## 2017-07-01 VITALS — BP 126/70 | HR 96 | Wt 179.0 lb

## 2017-07-01 DIAGNOSIS — Z363 Encounter for antenatal screening for malformations: Secondary | ICD-10-CM

## 2017-07-01 DIAGNOSIS — Z8759 Personal history of other complications of pregnancy, childbirth and the puerperium: Secondary | ICD-10-CM

## 2017-07-01 DIAGNOSIS — Z3A16 16 weeks gestation of pregnancy: Secondary | ICD-10-CM

## 2017-07-01 DIAGNOSIS — R51 Headache: Secondary | ICD-10-CM

## 2017-07-01 DIAGNOSIS — Z331 Pregnant state, incidental: Secondary | ICD-10-CM

## 2017-07-01 DIAGNOSIS — O09299 Supervision of pregnancy with other poor reproductive or obstetric history, unspecified trimester: Secondary | ICD-10-CM

## 2017-07-01 DIAGNOSIS — Z1389 Encounter for screening for other disorder: Secondary | ICD-10-CM

## 2017-07-01 DIAGNOSIS — O0991 Supervision of high risk pregnancy, unspecified, first trimester: Secondary | ICD-10-CM

## 2017-07-01 DIAGNOSIS — O9989 Other specified diseases and conditions complicating pregnancy, childbirth and the puerperium: Secondary | ICD-10-CM

## 2017-07-01 DIAGNOSIS — Z1379 Encounter for other screening for genetic and chromosomal anomalies: Secondary | ICD-10-CM

## 2017-07-01 LAB — POCT URINALYSIS DIPSTICK
Blood, UA: NEGATIVE
Glucose, UA: NEGATIVE
Ketones, UA: NEGATIVE
Leukocytes, UA: NEGATIVE
Nitrite, UA: NEGATIVE

## 2017-07-01 NOTE — Patient Instructions (Signed)
For Headaches:   Stay well hydrated, drink enough water so that your urine is clear, sometimes if you are dehydrated you can get headaches  Eat small frequent meals and snacks, sometimes if you are hungry you can get headaches  Sometimes you get headaches during pregnancy from the pregnancy hormones  You can try tylenol (1-2 regular strength 325mg or 1-2 extra strength 500mg) as directed on the box. The least amount of medication that works is best.   Cool compresses (cool wet washcloth or ice pack) to area of head that is hurting  You can also try drinking a caffeinated drink to see if this will help  If not helping, try below:  For Prevention of Headaches/Migraines:  CoQ10 100mg three times daily  Vitamin B2 400mg daily  Magnesium Oxide 400-600mg daily  If You Get a Bad Headache/Migraine:  Benadryl 25mg   Magnesium Oxide  1 large Gatorade  2 extra strength Tylenol (1,000mg total)  1 cup coffee or Coke  If this doesn't help please call us @ [phone]   

## 2017-07-01 NOTE — Progress Notes (Signed)
HIGH-RISK PREGNANCY VISIT Patient name: Lucille Witts MRN [id number]  Date of birth: April 27, 1996 Chief Complaint:   High Risk Gestation (2 nd IT)  History of Present Illness:   Tanga Gloor is a 22 y.o. G46P0100 female at [redacted]w[redacted]d with an Estimated Date of Delivery: 12/15/17 being seen today for ongoing management of a high-risk pregnancy complicated by hx severe PreE w/abruption/IUFD at 32 weeks.  Today she reports some headaches., frontal, a few times a week.  Responds to either no tx, eating, resting, an occ APAP. Has some discharge from right nipple  .  Marland Kitchen  Movement: Absent. denies leaking of fluid.  Review of Systems:   Pertinent items are noted in HPI Denies abnormal vaginal discharge w/ itching/odor/irritation, headaches, visual changes, shortness of breath, chest pain, abdominal pain, severe nausea/vomiting, or problems with urination or bowel movements unless otherwise stated above.    Pertinent History Reviewed:  Medical & Surgical Hx:   Past Medical History:  Diagnosis Date  . Anxiety   . Scoliosis    Past Surgical History:  Procedure Laterality Date  . ADENOIDECTOMY    . TONSILLECTOMY AND ADENOIDECTOMY    . WISDOM TOOTH EXTRACTION     Family History  Problem Relation Age of Onset  . Hyperlipidemia Maternal Aunt   . Hypertension Maternal Grandmother     Current Outpatient Medications:  .  aspirin (ASPIRIN CHILDRENS) 81 MG chewable tablet, Take 1 daily starting at 12 weeks (Patient taking differently: 162 mg. Take 1 daily starting at 12 weeks), Disp: , Rfl:  .  flintstones complete (FLINTSTONES) 60 MG chewable tablet, Take 2 daily, Disp: , Rfl:  Social History: Reviewed -  reports that she quit smoking about 3 years ago. Her smoking use included cigars. She has a 0.30 pack-year smoking history. she has never used smokeless tobacco.   Physical Assessment:   Vitals:   07/01/17 1208  BP: 126/70  Pulse: 96  Weight: 179 lb (81.2 kg)  Body mass index is 30.73 kg/m.         Physical Examination:   General appearance: alert, well appearing, and in no distress  Mental status: alert, oriented to person, place, and time  Skin: warm & dry   Extremities: Edema: None    Cardiovascular: normal heart rate noted  Respiratory: normal respiratory effort, no distress  Breast:  Right nipple has a few ducts that leak  No erythema/tenderness  Abdomen: gravid, soft, non-tender  Pelvic: Cervical exam deferred         Fetal Status: Fetal Heart Rate (bpm): 150    Movement: Absent    Fetal Surveillance Testing today: doppler  Results for orders placed or performed in visit on 07/01/17 (from the past 24 hour(s))  POCT urinalysis dipstick   Collection Time: 07/01/17 12:19 PM  Result Value Ref Range   Color, UA     Clarity, UA     Glucose, UA neg    Bilirubin, UA     Ketones, UA neg    Spec Grav, UA  1.010 - 1.025   Blood, UA neg    pH, UA  5.0 - 8.0   Protein, UA trace    Urobilinogen, UA  0.2 or 1.0 E.U./dL   Nitrite, UA neg    Leukocytes, UA Negative Negative   Appearance     Odor      Assessment & Plan:  1) High-risk pregnancy G2P0100 at [redacted]w[redacted]d with an Estimated Date of Delivery: 12/15/17   2) Hx severe PreE w/abruption/IUFD at  32 weeks,   3) leaky milk duct, can wear pads.  If gets red/tender, reevaluate  4)   HA, non-intractable, responds to conservative measures  Labs/procedures today: 2nd IT  Medications: ASA 162mg   Treatment Plan:   U/S @ 20, 24, 28, 32, 36wks    BPP weekly @ 28wks then 2x/wk testing @ 32wks or weekly BPP   Deliver @ 39wks:_  Follow-up: Return in about 2 weeks (around 07/15/2017) for HROB, ZO:[id number]:Anatomy.  Orders Placed This Encounter  Procedures  . US OB Comp + 14 Wk  . INTEGRATED 2  . POCT urinalysis dipstick   Jacklyn ShellFrances Cresenzo-Dishmon CNM 07/01/2017 12:49 PM

## 2017-07-04 LAB — INTEGRATED 2
AFP MARKER: 52 ng/mL
AFP MoM: 1.67
Crown Rump Length: 70.4 mm
DIA MoM: 1.54
DIA VALUE: 249 pg/mL
ESTRIOL UNCONJUGATED: 0.4 ng/mL
GEST. AGE ON COLLECTION DATE: 13 wk
Gestational Age: 16 weeks
HCG MOM: 0.87
HCG VALUE: 28.3 [IU]/mL
MATERNAL AGE AT EDD: 24.1 a
NUCHAL TRANSLUCENCY (NT): 1.6 mm
Nuchal Translucency MoM: 0.92
Number of Fetuses: 1
PAPP-A MOM: 2.13
PAPP-A Value: 2252.9 ng/mL
Test Results:: NEGATIVE
WEIGHT: 165 [lb_av]
Weight: 165 [lb_av]
uE3 MoM: 0.52

## 2017-07-15 ENCOUNTER — Ambulatory Visit (INDEPENDENT_AMBULATORY_CARE_PROVIDER_SITE_OTHER): Payer: 59 | Admitting: Obstetrics & Gynecology

## 2017-07-15 ENCOUNTER — Ambulatory Visit (INDEPENDENT_AMBULATORY_CARE_PROVIDER_SITE_OTHER): Payer: 59

## 2017-07-15 ENCOUNTER — Encounter: Payer: 59 | Admitting: Advanced Practice Midwife

## 2017-07-15 ENCOUNTER — Encounter: Payer: Self-pay | Admitting: Obstetrics & Gynecology

## 2017-07-15 ENCOUNTER — Other Ambulatory Visit: Payer: Self-pay

## 2017-07-15 VITALS — BP 128/72 | HR 105 | Wt 184.0 lb

## 2017-07-15 DIAGNOSIS — O0990 Supervision of high risk pregnancy, unspecified, unspecified trimester: Secondary | ICD-10-CM

## 2017-07-15 DIAGNOSIS — Z363 Encounter for antenatal screening for malformations: Secondary | ICD-10-CM | POA: Diagnosis not present

## 2017-07-15 DIAGNOSIS — Z1389 Encounter for screening for other disorder: Secondary | ICD-10-CM

## 2017-07-15 DIAGNOSIS — O0992 Supervision of high risk pregnancy, unspecified, second trimester: Secondary | ICD-10-CM

## 2017-07-15 DIAGNOSIS — O09292 Supervision of pregnancy with other poor reproductive or obstetric history, second trimester: Secondary | ICD-10-CM

## 2017-07-15 DIAGNOSIS — Z3A18 18 weeks gestation of pregnancy: Secondary | ICD-10-CM

## 2017-07-15 DIAGNOSIS — Z331 Pregnant state, incidental: Secondary | ICD-10-CM

## 2017-07-15 DIAGNOSIS — Z8759 Personal history of other complications of pregnancy, childbirth and the puerperium: Secondary | ICD-10-CM

## 2017-07-15 LAB — POCT URINALYSIS DIPSTICK
Glucose, UA: NEGATIVE
Ketones, UA: NEGATIVE
LEUKOCYTES UA: NEGATIVE
NITRITE UA: NEGATIVE
RBC UA: NEGATIVE

## 2017-07-15 MED ORDER — ASPIRIN 81 MG PO CHEW: 162.0000 mg | CHEWABLE_TABLET | Freq: Every day | ORAL | 11 refills | Status: DC

## 2017-07-15 NOTE — Progress Notes (Signed)
US 18+1 wks,cephalic,cervical length 3 cm,anterior pl gr 0,normal ovaries bilat,svp of fluid 5.2 cm,fhr 138 bpm,EFW 234 g 55%,anatomy complete no obvious abnormalities

## 2017-07-15 NOTE — Progress Notes (Signed)
G2P0100 8570w1d Estimated Date of Delivery: 12/15/17  Blood pressure 128/72, pulse (!) 105, weight 184 lb (83.5 kg), last menstrual period 03/16/2017.   BP weight and urine results all reviewed and noted.  Please refer to the obstetrical flow sheet for the fundal height and fetal heart rate documentation:  Patient reports good fetal movement, denies any bleeding and no rupture of membranes symptoms or regular contractions. Patient is without complaints. All questions were answered.  Orders Placed This Encounter  Procedures  . POCT urinalysis dipstick    Plan:  Continued routine obstetrical care, taking 162 mg baby aspirin daily Sonogram is normal  Return in about 4 weeks (around 08/12/2017) for HROB, with Dr Despina HiddenEure.

## 2017-08-12 ENCOUNTER — Ambulatory Visit (INDEPENDENT_AMBULATORY_CARE_PROVIDER_SITE_OTHER): Payer: 59 | Admitting: Obstetrics & Gynecology

## 2017-08-12 ENCOUNTER — Encounter: Payer: Self-pay | Admitting: Obstetrics & Gynecology

## 2017-08-12 DIAGNOSIS — Z331 Pregnant state, incidental: Secondary | ICD-10-CM

## 2017-08-12 DIAGNOSIS — Z3482 Encounter for supervision of other normal pregnancy, second trimester: Secondary | ICD-10-CM

## 2017-08-12 DIAGNOSIS — Z3A22 22 weeks gestation of pregnancy: Secondary | ICD-10-CM

## 2017-08-12 DIAGNOSIS — Z1389 Encounter for screening for other disorder: Secondary | ICD-10-CM

## 2017-08-12 LAB — POCT URINALYSIS DIPSTICK
GLUCOSE UA: NEGATIVE
KETONES UA: NEGATIVE
Leukocytes, UA: NEGATIVE
NITRITE UA: NEGATIVE

## 2017-08-12 NOTE — Progress Notes (Signed)
G2P0100 921w1d Estimated Date of Delivery: 12/15/17  Blood pressure 126/70, pulse (!) 101, weight 186 lb 8 oz (84.6 kg), last menstrual period 03/16/2017.   BP weight and urine results all reviewed and noted.  Please refer to the obstetrical flow sheet for the fundal height and fetal heart rate documentation:  Patient reports good fetal movement, denies any bleeding and no rupture of membranes symptoms or regular contractions. Patient is without complaints. All questions were answered.  Orders Placed This Encounter  Procedures  . POCT urinalysis dipstick    Plan:  Continued routine obstetrical care, PN2 next visit Sonogram at 28-29 weeks, twice weekly testing 32 weeks, deliver 39 weeks  Return in about 1 month (around 09/09/2017) for PN2, HROB.

## 2017-08-20 ENCOUNTER — Telehealth: Payer: Self-pay | Admitting: Obstetrics & Gynecology

## 2017-08-20 NOTE — Telephone Encounter (Signed)
Patient states that last night in the shower she felt two bumps around her anus. She has had some light bleeding in that area after she has a bowel movement.  Informed patient that it sounded like hemorrhoids and she could try adding a stool softner and use witch hazel pads or preparation H.  Patient stated she didn't think it was HSV because "nothing has been in her booty".  Informed patient that we would actually have to see her and swab the area in order to determine if that was the case. Advised to have it looked at when she comes to her next visit.  Verbalized understanding.

## 2017-09-01 ENCOUNTER — Telehealth: Payer: Self-pay | Admitting: Obstetrics & Gynecology

## 2017-09-01 NOTE — Telephone Encounter (Signed)
Patient states she has recently started having a headache with no relief from Tylenol.  She is not having any visual changes or upper gastric pain.  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 Advised to call us back if it is >130/80.  Verbalized understanding.

## 2017-09-09 ENCOUNTER — Encounter: Payer: Self-pay | Admitting: Obstetrics & Gynecology

## 2017-09-09 ENCOUNTER — Other Ambulatory Visit: Payer: 59

## 2017-09-09 ENCOUNTER — Ambulatory Visit (INDEPENDENT_AMBULATORY_CARE_PROVIDER_SITE_OTHER): Payer: 59 | Admitting: Obstetrics & Gynecology

## 2017-09-09 ENCOUNTER — Other Ambulatory Visit: Payer: Self-pay

## 2017-09-09 VITALS — BP 124/68 | HR 107 | Wt 189.0 lb

## 2017-09-09 DIAGNOSIS — Z1389 Encounter for screening for other disorder: Secondary | ICD-10-CM

## 2017-09-09 DIAGNOSIS — Z3A26 26 weeks gestation of pregnancy: Secondary | ICD-10-CM

## 2017-09-09 DIAGNOSIS — Z131 Encounter for screening for diabetes mellitus: Secondary | ICD-10-CM

## 2017-09-09 DIAGNOSIS — O0992 Supervision of high risk pregnancy, unspecified, second trimester: Secondary | ICD-10-CM

## 2017-09-09 DIAGNOSIS — O0990 Supervision of high risk pregnancy, unspecified, unspecified trimester: Secondary | ICD-10-CM

## 2017-09-09 DIAGNOSIS — Z3482 Encounter for supervision of other normal pregnancy, second trimester: Secondary | ICD-10-CM

## 2017-09-09 DIAGNOSIS — Z331 Pregnant state, incidental: Secondary | ICD-10-CM

## 2017-09-09 LAB — POCT URINALYSIS DIPSTICK
Glucose, UA: NEGATIVE
Ketones, UA: NEGATIVE
LEUKOCYTES UA: NEGATIVE
NITRITE UA: NEGATIVE
PROTEIN UA: NEGATIVE
RBC UA: NEGATIVE

## 2017-09-09 NOTE — Progress Notes (Signed)
G2P0100 4032w1d Estimated Date of Delivery: 12/15/17  Blood pressure 124/68, pulse (!) 107, weight 189 lb (85.7 kg), last menstrual period 03/16/2017.   BP weight and urine results all reviewed and noted.  Please refer to the obstetrical flow sheet for the fundal height and fetal heart rate documentation:  Patient reports good fetal movement, denies any bleeding and no rupture of membranes symptoms or regular contractions. Patient is without complaints. All questions were answered.  Orders Placed This Encounter  Procedures  . POCT urinalysis dipstick    Plan:  Continued routine obstetrical care, PN2 today, 32 week scan and then begin twice weekly testing  Return in about 3 weeks (around 09/30/2017) for LROB.

## 2017-09-10 ENCOUNTER — Encounter (INDEPENDENT_AMBULATORY_CARE_PROVIDER_SITE_OTHER): Payer: Self-pay

## 2017-09-10 LAB — HIV ANTIBODY (ROUTINE TESTING W REFLEX): HIV SCREEN 4TH GENERATION: NONREACTIVE

## 2017-09-10 LAB — CBC
Hematocrit: 27.9 % — ABNORMAL LOW (ref 34.0–46.6)
Hemoglobin: 9.4 g/dL — ABNORMAL LOW (ref 11.1–15.9)
MCH: 26.9 pg (ref 26.6–33.0)
MCHC: 33.7 g/dL (ref 31.5–35.7)
MCV: 80 fL (ref 79–97)
Platelets: 288 10*3/uL (ref 150–379)
RBC: 3.5 x10E6/uL — AB (ref 3.77–5.28)
RDW: 14.4 % (ref 12.3–15.4)
WBC: 8 10*3/uL (ref 3.4–10.8)

## 2017-09-10 LAB — GLUCOSE TOLERANCE, 2 HOURS W/ 1HR
GLUCOSE, 1 HOUR: 110 mg/dL (ref 65–179)
GLUCOSE, FASTING: 83 mg/dL (ref 65–91)
Glucose, 2 hour: 131 mg/dL (ref 65–152)

## 2017-09-10 LAB — ANTIBODY SCREEN: ANTIBODY SCREEN: NEGATIVE

## 2017-09-10 LAB — RPR: RPR: NONREACTIVE

## 2017-09-30 ENCOUNTER — Other Ambulatory Visit: Payer: Self-pay

## 2017-09-30 ENCOUNTER — Ambulatory Visit (INDEPENDENT_AMBULATORY_CARE_PROVIDER_SITE_OTHER): Payer: 59 | Admitting: Obstetrics & Gynecology

## 2017-09-30 ENCOUNTER — Encounter: Payer: Self-pay | Admitting: Obstetrics & Gynecology

## 2017-09-30 ENCOUNTER — Encounter: Payer: 59 | Admitting: Obstetrics & Gynecology

## 2017-09-30 VITALS — BP 110/66 | HR 102 | Wt 187.0 lb

## 2017-09-30 DIAGNOSIS — Z1389 Encounter for screening for other disorder: Secondary | ICD-10-CM

## 2017-09-30 DIAGNOSIS — O09299 Supervision of pregnancy with other poor reproductive or obstetric history, unspecified trimester: Secondary | ICD-10-CM

## 2017-09-30 DIAGNOSIS — Z331 Pregnant state, incidental: Secondary | ICD-10-CM

## 2017-09-30 DIAGNOSIS — Z3A29 29 weeks gestation of pregnancy: Secondary | ICD-10-CM

## 2017-09-30 DIAGNOSIS — O0990 Supervision of high risk pregnancy, unspecified, unspecified trimester: Secondary | ICD-10-CM

## 2017-09-30 DIAGNOSIS — O09293 Supervision of pregnancy with other poor reproductive or obstetric history, third trimester: Secondary | ICD-10-CM

## 2017-09-30 DIAGNOSIS — Z8759 Personal history of other complications of pregnancy, childbirth and the puerperium: Secondary | ICD-10-CM

## 2017-09-30 LAB — POCT URINALYSIS DIPSTICK
Glucose, UA: NEGATIVE
Ketones, UA: NEGATIVE
LEUKOCYTES UA: NEGATIVE
NITRITE UA: NEGATIVE

## 2017-09-30 NOTE — Progress Notes (Signed)
.     LOW-RISK PREGNANCY VISIT Patient name: Jacqueline Barrera MRN [id number]  Date of birth: 20-Nov-1995 Chief Complaint:   High Risk Gestation  History of Present Illness:   Jacqueline Barrera is a 22 y.o. G60P0100 female at [redacted]w[redacted]d with an Estimated Date of Delivery: 12/15/17 being seen today for ongoing management of a low-risk pregnancy.  Today she reports left leg sciatica. Contractions: Not present. Vag. Bleeding: None.  Movement: Present. denies leaking of fluid. Review of Systems:   Pertinent items are noted in HPI Denies abnormal vaginal discharge w/ itching/odor/irritation, headaches, visual changes, shortness of breath, chest pain, abdominal pain, severe nausea/vomiting, or problems with urination or bowel movements unless otherwise stated above. Pertinent History Reviewed:  Reviewed past medical,surgical, social, obstetrical and family history.  Reviewed problem list, medications and allergies. Physical Assessment:   Vitals:   09/30/17 1027  BP: 110/66  Pulse: (!) 102  Weight: 187 lb (84.8 kg)  Body mass index is 32.1 kg/m.        Physical Examination:   General appearance: Well appearing, and in no distress  Mental status: Alert, oriented to person, place, and time  Skin: Warm & dry  Cardiovascular: Normal heart rate noted  Respiratory: Normal respiratory effort, no distress  Abdomen: Soft, gravid, nontender  Pelvic: Cervical exam deferred         Extremities: Edema: Trace  Fetal Status: Fetal Heart Rate (bpm): 132 Fundal Height: 30 cm Movement: Present    Results for orders placed or performed in visit on 09/30/17 (from the past 24 hour(s))  POCT urinalysis dipstick   Collection Time: 09/30/17 10:29 AM  Result Value Ref Range   Color, UA     Clarity, UA     Glucose, UA neg    Bilirubin, UA     Ketones, UA neg    Spec Grav, UA  1.010 - 1.025   Blood, UA trace    pH, UA  5.0 - 8.0   Protein, UA trace    Urobilinogen, UA  0.2 or 1.0 E.U./dL   Nitrite, UA neg    Leukocytes, UA Negative Negative   Appearance     Odor      Assessment & Plan:  1) Low-risk pregnancy G2P0100 at [redacted]w[redacted]d with an Estimated Date of Delivery: 12/15/17   2) Hx of IUFD at 32 weeks s/p abruption, severe pre eclampsia, begin twice weekly testing at 32 weeks   Meds: No orders of the defined types were placed in this encounter.  Labs/procedures today: none  Plan:  Continue routine obstetrical care   Reviewed: Preterm labor symptoms and general obstetric precautions including but not limited to vaginal bleeding, contractions, leaking of fluid and fetal movement were reviewed in detail with the patient.  All questions were answered  Follow-up: Return in about 2 weeks (around 10/14/2017) for BPP/sono, LROB, with Dr Despina Hidden.  Orders Placed This Encounter  Procedures  . US Fetal BPP W/O Non Stress  . POCT urinalysis dipstick   Lazaro Arms CNM, [facility] 09/30/2017 10:57 AM

## 2017-10-03 ENCOUNTER — Other Ambulatory Visit: Payer: Self-pay

## 2017-10-03 ENCOUNTER — Ambulatory Visit (INDEPENDENT_AMBULATORY_CARE_PROVIDER_SITE_OTHER): Payer: 59 | Admitting: Obstetrics and Gynecology

## 2017-10-03 ENCOUNTER — Encounter: Payer: Self-pay | Admitting: Obstetrics and Gynecology

## 2017-10-03 VITALS — BP 126/64 | HR 103 | Temp 98.4°F | Wt 187.0 lb

## 2017-10-03 DIAGNOSIS — Z3A29 29 weeks gestation of pregnancy: Secondary | ICD-10-CM

## 2017-10-03 DIAGNOSIS — O0993 Supervision of high risk pregnancy, unspecified, third trimester: Secondary | ICD-10-CM

## 2017-10-03 DIAGNOSIS — Z331 Pregnant state, incidental: Secondary | ICD-10-CM

## 2017-10-03 DIAGNOSIS — Z1389 Encounter for screening for other disorder: Secondary | ICD-10-CM

## 2017-10-03 DIAGNOSIS — Z3482 Encounter for supervision of other normal pregnancy, second trimester: Secondary | ICD-10-CM

## 2017-10-03 LAB — POCT URINALYSIS DIPSTICK
GLUCOSE UA: NEGATIVE
Ketones, UA: NEGATIVE
Leukocytes, UA: NEGATIVE
Nitrite, UA: NEGATIVE
RBC UA: NEGATIVE

## 2017-10-03 NOTE — Progress Notes (Signed)
G2P0100  Estimated Date of Delivery: 12/15/17 LROB [redacted]w[redacted]d  Chief Complaint  Patient presents with  . possible sinus infection    swollen and itching eyes  ____  Patient complaints: Sneezing and sinus congestion symptoms, mild. Patient reports   good fetal movement,                          denies any bleeding , rupture of membranes,or regular contractions.  Blood pressure 126/64, pulse (!) 103, temperature 98.4 F (36.9 C), weight 187 lb (84.8 kg), last menstrual period 03/16/2017.   Urine results:notable for neg refer to the ob flow sheet for FH and FHR, ,                          Physical Examination: General appearance - alert, well appearing, and in no distress                                      Abdomen - FH 136,                                                         -FHR 30                                                         soft, nontender, nondistended, no masses or organomegaly                                      Pelvic -                                             Questions were answered. Assessment: LROB G2P0100 @ [redacted]w[redacted]d low risk OB                         Seasonal allergies Plan:  Continued routine obstetrical care, add claritin or zyrtec  F/u in 2  weeks for  U/s as scheduled, 4 weeks for risk OB

## 2017-10-08 ENCOUNTER — Encounter: Payer: Self-pay | Admitting: Obstetrics & Gynecology

## 2017-10-08 ENCOUNTER — Other Ambulatory Visit: Payer: Self-pay | Admitting: Obstetrics & Gynecology

## 2017-10-08 MED ORDER — SULFAMETHOXAZOLE-TRIMETHOPRIM 800-160 MG PO TABS: 1.0000 | ORAL_TABLET | Freq: Two times a day (BID) | ORAL | 0 refills | Status: DC

## 2017-10-08 MED ORDER — SILVER SULFADIAZINE 1 % EX CREA: TOPICAL_CREAM | CUTANEOUS | 11 refills | Status: DC

## 2017-10-16 ENCOUNTER — Ambulatory Visit (INDEPENDENT_AMBULATORY_CARE_PROVIDER_SITE_OTHER): Payer: 59 | Admitting: Obstetrics & Gynecology

## 2017-10-16 ENCOUNTER — Ambulatory Visit (INDEPENDENT_AMBULATORY_CARE_PROVIDER_SITE_OTHER): Payer: 59

## 2017-10-16 ENCOUNTER — Encounter: Payer: Self-pay | Admitting: Obstetrics & Gynecology

## 2017-10-16 VITALS — BP 120/70 | HR 133 | Wt 186.0 lb

## 2017-10-16 DIAGNOSIS — Z1389 Encounter for screening for other disorder: Secondary | ICD-10-CM

## 2017-10-16 DIAGNOSIS — Z3A31 31 weeks gestation of pregnancy: Secondary | ICD-10-CM

## 2017-10-16 DIAGNOSIS — O0990 Supervision of high risk pregnancy, unspecified, unspecified trimester: Secondary | ICD-10-CM

## 2017-10-16 DIAGNOSIS — O09299 Supervision of pregnancy with other poor reproductive or obstetric history, unspecified trimester: Secondary | ICD-10-CM

## 2017-10-16 DIAGNOSIS — F1210 Cannabis abuse, uncomplicated: Secondary | ICD-10-CM

## 2017-10-16 DIAGNOSIS — Z8759 Personal history of other complications of pregnancy, childbirth and the puerperium: Secondary | ICD-10-CM

## 2017-10-16 DIAGNOSIS — Z331 Pregnant state, incidental: Secondary | ICD-10-CM

## 2017-10-16 DIAGNOSIS — O09293 Supervision of pregnancy with other poor reproductive or obstetric history, third trimester: Secondary | ICD-10-CM

## 2017-10-16 DIAGNOSIS — O99323 Drug use complicating pregnancy, third trimester: Secondary | ICD-10-CM

## 2017-10-16 DIAGNOSIS — O0993 Supervision of high risk pregnancy, unspecified, third trimester: Secondary | ICD-10-CM

## 2017-10-16 LAB — POCT URINALYSIS DIPSTICK
Glucose, UA: NEGATIVE
NITRITE UA: NEGATIVE
PROTEIN UA: POSITIVE — AB

## 2017-10-16 MED ORDER — PROMETHAZINE HCL 25 MG PO TABS: 25.0000 mg | ORAL_TABLET | Freq: Four times a day (QID) | ORAL | 1 refills | Status: DC | PRN

## 2017-10-16 NOTE — Progress Notes (Signed)
   HIGH-RISK PREGNANCY VISIT Patient name: Jacqueline Barrera MRN [id number]  Date of birth: April 30, 1996 Chief Complaint:   High Risk Gestation ([place] today; nausea started last pm; sharp pain in vagina sometimes )  History of Present Illness:   Jacqueline Barrera is a 22 y.o. G18P0100 female at [redacted]w[redacted]d with an Estimated Date of Delivery: 12/15/17 being seen today for ongoing management of a high-risk pregnancy complicated by Hxof IUFD due to abruption secondary to severe pre ecalmpsia.  Today she reports no complaints. Contractions: Not present. Vag. Bleeding: None.  Movement: Present. denies leaking of fluid.  Review of Systems:   Pertinent items are noted in HPI Denies abnormal vaginal discharge w/ itching/odor/irritation, headaches, visual changes, shortness of breath, chest pain, abdominal pain, severe nausea/vomiting, or problems with urination or bowel movements unless otherwise stated above. Pertinent History Reviewed:  Reviewed past medical,surgical, social, obstetrical and family history.  Reviewed problem list, medications and allergies. Physical Assessment:   Vitals:   10/16/17 1049  BP: 120/70  Pulse: (!) 133  Weight: 186 lb (84.4 kg)  Body mass index is 31.93 kg/m.           Physical Examination:   General appearance: alert, well appearing, and in no distress  Mental status: alert, oriented to person, place, and time  Skin: warm & dry   Extremities: Edema: None    Cardiovascular: normal heart rate noted  Respiratory: normal respiratory effort, no distress  Abdomen: gravid, soft, non-tender  Pelvic: Cervical exam deferred         Fetal Status:     Movement: Present    Fetal Surveillance Testing today: BPP 8/8   Results for orders placed or performed in visit on 10/16/17 (from the past 24 hour(s))  POCT urinalysis dipstick   Collection Time: 10/16/17 11:00 AM  Result Value Ref Range   Color, UA     Clarity, UA     Glucose, UA Negative Negative   Bilirubin, UA     Ketones, UA  trace    Spec Grav, UA  1.010 - 1.025   Blood, UA trace    pH, UA  5.0 - 8.0   Protein, UA Positive (A) Negative   Urobilinogen, UA  0.2 or 1.0 E.U./dL   Nitrite, UA neg    Leukocytes, UA Trace (A) Negative   Appearance     Odor      Assessment & Plan:  1) High-risk pregnancy G2P0100 at [redacted]w[redacted]d with an Estimated Date of Delivery: 12/15/17   2) Hx of IUFD(secondary to abruption due to severe pre ecalmspia), stable    Meds:  Meds ordered this encounter  Medications  . promethazine (PHENERGAN) 25 MG tablet    Sig: Take 1 tablet (25 mg total) by mouth every 6 (six) hours as needed for nausea or vomiting.    Dispense:  30 tablet    Refill:  1    Labs/procedures today: BPP 8/8  Treatment Plan:  Begin twice weekly surveillance  Reviewed: Preterm labor symptoms and general obstetric precautions including but not limited to vaginal bleeding, contractions, leaking of fluid and fetal movement were reviewed in detail with the patient.  All questions were answered.  Follow-up: Return in about 4 days (around 10/20/2017) for NST, HROB.  Orders Placed This Encounter  Procedures  . POCT urinalysis dipstick   Lazaro Arms CNM, [facility] 10/16/2017 12:00 PM

## 2017-10-16 NOTE — Progress Notes (Signed)
[place] 31+3 wks,cephalic,BPP 8/8,fhr 135 bpm,cx 2.8 cm,anterior pl gr 2,afi 16 cm

## 2017-10-20 ENCOUNTER — Ambulatory Visit (INDEPENDENT_AMBULATORY_CARE_PROVIDER_SITE_OTHER): Payer: 59 | Admitting: Obstetrics and Gynecology

## 2017-10-20 ENCOUNTER — Encounter: Payer: Self-pay | Admitting: Obstetrics and Gynecology

## 2017-10-20 VITALS — BP 120/70 | HR 99 | Wt 185.0 lb

## 2017-10-20 DIAGNOSIS — O09293 Supervision of pregnancy with other poor reproductive or obstetric history, third trimester: Secondary | ICD-10-CM | POA: Diagnosis not present

## 2017-10-20 DIAGNOSIS — Z3A32 32 weeks gestation of pregnancy: Secondary | ICD-10-CM | POA: Diagnosis not present

## 2017-10-20 DIAGNOSIS — O0990 Supervision of high risk pregnancy, unspecified, unspecified trimester: Secondary | ICD-10-CM

## 2017-10-20 DIAGNOSIS — Z1389 Encounter for screening for other disorder: Secondary | ICD-10-CM

## 2017-10-20 DIAGNOSIS — Z331 Pregnant state, incidental: Secondary | ICD-10-CM

## 2017-10-20 LAB — POCT URINALYSIS DIPSTICK
Blood, UA: NEGATIVE
Glucose, UA: NEGATIVE
Ketones, UA: NEGATIVE
Leukocytes, UA: NEGATIVE
NITRITE UA: NEGATIVE
PROTEIN UA: POSITIVE — AB

## 2017-10-20 NOTE — Progress Notes (Addendum)
Patient ID: Jacqueline PeonKrista Reade, female   DOB: 07-17-1995, 22 y.o.   MRN: [id number]   [facility] Patient name: Jacqueline Barrera MRN [id number]  Date of birth: 07-17-1995 Chief Complaint:   high risk ob (NST/ room # 8)  History of Present Illness:   Jacqueline Barrera is a 22 y.o. 722P0100 female at 4171w0d with an Estimated Date of Delivery: 12/15/17 being seen today for ongoing management of a high-risk pregnancy complicated by history of abruption.  Today she reports no complaints. Contractions: Not present.  .  Movement: Present. denies leaking of fluid.  Review of Systems:   Pertinent items are noted in HPI Denies abnormal vaginal discharge w/ itching/odor/irritation, headaches, visual changes, shortness of breath, chest pain, abdominal pain, severe nausea/vomiting, or problems with urination or bowel movements unless otherwise stated above. Pertinent History Reviewed:  Reviewed past medical,surgical, social, obstetrical and family history.  Reviewed problem list, medications and allergies. Physical Assessment:   Vitals:   10/20/17 1035  BP: 120/70  Pulse: 99  Weight: 185 lb (83.9 kg)  Body mass index is 31.76 kg/m.           Physical Examination:   General appearance: alert, well appearing, and in no distress  Mental status: alert, oriented to person, place, and time  Skin: warm & dry   Extremities: Edema: None    Cardiovascular: normal heart rate noted  Respiratory: normal respiratory effort, no distress  Abdomen: gravid, soft, non-tender  Pelvic: Cervical exam deferred         Fetal Status:     Movement: Present    Fetal Surveillance Testing today: NST reactive 135  Results for orders placed or performed in visit on 10/20/17 (from the past 24 hour(s))  POCT urinalysis dipstick   Collection Time: 10/20/17 10:45 AM  Result Value Ref Range   Color, UA     Clarity, UA     Glucose, UA Negative Negative   Bilirubin, UA     Ketones, UA neg    Spec Grav, UA  1.010 - 1.025     Blood, UA neg    pH, UA  5.0 - 8.0   Protein, UA Positive (A) Negative   Urobilinogen, UA  0.2 or 1.0 E.U./dL   Nitrite, UA neg    Leukocytes, UA Negative Negative   Appearance     Odor      Assessment & Plan:  1) High-risk pregnancy G2P0100 at 8771w0d due to hx severe preTerm PreEclampsia that required delivery at32wk , plus abruption with IUFD  with an Estimated Date of Delivery: 12/15/17   2   Meds: No orders of the defined types were placed in this encounter.   Labs/procedures today: NST  Treatment Plan:  Continue as HROB with twice weekly testing , NST's or BPP.  Reviewed:  labor symptoms and general obstetric precautions including but not limited to vaginal bleeding, contractions, leaking of fluid and fetal movement were reviewed in detail with the patient.  All questions were answered.  Follow-up: No follow-ups on file.  Orders Placed This Encounter  Procedures  . POCT urinalysis dipstick   By signing my name below, I, Arnette NorrisMari Johnson, attest that this documentation has been prepared under the direction and in the presence of Tilda BurrowFerguson, Keta Vanvalkenburgh V, MD. Electronically Signed: Arnette NorrisMari Johnson Medical Scribe. 10/20/17. 11:05 AM.  I personally performed the services described in this documentation, which was SCRIBED in my presence. The recorded information has been reviewed and considered accurate. It has been edited as  necessary during review. Tilda Burrow, MD

## 2017-10-23 ENCOUNTER — Ambulatory Visit (INDEPENDENT_AMBULATORY_CARE_PROVIDER_SITE_OTHER): Payer: 59 | Admitting: Obstetrics & Gynecology

## 2017-10-23 ENCOUNTER — Other Ambulatory Visit: Payer: Self-pay

## 2017-10-23 ENCOUNTER — Encounter: Payer: Self-pay | Admitting: Obstetrics & Gynecology

## 2017-10-23 VITALS — BP 113/68 | HR 110 | Wt 187.0 lb

## 2017-10-23 DIAGNOSIS — Z3A32 32 weeks gestation of pregnancy: Secondary | ICD-10-CM | POA: Diagnosis not present

## 2017-10-23 DIAGNOSIS — Z331 Pregnant state, incidental: Secondary | ICD-10-CM

## 2017-10-23 DIAGNOSIS — O09299 Supervision of pregnancy with other poor reproductive or obstetric history, unspecified trimester: Secondary | ICD-10-CM

## 2017-10-23 DIAGNOSIS — O0993 Supervision of high risk pregnancy, unspecified, third trimester: Secondary | ICD-10-CM

## 2017-10-23 DIAGNOSIS — Z8759 Personal history of other complications of pregnancy, childbirth and the puerperium: Secondary | ICD-10-CM

## 2017-10-23 DIAGNOSIS — O09293 Supervision of pregnancy with other poor reproductive or obstetric history, third trimester: Secondary | ICD-10-CM

## 2017-10-23 DIAGNOSIS — Z1389 Encounter for screening for other disorder: Secondary | ICD-10-CM

## 2017-10-23 LAB — POCT URINALYSIS DIPSTICK
GLUCOSE UA: NEGATIVE
Ketones, UA: NEGATIVE
Leukocytes, UA: NEGATIVE
Nitrite, UA: NEGATIVE
Protein, UA: POSITIVE — AB
RBC UA: NEGATIVE

## 2017-10-23 NOTE — Progress Notes (Signed)
   HIGH-RISK PREGNANCY VISIT Patient name: Jacqueline Barrera MRN [id number]  Date of birth: [birth year] Chief Complaint:   High Risk Gestation (NST)  History of Present Illness:   Jacqueline Barrera is a 22 y.o. 202P0100 female at 368w3d with an Estimated Date of Delivery: 12/15/17 being seen today for ongoing management of a high-risk pregnancy complicated by History of IUFD/severe pre eclapmsia.  Today she reports no complaints. Contractions: Irregular. Vag. Bleeding: None.  Movement: Present. denies leaking of fluid.  Review of Systems:   Pertinent items are noted in HPI Denies abnormal vaginal discharge w/ itching/odor/irritation, headaches, visual changes, shortness of breath, chest pain, abdominal pain, severe nausea/vomiting, or problems with urination or bowel movements unless otherwise stated above. Pertinent History Reviewed:  Reviewed past medical,surgical, social, obstetrical and family history.  Reviewed problem list, medications and allergies. Physical Assessment:   Vitals:   10/23/17 0954  BP: 113/68  Pulse: (!) 110  Weight: 187 lb (84.8 kg)  Body mass index is 32.1 kg/m.           Physical Examination:   General appearance: alert, well appearing, and in no distress  Mental status: alert, oriented to person, place, and time  Skin: warm & dry   Extremities: Edema: None    Cardiovascular: normal heart rate noted  Respiratory: normal respiratory effort, no distress  Abdomen: gravid, soft, non-tender  Pelvic: Cervical exam deferred         Fetal Status:     Movement: Present    Fetal Surveillance Testing today: Reactive NST   Results for orders placed or performed in visit on 10/23/17 (from the past 24 hour(s))  POCT urinalysis dipstick   Collection Time: 10/23/17  9:55 AM  Result Value Ref Range   Color, UA     Clarity, UA     Glucose, UA Negative Negative   Bilirubin, UA     Ketones, UA neg    Spec Grav, UA  1.010 - 1.025   Blood, UA neg    pH, UA  5.0 - 8.0   Protein, UA Positive (A) Negative   Urobilinogen, UA  0.2 or 1.0 E.U./dL   Nitrite, UA neg    Leukocytes, UA Negative Negative   Appearance     Odor      Assessment & Plan:  1) High-risk pregnancy G2P0100 at 668w3d with an Estimated Date of Delivery: 12/15/17   2) Hx of severe pre eclampsia with IUFD, stable, continue 162 mg ASA, twice weekly surveillance, NSTs x 2 next week then begin NST + BPPs    Meds: No orders of the defined types were placed in this encounter.   Labs/procedures today: NST, reactive  Treatment Plan:  Twice weekly surveillance, sonogram alternating with NST, induction at 39 weeks or as clinically indicated   Reviewed: Preterm labor symptoms and general obstetric precautions including but not limited to vaginal bleeding, contractions, leaking of fluid and fetal movement were reviewed in detail with the patient.  All questions were answered.  Follow-up: Return in about 4 days (around 10/27/2017) for NST, HROB.  Orders Placed This Encounter  Procedures  . POCT urinalysis dipstick   Lazaro ArmsLuther H Kahealani Yankovich CNM, [facility]NP-BC 10/23/2017 10:31 AM

## 2017-10-27 ENCOUNTER — Encounter: Payer: Self-pay | Admitting: Obstetrics and Gynecology

## 2017-10-27 ENCOUNTER — Ambulatory Visit (INDEPENDENT_AMBULATORY_CARE_PROVIDER_SITE_OTHER): Payer: 59 | Admitting: Obstetrics and Gynecology

## 2017-10-27 VITALS — BP 128/70 | HR 102 | Wt 190.0 lb

## 2017-10-27 DIAGNOSIS — O0993 Supervision of high risk pregnancy, unspecified, third trimester: Secondary | ICD-10-CM

## 2017-10-27 DIAGNOSIS — O09293 Supervision of pregnancy with other poor reproductive or obstetric history, third trimester: Secondary | ICD-10-CM | POA: Diagnosis not present

## 2017-10-27 DIAGNOSIS — Z1389 Encounter for screening for other disorder: Secondary | ICD-10-CM

## 2017-10-27 DIAGNOSIS — Z3A33 33 weeks gestation of pregnancy: Secondary | ICD-10-CM

## 2017-10-27 DIAGNOSIS — Z331 Pregnant state, incidental: Secondary | ICD-10-CM

## 2017-10-27 LAB — POCT URINALYSIS DIPSTICK
GLUCOSE UA: NEGATIVE
Ketones, UA: NEGATIVE
LEUKOCYTES UA: NEGATIVE
NITRITE UA: NEGATIVE
PROTEIN UA: POSITIVE — AB

## 2017-10-27 NOTE — Progress Notes (Signed)
Patient ID: Jacqueline Barrera, female   DOB: 1996-01-21, 22 y.o.   MRN: [id number]   [facility] Patient name: Jacqueline Barrera MRN [id number]  Date of birth: 1996-01-21 Chief Complaint:   High Risk Gestation (NST)  History of Present Illness:   Jacqueline Barrera is a 22 y.o. 302P0100 female at 1279w0d with an Estimated Date of Delivery: 12/15/17 being seen today for ongoing management of a high-risk pregnancy complicated by History of IUFD/severe pre eclapmsia.. So pt getting 2x/wk testing after 32 wk. Today she reports no complaints. Contractions: Irregular. Vag. Bleeding: None.  Movement: Present. denies leaking of fluid.  Review of Systems:   Pertinent items are noted in HPI Denies abnormal vaginal discharge w/ itching/odor/irritation, headaches, visual changes, shortness of breath, chest pain, abdominal pain, severe nausea/vomiting, or problems with urination or bowel movements unless otherwise stated above. Pertinent History Reviewed:  Reviewed past medical,surgical, social, obstetrical and family history.  Reviewed problem list, medications and allergies. Physical Assessment:   Vitals:   10/27/17 1032  BP: 128/70  Pulse: (!) 102  Weight: 190 lb (86.2 kg)  Body mass index is 32.61 kg/m.           Physical Examination:   General appearance: alert, well appearing, and in no distress and oriented to person, place, and time  Mental status: alert, oriented to person, place, and time, normal mood, behavior, speech, dress, motor activity, and thought processes, affect appropriate to mood  Skin: warm & dry   Extremities: Edema: None    Cardiovascular: normal heart rate noted  Respiratory: normal respiratory effort, no distress  Abdomen: gravid, soft, non-tender  Pelvic: Cervical exam deferred         Fetal Status: Fetal Heart Rate (bpm): 145 Fundal Height: 33 cm Movement: Present    Fetal Surveillance Testing today: NST   Results for orders placed or performed in visit on 10/27/17  (from the past 24 hour(s))  POCT urinalysis dipstick   Collection Time: 10/27/17 10:34 AM  Result Value Ref Range   Color, UA     Clarity, UA     Glucose, UA Negative Negative   Bilirubin, UA     Ketones, UA neg    Spec Grav, UA  1.010 - 1.025   Blood, UA trace    pH, UA  5.0 - 8.0   Protein, UA Positive (A) Negative   Urobilinogen, UA  0.2 or 1.0 E.U./dL   Nitrite, UA neg    Leukocytes, UA Negative Negative   Appearance     Odor      Assessment & Plan:  1) High-risk pregnancy G2P0100 at 6779w0d with an Estimated Date of Delivery: 12/15/17   2) Hx prior IUFD, stable 2x/wk testing, q 4 wk growth u/s   Meds: No orders of the defined types were placed in this encounter.   Labs/procedures today: NST  Treatment Plan:  Continue routine obstetrical care, NST this Thursday.   Follow-up: 3 d NST  Orders Placed This Encounter  Procedures  . POCT urinalysis dipstick  By signing my name below, I, Arnette NorrisMari Johnson, attest that this documentation has been prepared under the direction and in the presence of Tilda BurrowFerguson, Kunta Hilleary V, MD. Electronically Signed: Arnette NorrisMari Johnson Medical Scribe. 10/27/17. 11:15 AM.  I personally performed the services described in this documentation, which was SCRIBED in my presence. The recorded information has been reviewed and considered accurate. It has been edited as necessary during review. Tilda BurrowJohn V Latika Kronick, MD

## 2017-10-30 ENCOUNTER — Encounter: Payer: Self-pay | Admitting: Advanced Practice Midwife

## 2017-10-30 ENCOUNTER — Ambulatory Visit (INDEPENDENT_AMBULATORY_CARE_PROVIDER_SITE_OTHER): Payer: 59 | Admitting: Advanced Practice Midwife

## 2017-10-30 VITALS — BP 118/64 | HR 102 | Wt 187.3 lb

## 2017-10-30 DIAGNOSIS — O09293 Supervision of pregnancy with other poor reproductive or obstetric history, third trimester: Secondary | ICD-10-CM

## 2017-10-30 DIAGNOSIS — Z3A33 33 weeks gestation of pregnancy: Secondary | ICD-10-CM

## 2017-10-30 DIAGNOSIS — Z1389 Encounter for screening for other disorder: Secondary | ICD-10-CM

## 2017-10-30 DIAGNOSIS — Z331 Pregnant state, incidental: Secondary | ICD-10-CM

## 2017-10-30 DIAGNOSIS — Z8759 Personal history of other complications of pregnancy, childbirth and the puerperium: Secondary | ICD-10-CM

## 2017-10-30 DIAGNOSIS — O0993 Supervision of high risk pregnancy, unspecified, third trimester: Secondary | ICD-10-CM

## 2017-10-30 LAB — POCT URINALYSIS DIPSTICK
Blood, UA: NEGATIVE
GLUCOSE UA: POSITIVE — AB
KETONES UA: NEGATIVE
Leukocytes, UA: NEGATIVE
NITRITE UA: NEGATIVE
Protein, UA: POSITIVE — AB

## 2017-10-30 NOTE — Patient Instructions (Signed)
Jacqueline PeonKrista Weisinger, I greatly value your feedback.  If you receive a survey following your visit with us today, we appreciate you taking the time to fill it out.  Thanks, Cathie BeamsFran Cresenzo-Dishmon, CNM   Call the office [phone]) or go to [facility] if:  You begin to have strong, frequent contractions  Your water breaks.  Sometimes it is a big gush of fluid, sometimes it is just a trickle that keeps getting your panties wet or running down your legs  You have vaginal bleeding.  It is normal to have a small amount of spotting if your cervix was checked.   You don't feel your baby moving like normal.  If you don't, get you something to eat and drink and lay down and focus on feeling your baby move.  You should feel at least 10 movements in 2 hours.  If you don't, you should call the office or go to [facility].    Tdap Vaccine  It is recommended that you get the Tdap vaccine during the third trimester of EACH pregnancy to help protect your baby from getting pertussis (whooping cough)  27-36 weeks is the BEST time to do this so that you can pass the protection on to your baby. During pregnancy is better than after pregnancy, but if you are unable to get it during pregnancy it will be offered at the hospital.   You will be offered this vaccine in the office after 27 weeks. If you do not have health insurance, you can get this vaccine at the health department or your family doctor  Everyone who will be around your baby should also be up-to-date on their vaccines. Adults (who are not pregnant) only need 1 dose of Tdap during adulthood.   Third Trimester of Pregnancy The third trimester is from week 29 through week 42, months 7 through 9. The third trimester is a time when the fetus is growing rapidly. At the end of the ninth month, the fetus is about 20 inches in length and weighs 6-10 pounds.  BODY CHANGES Your body goes through many changes during pregnancy. The changes vary from woman to  woman.   Your weight will continue to increase. You can expect to gain 25-35 pounds (11-16 kg) by the end of the pregnancy.  You may begin to get stretch marks on your hips, abdomen, and breasts.  You may urinate more often because the fetus is moving lower into your pelvis and pressing on your bladder.  You may develop or continue to have heartburn as a result of your pregnancy.  You may develop constipation because certain hormones are causing the muscles that push waste through your intestines to slow down.  You may develop hemorrhoids or swollen, bulging veins (varicose veins).  You may have pelvic pain because of the weight gain and pregnancy hormones relaxing your joints between the bones in your pelvis. Backaches may result from overexertion of the muscles supporting your posture.  You may have changes in your hair. These can include thickening of your hair, rapid growth, and changes in texture. Some women also have hair loss during or after pregnancy, or hair that feels dry or thin. Your hair will most likely return to normal after your baby is born.  Your breasts will continue to grow and be tender. A yellow discharge may leak from your breasts called colostrum.  Your belly button may stick out.  You may feel short of breath because of your expanding uterus.  You  may notice the fetus "dropping," or moving lower in your abdomen.  You may have a bloody mucus discharge. This usually occurs a few days to a week before labor begins.  Your cervix becomes thin and soft (effaced) near your due date. WHAT TO EXPECT AT YOUR PRENATAL EXAMS  You will have prenatal exams every 2 weeks until week 36. Then, you will have weekly prenatal exams. During a routine prenatal visit:  You will be weighed to make sure you and the fetus are growing normally.  Your blood pressure is taken.  Your abdomen will be measured to track your baby's growth.  The fetal heartbeat will be listened  to.  Any test results from the previous visit will be discussed.  You may have a cervical check near your due date to see if you have effaced. At around 36 weeks, your caregiver will check your cervix. At the same time, your caregiver will also perform a test on the secretions of the vaginal tissue. This test is to determine if a type of bacteria, Group B streptococcus, is present. Your caregiver will explain this further. Your caregiver may ask you:  What your birth plan is.  How you are feeling.  If you are feeling the baby move.  If you have had any abnormal symptoms, such as leaking fluid, bleeding, severe headaches, or abdominal cramping.  If you have any questions. Other tests or screenings that may be performed during your third trimester include:  Blood tests that check for low iron levels (anemia).  Fetal testing to check the health, activity level, and growth of the fetus. Testing is done if you have certain medical conditions or if there are problems during the pregnancy. FALSE LABOR You may feel small, irregular contractions that eventually go away. These are called Braxton Hicks contractions, or false labor. Contractions may last for hours, days, or even weeks before true labor sets in. If contractions come at regular intervals, intensify, or become painful, it is best to be seen by your caregiver.  SIGNS OF LABOR   Menstrual-like cramps.  Contractions that are 5 minutes apart or less.  Contractions that start on the top of the uterus and spread down to the lower abdomen and back.  A sense of increased pelvic pressure or back pain.  A watery or bloody mucus discharge that comes from the vagina. If you have any of these signs before the 37th week of pregnancy, call your caregiver right away. You need to go to the hospital to get checked immediately. HOME CARE INSTRUCTIONS   Avoid all smoking, herbs, alcohol, and unprescribed drugs. These chemicals affect the  formation and growth of the baby.  Follow your caregiver's instructions regarding medicine use. There are medicines that are either safe or unsafe to take during pregnancy.  Exercise only as directed by your caregiver. Experiencing uterine cramps is a good sign to stop exercising.  Continue to eat regular, healthy meals.  Wear a good support bra for breast tenderness.  Do not use hot tubs, steam rooms, or saunas.  Wear your seat belt at all times when driving.  Avoid raw meat, uncooked cheese, cat litter boxes, and soil used by cats. These carry germs that can cause birth defects in the baby.  Take your prenatal vitamins.  Try taking a stool softener (if your caregiver approves) if you develop constipation. Eat more high-fiber foods, such as fresh vegetables or fruit and whole grains. Drink plenty of fluids to keep your urine clear  or pale yellow.  Take warm sitz baths to soothe any pain or discomfort caused by hemorrhoids. Use hemorrhoid cream if your caregiver approves.  If you develop varicose veins, wear support hose. Elevate your feet for 15 minutes, 3-4 times a day. Limit salt in your diet.  Avoid heavy lifting, wear low heal shoes, and practice good posture.  Rest a lot with your legs elevated if you have leg cramps or low back pain.  Visit your dentist if you have not gone during your pregnancy. Use a soft toothbrush to brush your teeth and be gentle when you floss.  A sexual relationship may be continued unless your caregiver directs you otherwise.  Do not travel far distances unless it is absolutely necessary and only with the approval of your caregiver.  Take prenatal classes to understand, practice, and ask questions about the labor and delivery.  Make a trial run to the hospital.  Pack your hospital bag.  Prepare the baby's nursery.  Continue to go to all your prenatal visits as directed by your caregiver. SEEK MEDICAL CARE IF:  You are unsure if you are in  labor or if your water has broken.  You have dizziness.  You have mild pelvic cramps, pelvic pressure, or nagging pain in your abdominal area.  You have persistent nausea, vomiting, or diarrhea.  You have a bad smelling vaginal discharge.  You have pain with urination. SEEK IMMEDIATE MEDICAL CARE IF:   You have a fever.  You are leaking fluid from your vagina.  You have spotting or bleeding from your vagina.  You have severe abdominal cramping or pain.  You have rapid weight loss or gain.  You have shortness of breath with chest pain.  You notice sudden or extreme swelling of your face, hands, ankles, feet, or legs.  You have not felt your baby move in over an hour.  You have severe headaches that do not go away with medicine.  You have vision changes. Document Released: 04/30/2001 Document Revised: 05/11/2013 Document Reviewed: 07/07/2012 [facility] Patient Information 2015 [place], [place]. This information is not intended to replace advice given to you by your health care provider. Make sure you discuss any questions you have with your health care provider.

## 2017-10-30 NOTE — Progress Notes (Signed)
HIGH-RISK PREGNANCY VISIT Patient name: Jacqueline Barrera MRN [id number]  Date of birth: [birth year] Chief Complaint:   High Risk Gestation (NST/ room # 8)  History of Present Illness:   Jacqueline Barrera is a 22 y.o. 782P0100 female at [redacted]w[redacted]d with an Estimated Date of Delivery: 12/15/17 being seen today for ongoing management of a high-risk pregnancy complicated by hx of severe PreE/IUFD.  Today she reports no complaints. Contractions: Irritability.  .  Movement: Present. denies leaking of fluid.  Review of Systems:   Pertinent items are noted in HPI Denies abnormal vaginal discharge w/ itching/odor/irritation, headaches, visual changes, shortness of breath, chest pain, abdominal pain, severe nausea/vomiting, or problems with urination or bowel movements unless otherwise stated above.    Pertinent History Reviewed:  Medical & Surgical Hx:   Past Medical History:  Diagnosis Date  . Anxiety   . Scoliosis    Past Surgical History:  Procedure Laterality Date  . ADENOIDECTOMY    . TONSILLECTOMY AND ADENOIDECTOMY    . WISDOM TOOTH EXTRACTION     Family History  Problem Relation Age of Onset  . Hyperlipidemia Maternal Aunt   . Hypertension Maternal Grandmother     Current Outpatient Medications:  .  aspirin (ASPIRIN CHILDRENS) 81 MG chewable tablet, Chew 2 tablets (162 mg total) by mouth daily. Take 1 daily starting at 12 weeks, Disp: 30 tablet, Rfl: 11 .  Prenatal Vit-Fe Fumarate-FA (MULTIVITAMIN-PRENATAL) 27-0.8 MG TABS tablet, Take 2 tablets by mouth daily at 12 noon. , Disp: , Rfl:  .  promethazine (PHENERGAN) 25 MG tablet, Take 1 tablet (25 mg total) by mouth every 6 (six) hours as needed for nausea or vomiting., Disp: 30 tablet, Rfl: 1 .  silver sulfADIAZINE (SILVADENE) 1 % cream, Apply to area 3-4 times per day (Patient not taking: Reported on 10/27/2017), Disp: 50 g, Rfl: 11 Social History: Reviewed -  reports that she quit smoking about 3 years ago. Her smoking use included cigars. She has a  0.30 pack-year smoking history. She has never used smokeless tobacco.   Physical Assessment:   Vitals:   10/30/17 1019  BP: 118/64  Pulse: (!) 102  Weight: 187 lb 4.8 oz (85 kg)  Body mass index is 32.15 kg/m.           Physical Examination:   General appearance: alert, well appearing, and in no distress  Mental status: alert, oriented to person, place, and time  Skin: warm & dry   Extremities: Edema: None    Cardiovascular: normal heart rate noted  Respiratory: normal respiratory effort, no distress  Abdomen: gravid, soft, non-tender  Pelvic: Cervical exam deferred         Fetal Status: Fetal Heart Rate (bpm): 135   Movement: Present    Fetal Surveillance Testing today: NST: FHR baseline 135 bpm, Variability: moderate, Accelerations:present, Decelerations:  Absent= Cat 1/Reactive   Results for orders placed or performed in visit on 10/30/17 (from the past 24 hour(s))  POCT urinalysis dipstick   Collection Time: 10/30/17 10:22 AM  Result Value Ref Range   Color, UA     Clarity, UA     Glucose, UA Positive (A) Negative   Bilirubin, UA     Ketones, UA neg    Spec Grav, UA  1.010 - 1.025   Blood, UA neg    pH, UA  5.0 - 8.0   Protein, UA Positive (A) Negative   Urobilinogen, UA  0.2 or 1.0 E.U./dL   Nitrite, UA neg  Leukocytes, UA Negative Negative   Appearance     Odor      Assessment & Plan:  1) High-risk pregnancy G2P0100 at [redacted]w[redacted]d with an Estimated Date of Delivery: 12/15/17   2) hx severe preE/IUFD,   3) ,   Labs/procedures today:   Medications: ASA qd  Treatment Plan:  Monthly growth [place], twice weekly NST (doesn't want weekly BPP), IOL 39 weeks  Follow-up: Return for Mon/Thurs NST/HROB; growth [place] asap on one of those days.  Orders Placed This Encounter  Procedures  . US OB Follow Up  . POCT urinalysis dipstick   [name] CNM 10/30/2017 11:00 AM

## 2017-11-03 ENCOUNTER — Encounter: Payer: Self-pay | Admitting: Obstetrics & Gynecology

## 2017-11-03 ENCOUNTER — Other Ambulatory Visit: Payer: Self-pay

## 2017-11-03 ENCOUNTER — Ambulatory Visit (INDEPENDENT_AMBULATORY_CARE_PROVIDER_SITE_OTHER): Payer: 59 | Admitting: Obstetrics & Gynecology

## 2017-11-03 VITALS — BP 132/80 | HR 111 | Wt 184.0 lb

## 2017-11-03 DIAGNOSIS — O09293 Supervision of pregnancy with other poor reproductive or obstetric history, third trimester: Secondary | ICD-10-CM

## 2017-11-03 DIAGNOSIS — O0993 Supervision of high risk pregnancy, unspecified, third trimester: Secondary | ICD-10-CM

## 2017-11-03 DIAGNOSIS — O09299 Supervision of pregnancy with other poor reproductive or obstetric history, unspecified trimester: Secondary | ICD-10-CM

## 2017-11-03 DIAGNOSIS — Z3A34 34 weeks gestation of pregnancy: Secondary | ICD-10-CM | POA: Diagnosis not present

## 2017-11-03 DIAGNOSIS — Z8759 Personal history of other complications of pregnancy, childbirth and the puerperium: Secondary | ICD-10-CM

## 2017-11-03 DIAGNOSIS — Z331 Pregnant state, incidental: Secondary | ICD-10-CM

## 2017-11-03 DIAGNOSIS — Z1389 Encounter for screening for other disorder: Secondary | ICD-10-CM

## 2017-11-03 LAB — POCT URINALYSIS DIPSTICK
GLUCOSE UA: NEGATIVE
Ketones, UA: NEGATIVE
LEUKOCYTES UA: NEGATIVE
NITRITE UA: NEGATIVE
Protein, UA: POSITIVE — AB
RBC UA: NEGATIVE

## 2017-11-03 NOTE — Progress Notes (Signed)
   HIGH-RISK PREGNANCY VISIT Patient name: Jacqueline Barrera MRN [id number]  Date of birth: 02-Aug-1995 Chief Complaint:   High Risk Gestation (NST)  History of Present Illness:   Jacqueline PeonKrista Korver is a 22 y.o. 572P0100 female at 4077w0d with an Estimated Date of Delivery: 12/15/17 being seen today for ongoing management of a high-risk pregnancy complicated by Hx of IUFD at 32 weeks due to abruption /severe pre ecalpmsia.  Today she reports no complaints. Contractions: Irregular. Vag. Bleeding: None.  Movement: Present. denies leaking of fluid.  Review of Systems:   Pertinent items are noted in HPI Denies abnormal vaginal discharge w/ itching/odor/irritation, headaches, visual changes, shortness of breath, chest pain, abdominal pain, severe nausea/vomiting, or problems with urination or bowel movements unless otherwise stated above. Pertinent History Reviewed:  Reviewed past medical,surgical, social, obstetrical and family history.  Reviewed problem list, medications and allergies. Physical Assessment:   Vitals:   11/03/17 1045  BP: 132/80  Pulse: (!) 111  Weight: 184 lb (83.5 kg)  Body mass index is 31.58 kg/m.           Physical Examination:   General appearance: alert, well appearing, and in no distress  Mental status: alert, oriented to person, place, and time  Skin: warm & dry   Extremities: Edema: None    Cardiovascular: normal heart rate noted  Respiratory: normal respiratory effort, no distress  Abdomen: gravid, soft, non-tender  Pelvic: Cervical exam deferred         Fetal Status:     Movement: Present    Fetal Surveillance Testing today: Reactive NST   Results for orders placed or performed in visit on 11/03/17 (from the past 24 hour(s))  POCT urinalysis dipstick   Collection Time: 11/03/17 10:47 AM  Result Value Ref Range   Color, UA     Clarity, UA     Glucose, UA Negative Negative   Bilirubin, UA     Ketones, UA neg    Spec Grav, UA  1.010 - 1.025   Blood, UA neg    pH, UA  5.0 - 8.0   Protein, UA Positive (A) Negative   Urobilinogen, UA  0.2 or 1.0 E.U./dL   Nitrite, UA neg    Leukocytes, UA Negative Negative   Appearance     Odor      Assessment & Plan:  1) High-risk pregnancy G2P0100 at 8077w0d with an Estimated Date of Delivery: 12/15/17   2) Hx of IUFD + severe pre eclampsia/abruption, stable, taking 162 mg daily    Meds: No orders of the defined types were placed in this encounter.   Labs/procedures today: Reactive NST  Treatment Plan:  Twice weekly surveillance, IOL 39 weeks or as clinically indicated  Reviewed: Preterm labor symptoms and general obstetric precautions including but not limited to vaginal bleeding, contractions, leaking of fluid and fetal movement were reviewed in detail with the patient.  All questions were answered.  Follow-up: No follow-ups on file.  Orders Placed This Encounter  Procedures  . POCT urinalysis dipstick   Lazaro ArmsLuther H Eure CNM, [facility]NP-BC 11/03/2017 11:29 AM

## 2017-11-06 ENCOUNTER — Encounter: Payer: Self-pay | Admitting: Obstetrics and Gynecology

## 2017-11-06 ENCOUNTER — Ambulatory Visit (INDEPENDENT_AMBULATORY_CARE_PROVIDER_SITE_OTHER): Payer: 59 | Admitting: Obstetrics and Gynecology

## 2017-11-06 VITALS — BP 118/61 | HR 110 | Wt 184.8 lb

## 2017-11-06 DIAGNOSIS — O09293 Supervision of pregnancy with other poor reproductive or obstetric history, third trimester: Secondary | ICD-10-CM | POA: Diagnosis not present

## 2017-11-06 DIAGNOSIS — Z331 Pregnant state, incidental: Secondary | ICD-10-CM

## 2017-11-06 DIAGNOSIS — Z3A34 34 weeks gestation of pregnancy: Secondary | ICD-10-CM | POA: Diagnosis not present

## 2017-11-06 DIAGNOSIS — Z8759 Personal history of other complications of pregnancy, childbirth and the puerperium: Secondary | ICD-10-CM

## 2017-11-06 DIAGNOSIS — O0990 Supervision of high risk pregnancy, unspecified, unspecified trimester: Secondary | ICD-10-CM

## 2017-11-06 DIAGNOSIS — O09299 Supervision of pregnancy with other poor reproductive or obstetric history, unspecified trimester: Secondary | ICD-10-CM

## 2017-11-06 DIAGNOSIS — Z1389 Encounter for screening for other disorder: Secondary | ICD-10-CM

## 2017-11-06 LAB — POCT URINALYSIS DIPSTICK
Blood, UA: NEGATIVE
GLUCOSE UA: NEGATIVE
KETONES UA: NEGATIVE
Leukocytes, UA: NEGATIVE
Nitrite, UA: NEGATIVE
Protein, UA: POSITIVE — AB

## 2017-11-06 NOTE — Progress Notes (Signed)
Patient ID: Jacqueline Barrera, female   DOB: 22-Jan-1996, 22 y.o.   MRN: [id number]   [facility] Patient name: Jacqueline Barrera MRN [id number]  Date of birth: 22-Jan-1996 Chief Complaint:   High Risk Gestation (NST; + pressure; ? braxton hicks contractions)  History of Present Illness:   Jacqueline Barrera is a 22 y.o. 652P0100 female at 3469w3d with an Estimated Date of Delivery: 12/15/17 being seen today for ongoing management of a high-risk pregnancy complicated by Hx of IUFD at 32 weeks due to abruption /severe pre ecalpmsia.  Today she reports no complaints.   Contractions: Irregular. Vag. Bleeding: None.  Movement: Present. denies leaking of fluid.  Review of Systems:   Pertinent items are noted in HPI Denies abnormal vaginal discharge w/ itching/odor/irritation, headaches, visual changes, shortness of breath, chest pain, abdominal pain, severe nausea/vomiting, or problems with urination or bowel movements unless otherwise stated above. Pertinent History Reviewed:  Reviewed past medical,surgical, social, obstetrical and family history.  Reviewed problem list, medications and allergies. Physical Assessment:   Vitals:   11/06/17 0935  BP: 118/61  Pulse: (!) 110  Weight: 184 lb 12.8 oz (83.8 kg)  Body mass index is 31.72 kg/m.           Physical Examination:   General appearance: alert, well appearing, and in no distress and oriented to person, place, and time  Mental status: alert, oriented to person, place, and time, normal mood, behavior, speech, dress, motor activity, and thought processes, affect appropriate to mood  Skin: warm & dry   Extremities: Edema: None    Cardiovascular: normal heart rate noted  Respiratory: normal respiratory effort, no distress  Abdomen: gravid, soft, non-tender  Pelvic: Cervical exam deferred         Fetal Status: Fetal Heart Rate (bpm): 130-160   Movement: Present    Fetal Surveillance Testing today: NST   Results for orders placed or performed  in visit on 11/06/17 (from the past 24 hour(s))  POCT urinalysis dipstick   Collection Time: 11/06/17  9:36 AM  Result Value Ref Range   Color, UA     Clarity, UA     Glucose, UA Negative Negative   Bilirubin, UA     Ketones, UA neg    Spec Grav, UA  1.010 - 1.025   Blood, UA neg    pH, UA  5.0 - 8.0   Protein, UA Positive (A) Negative   Urobilinogen, UA  0.2 or 1.0 E.U./dL   Nitrite, UA neg    Leukocytes, UA Negative Negative   Appearance     Odor      Assessment & Plan:  1) High-risk pregnancy G2P0100 at 1869w3d with an Estimated Date of Delivery: 12/15/17   2) Hx of IUFD + severe preeclampsia/ abruption, stable  Meds: No orders of the defined types were placed in this encounter.   Labs/procedures today: NST  Treatment Plan:  2x wk testing, IOL @ 39 weeks or as clinically indicated.   Follow-up: No follow-ups on file.  Orders Placed This Encounter  Procedures   POCT urinalysis dipstick     By signing my name below, I, Diona BrownerJennifer Gorman, attest that this documentation has been prepared under the direction and in the presence of Tilda BurrowFerguson, John V, MD. Electronically Signed: Diona BrownerJennifer Gorman, Medical Scribe. 11/06/17. 10:05 AM.  I personally performed the services described in this documentation, which was SCRIBED in my presence. The recorded information has been reviewed and considered accurate. It has been edited as necessary  during review. Tilda Burrow, MD

## 2017-11-10 ENCOUNTER — Ambulatory Visit (INDEPENDENT_AMBULATORY_CARE_PROVIDER_SITE_OTHER): Payer: 59 | Admitting: Obstetrics & Gynecology

## 2017-11-10 ENCOUNTER — Encounter: Payer: Self-pay | Admitting: Obstetrics & Gynecology

## 2017-11-10 VITALS — BP 117/60 | HR 90 | Wt 186.3 lb

## 2017-11-10 DIAGNOSIS — Z3A35 35 weeks gestation of pregnancy: Secondary | ICD-10-CM

## 2017-11-10 DIAGNOSIS — O09299 Supervision of pregnancy with other poor reproductive or obstetric history, unspecified trimester: Secondary | ICD-10-CM | POA: Diagnosis not present

## 2017-11-10 DIAGNOSIS — Z331 Pregnant state, incidental: Secondary | ICD-10-CM

## 2017-11-10 DIAGNOSIS — O0993 Supervision of high risk pregnancy, unspecified, third trimester: Secondary | ICD-10-CM

## 2017-11-10 DIAGNOSIS — Z8759 Personal history of other complications of pregnancy, childbirth and the puerperium: Secondary | ICD-10-CM

## 2017-11-10 DIAGNOSIS — Z1389 Encounter for screening for other disorder: Secondary | ICD-10-CM

## 2017-11-10 LAB — POCT URINALYSIS DIPSTICK
GLUCOSE UA: NEGATIVE
KETONES UA: NEGATIVE
Leukocytes, UA: NEGATIVE
Nitrite, UA: NEGATIVE
Protein, UA: POSITIVE — AB
RBC UA: NEGATIVE

## 2017-11-10 NOTE — Progress Notes (Signed)
   HIGH-RISK PREGNANCY VISIT Patient name: Jacqueline Barrera MRN [id number]  Date of birth: [birth year] Chief Complaint:   high risk ob (NST)  History of Present Illness:   Jacqueline PeonKrista Shareef is a 22 y.o. 662P0100 female at 8118w0d with an Estimated Date of Delivery: 12/15/17 being seen today for ongoing management of a high-risk pregnancy complicated by History of IUFD.  Today she reports no complaints. Contractions: Irregular.  .  Movement: Present. denies leaking of fluid.  Review of Systems:   Pertinent items are noted in HPI Denies abnormal vaginal discharge w/ itching/odor/irritation, headaches, visual changes, shortness of breath, chest pain, abdominal pain, severe nausea/vomiting, or problems with urination or bowel movements unless otherwise stated above. Pertinent History Reviewed:  Reviewed past medical,surgical, social, obstetrical and family history.  Reviewed problem list, medications and allergies. Physical Assessment:   Vitals:   11/10/17 0917  BP: 117/60  Pulse: 90  Weight: 186 lb 4.8 oz (84.5 kg)  Body mass index is 31.98 kg/m.           Physical Examination:   General appearance: alert, well appearing, and in no distress  Mental status: alert, oriented to person, place, and time  Skin: warm & dry   Extremities: Edema: None    Cardiovascular: normal heart rate noted  Respiratory: normal respiratory effort, no distress  Abdomen: gravid, soft, non-tender  Pelvic: Cervical exam deferred         Fetal Status: Fetal Heart Rate (bpm): 140 Fundal Height: 34 cm Movement: Present    Fetal Surveillance Testing today: Reactive NST  Results for orders placed or performed in visit on 11/10/17 (from the past 24 hour(s))  POCT urinalysis dipstick   Collection Time: 11/10/17  9:19 AM  Result Value Ref Range   Color, UA     Clarity, UA     Glucose, UA Negative Negative   Bilirubin, UA     Ketones, UA neg    Spec Grav, UA  1.010 - 1.025   Blood, UA neg    pH, UA  5.0 - 8.0   Protein, UA Positive (A) Negative   Urobilinogen, UA  0.2 or 1.0 E.U./dL   Nitrite, UA neg    Leukocytes, UA Negative Negative   Appearance     Odor      Assessment & Plan:  1) High-risk pregnancy G2P0100 at 2318w0d with an Estimated Date of Delivery: 12/15/17   2) history of intrauterine fetal demise due to severe preeclampsia, stable, home aspirin 162 mg daily    Meds: No orders of the defined types were placed in this encounter.   Labs/procedures today:   Treatment Plan:  Twice weekly fetal surveillance, NSTs are fine unless BP is an issue, sonogram EFW at 36-37 weeks + BPP  Reviewed: Preterm labor symptoms and general obstetric precautions including but not limited to vaginal bleeding, contractions, leaking of fluid and fetal movement were reviewed in detail with the patient.  All questions were answered.  Follow-up: Return in about 3 days (around 11/13/2017) for NST on 6/27 + HROB; Sonogram 11/17/2017 + HROB, NST, HROB.  Orders Placed This Encounter  Procedures  . US Fetal BPP W/O Non Stress  . US OB Follow Up  . POCT urinalysis dipstick   Lazaro ArmsLuther H Shonna Deiter  11/10/2017 10:47 AM

## 2017-11-13 ENCOUNTER — Ambulatory Visit (INDEPENDENT_AMBULATORY_CARE_PROVIDER_SITE_OTHER): Payer: 59 | Admitting: Obstetrics and Gynecology

## 2017-11-13 VITALS — BP 125/66 | HR 105 | Wt 185.0 lb

## 2017-11-13 DIAGNOSIS — O09291 Supervision of pregnancy with other poor reproductive or obstetric history, first trimester: Secondary | ICD-10-CM

## 2017-11-13 DIAGNOSIS — Z331 Pregnant state, incidental: Secondary | ICD-10-CM

## 2017-11-13 DIAGNOSIS — Z3A35 35 weeks gestation of pregnancy: Secondary | ICD-10-CM | POA: Diagnosis not present

## 2017-11-13 DIAGNOSIS — Z1389 Encounter for screening for other disorder: Secondary | ICD-10-CM

## 2017-11-13 DIAGNOSIS — O0990 Supervision of high risk pregnancy, unspecified, unspecified trimester: Secondary | ICD-10-CM

## 2017-11-13 DIAGNOSIS — O0993 Supervision of high risk pregnancy, unspecified, third trimester: Secondary | ICD-10-CM

## 2017-11-13 LAB — POCT URINALYSIS DIPSTICK
Glucose, UA: NEGATIVE
KETONES UA: NEGATIVE
NITRITE UA: NEGATIVE
PROTEIN UA: POSITIVE — AB
RBC UA: NEGATIVE

## 2017-11-13 NOTE — Progress Notes (Signed)
High Risk Pregnancy HROB Diagnosis(es):   History of IUFD at 32 weeks due to abruption with severe preeclampsia, this pregnancy has been normal to date, followed on high risk protocol due to history of IUFD  G2P0100 828w3d Estimated Date of Delivery: 12/15/17    HPI: The patient is being seen today for ongoing management of see above. Today she reports good fetal movement, no headache no concerns Patient reports good fetal movement, denies any bleeding and no rupture of membranes symptoms or regular contractions.   BP weight and urine results reviewed and noted. Blood pressure 125/66, pulse (!) 105, weight 185 lb (83.9 kg), last menstrual period 03/16/2017.  Fundal Height:  35 Fetal Heart rate:  130 Physical Examination: Abdomen -nontender abdomen gravid uterus consistent with dates                                     Pelvic -                                      Edema:  min  Urinalysis: Trace for protein                   Fetal Surveillance Testing today: NST reactive  Lab urine results have been reviewed. Comments: normal   Assessment:  1.  Pregnancy at 468w3d,  G2P0100   : Reactive NST                        2.  History of IUFD, history of severe preeclampsia with abruption                        3.   Medication(s) Plans: Baby aspirin to continue  Treatment Plan: Twice weekly testing, delivery at 39 weeks  Follow up in 0.5 weeks for appointment for high risk OB care, BPP Follow-up 1 week for NST

## 2017-11-17 ENCOUNTER — Ambulatory Visit (INDEPENDENT_AMBULATORY_CARE_PROVIDER_SITE_OTHER): Payer: 59 | Admitting: Obstetrics & Gynecology

## 2017-11-17 ENCOUNTER — Encounter: Payer: Self-pay | Admitting: Obstetrics & Gynecology

## 2017-11-17 ENCOUNTER — Other Ambulatory Visit: Payer: 59 | Admitting: Obstetrics & Gynecology

## 2017-11-17 ENCOUNTER — Ambulatory Visit (INDEPENDENT_AMBULATORY_CARE_PROVIDER_SITE_OTHER): Payer: 59

## 2017-11-17 ENCOUNTER — Other Ambulatory Visit: Payer: Self-pay

## 2017-11-17 VITALS — BP 115/66 | HR 88 | Wt 187.0 lb

## 2017-11-17 DIAGNOSIS — O09293 Supervision of pregnancy with other poor reproductive or obstetric history, third trimester: Secondary | ICD-10-CM | POA: Diagnosis not present

## 2017-11-17 DIAGNOSIS — Z3A36 36 weeks gestation of pregnancy: Secondary | ICD-10-CM | POA: Diagnosis not present

## 2017-11-17 DIAGNOSIS — O0993 Supervision of high risk pregnancy, unspecified, third trimester: Secondary | ICD-10-CM

## 2017-11-17 DIAGNOSIS — Z1389 Encounter for screening for other disorder: Secondary | ICD-10-CM

## 2017-11-17 DIAGNOSIS — Z331 Pregnant state, incidental: Secondary | ICD-10-CM

## 2017-11-17 DIAGNOSIS — O0990 Supervision of high risk pregnancy, unspecified, unspecified trimester: Secondary | ICD-10-CM

## 2017-11-17 DIAGNOSIS — Z8759 Personal history of other complications of pregnancy, childbirth and the puerperium: Secondary | ICD-10-CM

## 2017-11-17 LAB — POCT URINALYSIS DIPSTICK
Glucose, UA: NEGATIVE
PROTEIN UA: POSITIVE — AB

## 2017-11-17 NOTE — Progress Notes (Signed)
US 36 wks,cephalic,BPP 8/8,FHR 144 BPM,anterior pl gr 3,normal ovaries bilat,fhr 15 cm,EFW 2877 g 57%

## 2017-11-17 NOTE — Progress Notes (Signed)
   HIGH-RISK PREGNANCY VISIT Patient name: Jacqueline Barrera MRN [id number]  Date of birth: 01/12/1996 Chief Complaint:   High Risk Gestation (gbs/gc today)  History of Present Illness:   Jacqueline Barrera is a 22 y.o. 502P0100 female at 2365w0d with an Estimated Date of Delivery: 12/15/17 being seen today for ongoing management of a high-risk pregnancy complicated by Hx of IUFD due to abruption/severe pre eclmapia.  Today she reports no complaints. Contractions: Irregular. Vag. Bleeding: None.  Movement: Present. denies leaking of fluid.  Review of Systems:   Pertinent items are noted in HPI Denies abnormal vaginal discharge w/ itching/odor/irritation, headaches, visual changes, shortness of breath, chest pain, abdominal pain, severe nausea/vomiting, or problems with urination or bowel movements unless otherwise stated above. Pertinent History Reviewed:  Reviewed past medical,surgical, social, obstetrical and family history.  Reviewed problem list, medications and allergies. Physical Assessment:   Vitals:   11/17/17 1035  BP: 115/66  Pulse: 88  Weight: 187 lb (84.8 kg)  Body mass index is 32.1 kg/m.           Physical Examination:   General appearance: alert, well appearing, and in no distress  Mental status: alert, oriented to person, place, and time  Skin: warm & dry   Extremities: Edema: Trace    Cardiovascular: normal heart rate noted  Respiratory: normal respiratory effort, no distress  Abdomen: gravid, soft, non-tender  Pelvic: Cervical exam performed FT th -2        Fetal Status: Fetal Heart Rate (bpm): 144 Fundal Height: 35 cm Movement: Present    Fetal Surveillance Testing today: BPP 8/8 with normal AFI, EFW 57%   No results found for this or any previous visit (from the past 24 hour(s)).  Assessment & Plan:  1) High-risk pregnancy G2P0100 at 6365w0d with an Estimated Date of Delivery: 12/15/17   2) Hx of IUFD due to severe pre eclampsia and abruption, stable    Meds: No  orders of the defined types were placed in this encounter.   Labs/procedures today: BPP 8/8 with normal AFI  Treatment Plan:  Twice weekly surveillance with NST, since BPP today can go 1 week, IOL 39 weeks  Reviewed: Term labor symptoms and general obstetric precautions including but not limited to vaginal bleeding, contractions, leaking of fluid and fetal movement were reviewed in detail with the patient.  All questions were answered.  Follow-up: Return in about 1 week (around 11/24/2017) for NST, HROB(cancel Wednesday's appointment).  Orders Placed This Encounter  Procedures  . GC/Chlamydia Probe Amp  . Culture, beta strep (group b only)  . POCT urinalysis dipstick   Lazaro ArmsLuther H Ronita Hargreaves  11/17/2017 11:04 AM

## 2017-11-18 LAB — GC/CHLAMYDIA PROBE AMP
Chlamydia trachomatis, NAA: NEGATIVE
NEISSERIA GONORRHOEAE BY PCR: NEGATIVE

## 2017-11-19 ENCOUNTER — Other Ambulatory Visit: Payer: 59 | Admitting: Obstetrics & Gynecology

## 2017-11-19 ENCOUNTER — Other Ambulatory Visit: Payer: 59

## 2017-11-20 ENCOUNTER — Inpatient Hospital Stay (HOSPITAL_COMMUNITY): Payer: 59 | Admitting: Anesthesiology

## 2017-11-20 ENCOUNTER — Other Ambulatory Visit: Payer: Self-pay

## 2017-11-20 ENCOUNTER — Inpatient Hospital Stay (HOSPITAL_COMMUNITY)
Admission: AD | Admit: 2017-11-20 | Discharge: 2017-11-22 | DRG: 805 | Disposition: A | Discharge status: Home / Self Care | Payer: 59 | Attending: Obstetrics & Gynecology | Admitting: Obstetrics & Gynecology

## 2017-11-20 ENCOUNTER — Encounter (HOSPITAL_COMMUNITY): Payer: Self-pay | Admitting: *Deleted

## 2017-11-20 DIAGNOSIS — Z3A36 36 weeks gestation of pregnancy: Secondary | ICD-10-CM

## 2017-11-20 DIAGNOSIS — O99324 Drug use complicating childbirth: Secondary | ICD-10-CM | POA: Diagnosis present

## 2017-11-20 DIAGNOSIS — Z3490 Encounter for supervision of normal pregnancy, unspecified, unspecified trimester: Secondary | ICD-10-CM

## 2017-11-20 DIAGNOSIS — Z7982 Long term (current) use of aspirin: Secondary | ICD-10-CM

## 2017-11-20 DIAGNOSIS — F1290 Cannabis use, unspecified, uncomplicated: Secondary | ICD-10-CM | POA: Diagnosis present

## 2017-11-20 DIAGNOSIS — Z87891 Personal history of nicotine dependence: Secondary | ICD-10-CM | POA: Diagnosis not present

## 2017-11-20 LAB — COMPREHENSIVE METABOLIC PANEL
ALK PHOS: 138 U/L — AB (ref 38–126)
ALT: 11 U/L (ref 0–44)
AST: 17 U/L (ref 15–41)
Albumin: 3.2 g/dL — ABNORMAL LOW (ref 3.5–5.0)
Anion gap: 11 (ref 5–15)
BILIRUBIN TOTAL: 0.5 mg/dL (ref 0.3–1.2)
BUN: 8 mg/dL (ref 6–20)
CALCIUM: 9.3 mg/dL (ref 8.9–10.3)
CO2: 20 mmol/L — ABNORMAL LOW (ref 22–32)
CREATININE: 0.65 mg/dL (ref 0.44–1.00)
Chloride: 104 mmol/L (ref 98–111)
Glucose, Bld: 85 mg/dL (ref 70–99)
Potassium: 3.7 mmol/L (ref 3.5–5.1)
SODIUM: 135 mmol/L (ref 135–145)
TOTAL PROTEIN: 6.9 g/dL (ref 6.5–8.1)

## 2017-11-20 LAB — TYPE AND SCREEN
ABO/RH(D): O POS
Antibody Screen: NEGATIVE

## 2017-11-20 LAB — CBC
HCT: 27.5 % — ABNORMAL LOW (ref 36.0–46.0)
Hemoglobin: 8.7 g/dL — ABNORMAL LOW (ref 12.0–15.0)
MCH: 24 pg — AB (ref 26.0–34.0)
MCHC: 31.6 g/dL (ref 30.0–36.0)
MCV: 76 fL — ABNORMAL LOW (ref 78.0–100.0)
PLATELETS: 316 10*3/uL (ref 150–400)
RBC: 3.62 MIL/uL — AB (ref 3.87–5.11)
RDW: 16.1 % — AB (ref 11.5–15.5)
WBC: 8.3 10*3/uL (ref 4.0–10.5)

## 2017-11-20 LAB — PROTEIN / CREATININE RATIO, URINE
Creatinine, Urine: 173 mg/dL
PROTEIN CREATININE RATIO: 0.09 mg/mg{creat} (ref 0.00–0.15)
Total Protein, Urine: 15 mg/dL

## 2017-11-20 MED ORDER — LACTATED RINGERS IV SOLN
INTRAVENOUS | Status: DC
  Administered 2017-11-20: 02:00:00 via INTRAVENOUS

## 2017-11-20 MED ORDER — ONDANSETRON HCL 4 MG PO TABS: 4.0000 mg | ORAL_TABLET | ORAL | Status: DC | PRN

## 2017-11-20 MED ORDER — PHENYLEPHRINE 40 MCG/ML (10ML) SYRINGE FOR IV PUSH (FOR BLOOD PRESSURE SUPPORT)
80.0000 ug | PREFILLED_SYRINGE | INTRAVENOUS | Status: DC | PRN
  Filled 2017-11-20: qty 5

## 2017-11-20 MED ORDER — FENTANYL CITRATE (PF) 100 MCG/2ML IJ SOLN
100.0000 ug | INTRAMUSCULAR | Status: DC | PRN
  Administered 2017-11-20: 100 ug via INTRAVENOUS
  Filled 2017-11-20: qty 2

## 2017-11-20 MED ORDER — OXYTOCIN BOLUS FROM INFUSION
500.0000 mL | Freq: Once | INTRAVENOUS | Status: AC
  Administered 2017-11-20: 500 mL via INTRAVENOUS

## 2017-11-20 MED ORDER — ACETAMINOPHEN 325 MG PO TABS: 650.0000 mg | ORAL_TABLET | ORAL | Status: DC | PRN

## 2017-11-20 MED ORDER — FENTANYL 2.5 MCG/ML BUPIVACAINE 1/10 % EPIDURAL INFUSION (WH - ANES)
14.0000 mL/h | INTRAMUSCULAR | Status: DC | PRN
  Administered 2017-11-20: 14 mL/h via EPIDURAL

## 2017-11-20 MED ORDER — LIDOCAINE HCL (PF) 1 % IJ SOLN
30.0000 mL | INTRAMUSCULAR | Status: DC | PRN
  Filled 2017-11-20: qty 30

## 2017-11-20 MED ORDER — BETAMETHASONE SOD PHOS & ACET 6 (3-3) MG/ML IJ SUSP
12.0000 mg | Freq: Once | INTRAMUSCULAR | Status: AC
  Administered 2017-11-20: 12 mg via INTRAMUSCULAR
  Filled 2017-11-20: qty 2

## 2017-11-20 MED ORDER — DIPHENHYDRAMINE HCL 50 MG/ML IJ SOLN: 12.5000 mg | INTRAMUSCULAR | Status: DC | PRN

## 2017-11-20 MED ORDER — IBUPROFEN 600 MG PO TABS
600.0000 mg | ORAL_TABLET | Freq: Four times a day (QID) | ORAL | Status: DC
  Administered 2017-11-20 – 2017-11-22 (×9): 600 mg via ORAL
  Filled 2017-11-20 (×9): qty 1

## 2017-11-20 MED ORDER — LACTATED RINGERS IV SOLN
500.0000 mL | INTRAVENOUS | Status: DC | PRN
  Administered 2017-11-20: 500 mL via INTRAVENOUS

## 2017-11-20 MED ORDER — WITCH HAZEL-GLYCERIN EX PADS
1.0000 "application " | MEDICATED_PAD | CUTANEOUS | Status: DC | PRN
  Administered 2017-11-22: 1 via TOPICAL

## 2017-11-20 MED ORDER — EPHEDRINE 5 MG/ML INJ
10.0000 mg | INTRAVENOUS | Status: DC | PRN
  Filled 2017-11-20: qty 2

## 2017-11-20 MED ORDER — COCONUT OIL OIL: 1.0000 "application " | TOPICAL_OIL | Status: DC | PRN

## 2017-11-20 MED ORDER — PRENATAL MULTIVITAMIN CH
1.0000 | ORAL_TABLET | Freq: Every day | ORAL | Status: DC
  Administered 2017-11-20 – 2017-11-22 (×3): 1 via ORAL
  Filled 2017-11-20 (×3): qty 1

## 2017-11-20 MED ORDER — SODIUM CHLORIDE 0.9 % IV SOLN
5.0000 10*6.[IU] | Freq: Once | INTRAVENOUS | Status: AC
  Administered 2017-11-20: 5 10*6.[IU] via INTRAVENOUS
  Filled 2017-11-20: qty 5

## 2017-11-20 MED ORDER — OXYTOCIN 40 UNITS IN LACTATED RINGERS INFUSION - SIMPLE MED
2.5000 [IU]/h | INTRAVENOUS | Status: DC
  Filled 2017-11-20: qty 1000

## 2017-11-20 MED ORDER — FENTANYL 2.5 MCG/ML BUPIVACAINE 1/10 % EPIDURAL INFUSION (WH - ANES)
INTRAMUSCULAR | Status: AC
  Filled 2017-11-20: qty 100

## 2017-11-20 MED ORDER — TETANUS-DIPHTH-ACELL PERTUSSIS 5-2.5-18.5 LF-MCG/0.5 IM SUSP: 0.5000 mL | Freq: Once | INTRAMUSCULAR | Status: DC

## 2017-11-20 MED ORDER — ONDANSETRON HCL 4 MG/2ML IJ SOLN: 4.0000 mg | INTRAMUSCULAR | Status: DC | PRN

## 2017-11-20 MED ORDER — EPHEDRINE 5 MG/ML INJ: 10.0000 mg | INTRAVENOUS | Status: DC | PRN

## 2017-11-20 MED ORDER — SENNOSIDES-DOCUSATE SODIUM 8.6-50 MG PO TABS
2.0000 | ORAL_TABLET | ORAL | Status: DC
  Administered 2017-11-20: 2 via ORAL
  Filled 2017-11-20 (×2): qty 2

## 2017-11-20 MED ORDER — SOD CITRATE-CITRIC ACID 500-334 MG/5ML PO SOLN: 30.0000 mL | ORAL | Status: DC | PRN

## 2017-11-20 MED ORDER — BENZOCAINE-MENTHOL 20-0.5 % EX AERO
1.0000 "application " | INHALATION_SPRAY | CUTANEOUS | Status: DC | PRN
  Filled 2017-11-20 (×2): qty 56

## 2017-11-20 MED ORDER — DIPHENHYDRAMINE HCL 25 MG PO CAPS: 25.0000 mg | ORAL_CAPSULE | Freq: Four times a day (QID) | ORAL | Status: DC | PRN

## 2017-11-20 MED ORDER — PHENYLEPHRINE 40 MCG/ML (10ML) SYRINGE FOR IV PUSH (FOR BLOOD PRESSURE SUPPORT): 80.0000 ug | PREFILLED_SYRINGE | INTRAVENOUS | Status: DC | PRN

## 2017-11-20 MED ORDER — LIDOCAINE HCL (PF) 1 % IJ SOLN
INTRAMUSCULAR | Status: DC | PRN
  Administered 2017-11-20: 5 mL via EPIDURAL

## 2017-11-20 MED ORDER — DIBUCAINE 1 % RE OINT: 1.0000 "application " | TOPICAL_OINTMENT | RECTAL | Status: DC | PRN

## 2017-11-20 MED ORDER — ZOLPIDEM TARTRATE 5 MG PO TABS: 5.0000 mg | ORAL_TABLET | Freq: Every evening | ORAL | Status: DC | PRN

## 2017-11-20 MED ORDER — SIMETHICONE 80 MG PO CHEW: 80.0000 mg | CHEWABLE_TABLET | ORAL | Status: DC | PRN

## 2017-11-20 MED ORDER — PENICILLIN G POT IN DEXTROSE 60000 UNIT/ML IV SOLN
3.0000 10*6.[IU] | INTRAVENOUS | Status: DC
  Filled 2017-11-20 (×3): qty 50

## 2017-11-20 MED ORDER — ONDANSETRON HCL 4 MG/2ML IJ SOLN: 4.0000 mg | Freq: Four times a day (QID) | INTRAMUSCULAR | Status: DC | PRN

## 2017-11-20 MED ORDER — PHENYLEPHRINE 40 MCG/ML (10ML) SYRINGE FOR IV PUSH (FOR BLOOD PRESSURE SUPPORT)
PREFILLED_SYRINGE | INTRAVENOUS | Status: AC
  Filled 2017-11-20: qty 10

## 2017-11-20 MED ORDER — LACTATED RINGERS IV SOLN: 500.0000 mL | Freq: Once | INTRAVENOUS | Status: DC

## 2017-11-20 NOTE — H&P (Addendum)
OBSTETRIC ADMISSION HISTORY AND PHYSICAL  Jacqueline Barrera is a 22 y.o. female G2P0100 with IUP at 4788w3d by 8wk US presenting for Preterm labor. She reports +FMs, No LOF, no VB, no blurry vision, headaches or peripheral edema, and RUQ pain.  She plans on breast feeding. She request depo for birth control. [facility]   Dating: By early US --->  Estimated Date of Delivery: 12/15/17  Sono:   @[redacted]w[redacted]d , CWD, normal anatomy, cephalic presentation, 2877g, 96%57% EFW. AFI normal  Prenatal History/Complications: H/o IUFD, severe preE and placental abruption Marijuana use  Past Medical History: Past Medical History:  Diagnosis Date  . Anxiety   . Scoliosis     Past Surgical History: Past Surgical History:  Procedure Laterality Date  . ADENOIDECTOMY    . TONSILLECTOMY AND ADENOIDECTOMY    . WISDOM TOOTH EXTRACTION      Obstetrical History: OB History    Gravida  2   Para  1   Term      Preterm  1   AB      Living  0     SAB      TAB      Ectopic      Multiple  0   Live Births              Social History: Social History   Socioeconomic History  . Marital status: Single    Spouse name: Not on file  . Number of children: Not on file  . Years of education: Not on file  . Highest education level: Not on file  Occupational History  . Not on file  Social Needs  . Financial resource strain: Not on file  . Food insecurity:    Worry: Not on file    Inability: Not on file  . Transportation needs:    Medical: Not on file    Non-medical: Not on file  Tobacco Use  . Smoking status: Former Smoker    Packs/day: 0.10    Years: 3.00    Pack years: 0.30    Types: Cigars    Last attempt to quit: 02/03/2014    Years since quitting: 3.7  . Smokeless tobacco: Never Used  Substance and Sexual Activity  . Alcohol use: No    Alcohol/week: 21.0 oz    Types: 35 Shots of liquor per week    Comment: before pregnancy  . Drug use: Not  Currently    Frequency: 7.0 times per week    Types: Marijuana    Comment: pt smokes to eat  . Sexual activity: Yes    Partners: Male    Birth control/protection: None  Lifestyle  . Physical activity:    Days per week: Not on file    Minutes per session: Not on file  . Stress: Not on file  Relationships  . Social connections:    Talks on phone: Not on file    Gets together: Not on file    Attends religious service: Not on file    Active member of club or organization: Not on file    Attends meetings of clubs or organizations: Not on file    Relationship status: Not on file  Other Topics Concern  . Not on file  Social History Narrative  . Not on file    Family History: Family History  Problem Relation Age of Onset  . Hyperlipidemia Maternal Aunt   . Hypertension Maternal Grandmother  Allergies: Allergies  Allergen Reactions  . Other Nausea And Vomiting    jalapenos    Medications Prior to Admission  Medication Sig Dispense Refill Last Dose  . aspirin (ASPIRIN CHILDRENS) 81 MG chewable tablet Chew 2 tablets (162 mg total) by mouth daily. Take 1 daily starting at 12 weeks 30 tablet 11 11/19/2017 at Unknown time  . Prenatal Vit-Fe Fumarate-FA (MULTIVITAMIN-PRENATAL) 27-0.8 MG TABS tablet Take 2 tablets by mouth daily at 12 noon.    11/19/2017 at Unknown time  . promethazine (PHENERGAN) 25 MG tablet Take 1 tablet (25 mg total) by mouth every 6 (six) hours as needed for nausea or vomiting. 30 tablet 1 Past Month at Unknown time   Review of Systems   All systems reviewed and negative except as stated in HPI  Blood pressure 127/74, pulse 95, temperature 98.8 F (37.1 C), temperature source Oral, resp. rate 18, height 5\' 5"  (1.651 m), weight 187 lb (84.8 kg), last menstrual period 03/16/2017, SpO2 100 %. General appearance: alert, cooperative and no distress Lungs: clear to auscultation bilaterally Heart: regular rate and rhythm Abdomen: soft, non-tender; bowel sounds  normal Extremities: Homans sign is negative, no sign of DVT Presentation: cephalic Fetal monitoringBaseline: 135 bpm, Variability: Good {> 6 bpm), Accelerations: Reactive and Decelerations: Absent Uterine activity every 2-3 min Dilation: 5 Effacement (%): 90 Station: 0 Exam by:: [name]   Prenatal labs: ABO, Rh: O/Positive/-- (01/22 4098) Antibody: Negative (04/23 0909) Rubella: 2.83 (01/22 0954) RPR: Non Reactive (04/23 0909)  HBsAg: Negative (01/22 0954)  HIV: Non Reactive (04/23 0909)  GBS:  unknown 2 hr Glucola 83, 110, 131 Genetic screening  Integrated screen negative Anatomy US @ [redacted]w[redacted]d, normal anatomy, female fetus  Prenatal Transfer Tool  Maternal Diabetes: No Genetic Screening: Normal Maternal Ultrasounds/Referrals: Normal Fetal Ultrasounds or other Referrals:  None Maternal Substance Abuse:  Yes:  Type: Marijuana Significant Maternal Medications:  None Significant Maternal Lab Results: Lab values include: Other: GBS unknown- culture pending  No results found for this or any previous visit (from the past 24 hour(s)).  Patient Active Problem List   Diagnosis Date Noted  . Supervision of high risk pregnancy, antepartum 06/10/2017  . History of IUFD 06/10/2017  . Hx of preeclampsia, prior pregnancy, currently pregnant 06/10/2017  . Marijuana use 08/28/2016    Assessment/Plan:  Jacqueline Barrera is a 22 y.o. G2P0100 at [redacted]w[redacted]d here for preterm onset of labor.  #Labor: Expectant management. Anticipate SVD. #Pain: Epidural on patient request #FWB: Cat I #ID:  GBS unkown - PCN. Obtaining GBS culture #MOF: breast #MOC: depo #Circ:  Yes (in)  [name], DO  11/20/2017, 1:43 AM  CNM attestation:  I have seen and examined this patient; I agree with above documentation in the resident's note.   Jacqueline Barrera is a 22 y.o. G2P0100 here for PTL  PE: BP 134/77   Pulse 97   Temp 98.1 F (36.7 C) (Oral)   Resp 16   Ht 5\' 5"  (1.651 m)   Wt 84.8 kg (187  lb)   LMP 03/16/2017 (Exact Date)   SpO2 100%   BMI 31.12 kg/m   Resp: normal effort, no distress Abd: gravid  ROS, labs, PMH reviewed  Plan: [facility] for GBS unknown BMZ Expectant management  [name] 11/20/2017, 6:38 AM

## 2017-11-20 NOTE — Anesthesia Preprocedure Evaluation (Signed)
Anesthesia Evaluation  Patient identified by MRN, date of birth, ID band Patient awake    Reviewed: Allergy & Precautions  Airway Mallampati: II       Dental no notable dental hx.    Pulmonary former smoker,    Pulmonary exam normal        Cardiovascular Normal cardiovascular exam     Neuro/Psych    GI/Hepatic negative GI ROS, Neg liver ROS,   Endo/Other    Renal/GU      Musculoskeletal   Abdominal   Peds  Hematology   Anesthesia Other Findings   Reproductive/Obstetrics (+) Pregnancy                             Anesthesia Physical Anesthesia Plan  ASA: II  Anesthesia Plan: Epidural   Post-op Pain Management:    Induction:   PONV Risk Score and Plan:   Airway Management Planned: Natural Airway  Additional Equipment:   Intra-op Plan:   Post-operative Plan:   Informed Consent: I have reviewed the patients History and Physical, chart, labs and discussed the procedure including the risks, benefits and alternatives for the proposed anesthesia with the patient or authorized representative who has indicated his/her understanding and acceptance.     Plan Discussed with:   Anesthesia Plan Comments: (Lab Results      Component                Value               Date                      WBC                      8.3                 11/20/2017                HGB                      8.7 (L)             11/20/2017                HCT                      27.5 (L)            11/20/2017                MCV                      76.0 (L)            11/20/2017                PLT                      316                 11/20/2017           )        Anesthesia Quick Evaluation

## 2017-11-20 NOTE — Progress Notes (Signed)
Mom requests DEBP, d/t sensitivity during BF.  I set up the pump, but after just a few minutes pt c/o tenderness to nipple tissue.  I got her bigger flanges to use the next time.  Also gave her nipple sheilds and comfort gells and hand pump.  She has a lot of family support and had a successful independent BF experience at 1400 today.

## 2017-11-20 NOTE — Progress Notes (Signed)
Parent request formula to supplement breast feeding due to low breast milk. Parents have been informed of small tummy size of newborn, taught hand expression and understand the possible consequences of formula to the health of the infant. The possible consequences shared with patient include 1) Loss of confidence in breastfeeding 2) Engorgement 3) Allergic sensitization of baby(asthma/allergies) and 4) decreased milk supply for mother.After discussion of the above the mother decided to supplement. The tool used to give formula supplement will be (curved tip syringe,feeding tube with gloved finger,cup,spoon,SNS)bottle.Mother counseled to avoid artificial nipples because this practice may lead to latch difficulties,inadequate milk transfer and nipple soreness.

## 2017-11-20 NOTE — Anesthesia Procedure Notes (Signed)
Epidural Patient location during procedure: OB Start time: 11/20/2017 4:12 AM End time: 11/20/2017 4:18 AM  Staffing Anesthesiologist: Shelton SilvasHollis, Daril Warga D, MD Performed: anesthesiologist   Preanesthetic Checklist Completed: patient identified, site marked, surgical consent, pre-op evaluation, timeout performed, IV checked, risks and benefits discussed and monitors and equipment checked  Epidural Patient position: sitting Prep: ChloraPrep Patient monitoring: heart rate, continuous pulse ox and blood pressure Approach: midline Location: L3-L4 Injection technique: LOR saline  Needle:  Needle type: Tuohy  Needle gauge: 17 G Needle length: 9 cm Catheter type: closed end flexible Catheter size: 20 Guage Test dose: negative and 1.5% lidocaine  Assessment Events: blood not aspirated, injection not painful, no injection resistance and no paresthesia  Additional Notes LOR @ 5  Patient identified. Risks/Benefits/Options discussed with patient including but not limited to bleeding, infection, nerve damage, paralysis, failed block, incomplete pain control, headache, blood pressure changes, nausea, vomiting, reactions to medications, itching and postpartum back pain. Confirmed with bedside nurse the patient's most recent platelet count. Confirmed with patient that they are not currently taking any anticoagulation, have any bleeding history or any family history of bleeding disorders. Patient expressed understanding and wished to proceed. All questions were answered. Sterile technique was used throughout the entire procedure. Please see nursing notes for vital signs. Test dose was given through epidural catheter and negative prior to continuing to dose epidural or start infusion. Warning signs of high block given to the patient including shortness of breath, tingling/numbness in hands, complete motor block, or any concerning symptoms with instructions to call for help. Patient was given instructions on  fall risk and not to get out of bed. All questions and concerns addressed with instructions to call with any issues or inadequate analgesia.    Reason for block:procedure for pain

## 2017-11-20 NOTE — Lactation Note (Signed)
This note was copied from a baby's chart. Lactation Consultation Note  Patient Name: Jacqueline Denny PeonKrista Waterson RUEAV'WToday's Date: 11/20/2017 Reason for consult: Initial assessment;Late-preterm 34-36.6wks;1st time breastfeeding  Visited with P2 Mom of LPTI born at 3066w3d.  Mom had a previous loss at 32 weeks due to pre-eclampsia.   Baby 2 hrs old.  [facility] stated that [facility], but "didn't like it".  Mom seems uncertain about breastfeeding.  Offered to assist with positioning and latching.  Baby STS with FOB currently while Mom on the phone. Assisted to place baby STS on Mom's chest.  Mom encouraged to keep baby there as much as possible.  Demonstrated breast massage and hand expression, colostrum easily expressed.  (Mom had previous nipple piercing, colostrum flowing from side of nipple) Baby positioned in laid back hold, and baby opened his mouth and latched onto breast, but sucked once.  Mom again stated she feels baby doesn't like it.  Talked about normal LPTI newborn behavior.  Talked about needing to double pump, [facility].  Encouraged Mom to not make a decision about breastfeeding so early.  Talked about benefits of breastmilk to her baby, and benefits to mothers that breastfeed. Hand expressed 3 ml of colostrum and showed Mom and FOB how to spoon feed her colostrum to baby.  Encouraged her to do this every couple hours while baby sleepy.   To set up DEBP in next couple hours.  Aware of possibility of overwhelming Mom with too much to do, so taking it one step at a time. Lactation brochure given to Mom.  Mom aware of IP and OP lactation services available. Encouraged STS, and feeding baby often on cue, or 8-12 times per 24 hrs. Mom knows to call prn when baby starts cueing.  Maternal Data Formula Feeding for Exclusion: No Has patient been taught Hand Expression?: Yes Does the patient have breastfeeding experience prior to this delivery?: No  Feeding Feeding Type:  Breast Fed  LATCH Score Latch: Repeated attempts needed to sustain latch, nipple held in mouth throughout feeding, stimulation needed to elicit sucking reflex.  Audible Swallowing: None  Type of Nipple: Everted at rest and after stimulation  Comfort (Breast/Nipple): Soft / non-tender  Hold (Positioning): Full assist, staff holds infant at breast  LATCH Score: 5  Interventions Interventions: Breast feeding basics reviewed;Assisted with latch;Skin to skin;Breast massage;Hand express;Breast compression;Adjust position;Support pillows;Position options;Expressed milk;DEBP  Lactation Tools Discussed/Used WIC Program: Yes   Consult Status Consult Status: Follow-up Date: 11/21/17 Follow-up type: In-patient    Judee ClaraSmith, Jacqueline Barrera E 11/20/2017, 8:44 AM

## 2017-11-20 NOTE — MAU Note (Signed)
Pt reports contractions every 2-5 mins. Pt denies LOF or vaginal bleeding. Reports good fetal movement although less tonight. Cervix closed on Monday

## 2017-11-20 NOTE — Consult Note (Signed)
Delivery Note:  Requested to attend this vaginal delivery for vacuum-assist.   Delivery team dismissed by Dr. Despina HiddenEure and L&D staff the minute they arrived in Room 164 since infant was just born and stable.   Jacqueline MamMary Ann T Jenniah Bhavsar, MD (Attending Neonatologist)

## 2017-11-20 NOTE — Anesthesia Postprocedure Evaluation (Signed)
Anesthesia Post Note  Patient: Jacqueline PeonKrista Barrera  Procedure(s) Performed: AN AD HOC LABOR EPIDURAL     Patient location during evaluation: Mother Baby Anesthesia Type: Epidural Level of consciousness: awake and alert Pain management: pain level controlled Vital Signs Assessment: post-procedure vital signs reviewed and stable Respiratory status: spontaneous breathing, nonlabored ventilation and respiratory function stable Cardiovascular status: stable Postop Assessment: no headache, no backache, epidural receding, able to ambulate, adequate PO intake, no apparent nausea or vomiting and patient able to bend at knees Anesthetic complications: no    Last Vitals:  Vitals:   11/20/17 0845 11/20/17 1245  BP: (!) 141/73 139/79  Pulse:    Resp: 16 18  Temp: (!) 36.4 C 36.9 C  SpO2: 100% 100%    Last Pain:  Vitals:   11/20/17 1245  TempSrc: Oral  PainSc:    Pain Goal:                 [name],[name]

## 2017-11-21 LAB — CULTURE, BETA STREP (GROUP B ONLY): Strep Gp B Culture: NEGATIVE

## 2017-11-21 NOTE — Clinical Social Work Maternal (Signed)
CLINICAL SOCIAL WORK MATERNAL/CHILD NOTE  Patient Details  Name: Jacqueline Barrera MRN: [id number] Date of Birth: 09/22/1995  Date:  11/21/2017  Clinical Social Worker Initiating Note:  Jacqueline Barrera Date/Time: Initiated:  11/21/17/1107     Child's Name:  Jacqueline Barrera   Biological Parents:  Mother, Father(FOB is Jacqueline Barrera 08/10/1988)   Need for Interpreter:  None   Reason for Referral:  Current Substance Use/Substance Use During Pregnancy , Behavioral Health Concerns   Address:  [address] [place] [address]    Phone number:  [phone] (home)     Additional phone number:   Household Members/Support Persons (HM/SP):   (Per MOB, MOB resides with her mother.)   HM/SP Name Relationship DOB or Age  HM/SP -1        HM/SP -2        HM/SP -3        HM/SP -4        HM/SP -5        HM/SP -6        HM/SP -7        HM/SP -8          Natural Supports (not living in the home):      Professional Supports: None   Employment: Full-time   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 [employer]    Education:  Some College   Homebound arranged:    Financial Resources:  Medicaid, Private Insurance   Other Resources:  WIC   Cultural/Religious Considerations Which May Impact Care:  Per MOB's face Sheet, MOB is Holiness  Strengths:  Ability to meet basic needs , Home prepared for child , Pediatrician chosen   Psychotropic Medications:         Pediatrician:    [place]  Pediatrician List:   [place]    [place]    [place]    [facility] [facility]  [place]    [place]      Pediatrician Fax Number:    Risk Factors/Current Problems:  Substance Use    Cognitive State:  Able to Concentrate , Alert , Linear Thinking    Mood/Affect:  Agitated , Flat , Irritable    CSW Assessment: CSW met with MOB in room 113 to complete an assessment for SA hx and MH hx.  When CSW arrived, MOB was resting in  bed with infant.  MOB was awake and during the assessment it appeared that MOB was distracted by her cell phone.  MOB also appeared flat and irritated as evidence by a lack of eye contact and minium engagement with CSW.   CSW asked about MOB's MH hx and MOB denied a hx of anxiety/ depression.  MOB communicated, "I don't know where you got that from but, no I have never had any anxiety or depression."  CSW attempted to ask about MOB's IUFD and with a quick response, MOB communicated that she did not want to talk about it. CSW offered MOB information regarding PMAD and MOB respectfully declined.   CSW asked about MOB's SA hx and MOB was appeared hesitant to share information.  MOB stated, "I don't use any drugs."  CSW informed MOB of a positive toxicology screen on 06/10/17, and MOB stated, "I have not used anything since I found out I was pregnant."  CSW praised MOB for her sobriety and encouraged her to continue.  CSW also made MOB aware hospitals SA policy. MOB was understanding and did not appear   to be bother.  MOB is aware the CS will monitor infant's CDS and will make are report to [place] CPS if warranted.   MOB reported having all essential times needed for infant.   CSW Plan/Description:  No Further Intervention Required/No Barriers to Discharge, Sudden Infant Death Syndrome (SIDS) Education, Other Information/Referral to Community Resources, Hospital Drug Screen Policy Information, CSW Will Continue to Monitor Umbilical Cord Tissue Drug Screen Results and Make Report if Warranted   Jacqueline Barrera, MSW, LCSW Clinical Social Work [phone]   Jacqueline Mclane D BOYD-GILYARD, LCSW 11/21/2017, 2:12 PM 

## 2017-11-21 NOTE — Lactation Note (Signed)
This note was copied from a baby's chart. Lactation Consultation Note  Patient Name: Jacqueline Denny PeonKrista Nicoll ZOXWR'UToday's Date: 11/21/2017 Reason for consult: Follow-up assessment;Late-preterm 34-36.6wks;Infant < 6lbs  Mom latched infant on and off breast using cross-cradle position for 5 minutes. Mom has large diameter nipples fitted with 30 and 36 flanges mother states both flanges  feel comfortable lubricated with coconut oil. Mom using DEBP and expressed 15 ml of EBM. Infant  received 10 ml of EBM using a curve tip syringe. Recommend mom post pump 4 to 6 times per day 10 to 20 minutes within 24 hours with DEBP on initiation setting. Give baby back volume pumped at the next feeding. Mom encouraged to feed baby 8-12 times/24 hours and with feeding cues at least q 3 hours.  Reviewed cleaning and milk storage guidelines.  Caring for your late preterm baby discussed. Maternal Data Has patient been taught Hand Expression?: Yes Does the patient have breastfeeding experience prior to this delivery?: No  Feeding Feeding Type: Breast Fed Length of feed: 5 min(Baby on and off breast.)  LATCH Score Latch: Grasps breast easily, tongue down, lips flanged, rhythmical sucking.  Audible Swallowing: A few with stimulation  Type of Nipple: Everted at rest and after stimulation  Comfort (Breast/Nipple): Soft / non-tender  Hold (Positioning): Assistance needed to correctly position infant at breast and maintain latch.  LATCH Score: 8  Interventions    Lactation Tools Discussed/Used Tools: Coconut oil;Flanges;50F feeding tube / Syringe;Pump   Consult Status Consult Status: Follow-up Date: 11/22/17 Follow-up type: In-patient    Danelle EarthlyRobin Maitland Muhlbauer 11/21/2017, 3:57 PM

## 2017-11-21 NOTE — Progress Notes (Addendum)
POSTPARTUM PROGRESS NOTE  Post Partum Day 1 Subjective:  Jacqueline Barrera is a 22 y.o. [id number] 59w3ds/p SVD.  No acute events overnight.  Pt denies problems with ambulating, voiding or po intake.  She denies nausea or vomiting.  Pain is well controlled. Lochia Small. Breastfeeding although concerned she is not producing enough, started supplementing with formula. Intermittent higher BP 140s, although most recent normalized, denies PIH symptoms.  Objective: Blood pressure 124/75, pulse 87, temperature 98.5 F (36.9 C), temperature source Oral, resp. rate 16, height _0  (1.651 m), weight 187 lb (84.8 kg), last menstrual period 03/16/2017, SpO2 100 %, unknown if currently breastfeeding.  Physical Exam:  General: alert, cooperative and no distress Lochia:normal flow Chest: no respiratory distress Heart:regular rate, distal pulses intact Abdomen: soft, nontender,  Uterine Fundus: firm, appropriately tender DVT Evaluation: No calf swelling or tenderness Extremities: no edema  Recent Labs    11/20/17 0144  HGB 8.7*  HCT 27.5*    Assessment/Plan:  ASSESSMENT: Jacqueline Barrera a 22y.o. [id number]/p SVD, doing well. Breastfeeding with some formula supplementation. Continue to monitor BP with plans for medication management if persistent.  Circumcision prior to discharge. Plan for discharge tomorrow.   LOS: 1 day   AlRory PercyDO 11/21/2017, 6:00 AM   I have spoken with and examined this patient and agree with resident/PA-S/Med-S/SNM's note and plan of care. VSS, HRR&R, Resp unlabored, Legs neg.  FrNigel BertholdCNM 11/21/2017 8:11 AM

## 2017-11-21 NOTE — Lactation Note (Signed)
This note was copied from a baby's chart. Lactation Consultation Note  Patient Name: Jacqueline Barrera ZOXWR'UToday's Date: 11/21/2017   Supplement guidelines reviewed with Mom. Infant seems to swallow quickly with yellow slow-flow nipple. Green slow-flow nipple brought into room for parents to try.  Lurline HareRichey, [name] [facility] 11/21/2017, 11:56 PM

## 2017-11-22 LAB — CULTURE, BETA STREP (GROUP B ONLY)

## 2017-11-22 LAB — RPR: RPR: NONREACTIVE

## 2017-11-22 MED ORDER — IBUPROFEN 600 MG PO TABS: 600.0000 mg | ORAL_TABLET | Freq: Four times a day (QID) | ORAL | 0 refills | Status: DC

## 2017-11-22 NOTE — Discharge Summary (Addendum)
OB Discharge Summary    Patient Name: Jacqueline Barrera DOB: [birth year] MRN: [id number] Date of admission: 11/20/2017  Delivering MD: Duane LopeEURE, LUTHER H   Date of discharge: 11/22/2017  Admitting diagnosis: Preterm labor Intrauterine pregnancy: 5956w3d    Secondary diagnosis:  Active Problems:   Patient Active Problem List   Diagnosis Date Noted  . Pregnancy 11/20/2017  . Supervision of high risk pregnancy, antepartum 06/10/2017  . History of IUFD 06/10/2017  . Hx of preeclampsia, prior pregnancy, currently pregnant 06/10/2017  . Marijuana use 08/28/2016   Additional problems: H/o IUFD with placental abruption and severe preE     Discharge diagnosis: Preterm Pregnancy Delivered                                                                                               Post partum procedures: None  Complications: None  Hospital course:  Onset of Labor With Vaginal Delivery     22 y.o. yo G2P0201 at 5156w3d was admitted in Active Labor on 11/20/2017. Patient had an uncomplicated labor course as follows:  Membrane Rupture Time/Date: 4:05 AM ,11/20/2017   Intrapartum Procedures: Episiotomy: None [1]                                         Lacerations:  None [1]  Patient had a delivery of a Viable infant. 11/20/2017  Information for the patient's newborn:  Jacqueline Barrera, Boy Japneet [[id number]][[id number]]  Delivery Method: Vaginal, Spontaneous(Filed from Delivery Summary)   Patient had an uncomplicated postpartum course.  She is ambulating, tolerating a regular diet, passing flatus, and urinating well. Patient is discharged home in stable condition on 11/22/17.  Physical exam  Vitals:   11/21/17 2304 11/22/17 0500  BP: 119/78 130/81  Pulse: 89 74  Resp:  18  Temp: 98.6 F (37 C) 98.5 F (36.9 C)  SpO2:      General: alert, cooperative and no distress Lochia: appropriate Uterine Fundus: firm Incision: N/A DVT Evaluation: No evidence of DVT seen on physical exam.  Labs: No results found for this or any  previous visit (from the past 24 hour(s)).  Discharge instruction: per After Visit Summary and "Baby and Me Booklet".  After visit meds:  Allergies  Allergen Reactions  . Other Nausea And Vomiting    jalapenos    Allergies as of 11/22/2017      Reactions   Other Nausea And Vomiting   jalapenos      Medication List    STOP taking these medications   aspirin 81 MG chewable tablet Commonly known as:  ASPIRIN CHILDRENS   promethazine 25 MG tablet Commonly known as:  PHENERGAN     TAKE these medications   ibuprofen 600 MG tablet Commonly known as:  ADVIL,MOTRIN Take 1 tablet (600 mg total) by mouth every 6 (six) hours.   multivitamin-prenatal 27-0.8 MG Tabs tablet Take 2 tablets by mouth daily at 12 noon. Gummy Prenatal Vitamins      Diet: routine diet  Activity: Advance as tolerated. Pelvic rest  for 6 weeks.   Outpatient follow up: 1 week BP check, 4-6 weeks postpartum Future Appointments:  Future Appointments  Date Time Provider Department Center  12/26/2017  9:30 AM Cheral Marker, CNM [facility]   Follow up Appt: No follow-ups on file.  Postpartum contraception: Depo Provera  Newborn Data: APGAR (1 MIN): 8   APGAR (5 MINS): 9    Baby Feeding: Breast Disposition: pending pediatric eval. Home with mother vs. Rooming in  [place], DO 11/22/2017  Midwife attestation I have seen and examined this patient and agree with above documentation in the resident's note.   Jacqueline Barrera is a 22 y.o. [id number] s/p SVD.   Pain is well controlled.  Plan for birth control is Depo-Provera.  Method of Feeding: breast  PE:  Gen: well appearing Heart: reg rate Lungs: normal WOB Fundus firm Ext: soft, no pain, no edema  Recent Labs    11/20/17 0144  HGB 8.7*  HCT 27.5*     Assessment PPD #2 SVD  Plan: - discharge today - postpartum care discussed - f/u clinic in 6 weeks for postpartum visit   Jacqueline Barrera, CNM 8:53 AM

## 2017-11-22 NOTE — Lactation Note (Signed)
This note was copied from a baby's chart. Lactation Consultation Note:  Mother reports that she is continuing to pump every 2-3 hours. Mother last pumped and fed 22ml of ebm with a bottle. Mother reports that she plans to pump and bottle feed only.  [place]. Discussed WIC loaner pump. GMOB reports that she plans to phone her insurance company about a pump on Monday. Mother may take home a [facility].  [facility] [facility] [facility].  Mother is aware of all available LC services and community support. [facility].   Patient Name: Boy Denny PeonKrista Openshaw ZOXWR'UToday's Date: 11/22/2017 Reason for consult: Follow-up assessment   Maternal Data    Feeding Feeding Type: Breast Milk Nipple Type: Slow - flow  LATCH Score                   Interventions Interventions: DEBP  Lactation Tools Discussed/Used     Consult Status Consult Status: Follow-up Date: 11/22/17 Follow-up type: In-patient    Stevan BornKendrick, Shareena Nusz [facility] 11/22/2017, 3:29 PM

## 2017-11-22 NOTE — Discharge Instructions (Signed)
Vaginal Delivery, Care After °Refer to this sheet in the next few weeks. These instructions provide you with information about caring for yourself after vaginal delivery. Your health care provider may also give you more specific instructions. Your treatment has been planned according to current medical practices, but problems sometimes occur. Call your health care provider if you have any problems or questions. °What can I expect after the procedure? °After vaginal delivery, it is common to have: °· Some bleeding from your vagina. °· Soreness in your abdomen, your vagina, and the area of skin between your vaginal opening and your anus (perineum). °· Pelvic cramps. °· Fatigue. ° °Follow these instructions at home: °Medicines °· Take over-the-counter and prescription medicines only as told by your health care provider. °· If you were prescribed an antibiotic medicine, take it as told by your health care provider. Do not stop taking the antibiotic until it is finished. °Driving ° °· Do not drive or operate heavy machinery while taking prescription pain medicine. °· Do not drive for 24 hours if you received a sedative. °Lifestyle °· Do not drink alcohol. This is especially important if you are breastfeeding or taking medicine to relieve pain. °· Do not use tobacco products, including cigarettes, chewing tobacco, or e-cigarettes. If you need help quitting, ask your health care provider. °Eating and drinking °· Drink at least 8 eight-ounce glasses of water every day unless you are told not to by your health care provider. If you choose to breastfeed your baby, you may need to drink more water than this. °· Eat high-fiber foods every day. These foods may help prevent or relieve constipation. High-fiber foods include: °? Whole grain cereals and breads. °? Brown rice. °? Beans. °? Fresh fruits and vegetables. °Activity °· Return to your normal activities as told by your health care provider. Ask your health care provider  what activities are safe for you. °· Rest as much as possible. Try to rest or take a nap when your baby is sleeping. °· Do not lift anything that is heavier than your baby or 10 lb (4.5 kg) until your health care provider says that it is safe. °· Talk with your health care provider about when you can engage in sexual activity. This may depend on your: °? Risk of infection. °? Rate of healing. °? Comfort and desire to engage in sexual activity. °Vaginal Care °· If you have an episiotomy or a vaginal tear, check the area every day for signs of infection. Check for: °? More redness, swelling, or pain. °? More fluid or blood. °? Warmth. °? Pus or a bad smell. °· Do not use tampons or douches until your health care provider says this is safe. °· Watch for any blood clots that may pass from your vagina. These may look like clumps of dark red, brown, or black discharge. °General instructions °· Keep your perineum clean and dry as told by your health care provider. °· Wear loose, comfortable clothing. °· Wipe from front to back when you use the toilet. °· Ask your health care provider if you can shower or take a bath. If you had an episiotomy or a perineal tear during labor and delivery, your health care provider may tell you not to take baths for a certain length of time. °· Wear a bra that supports your breasts and fits you well. °· If possible, have someone help you with household activities and help care for your baby for at least a few days after   you leave the hospital. °· Keep all follow-up visits for you and your baby as told by your health care provider. This is important. °Contact a health care provider if: °· You have: °? Vaginal discharge that has a bad smell. °? Difficulty urinating. °? Pain when urinating. °? A sudden increase or decrease in the frequency of your bowel movements. °? More redness, swelling, or pain around your episiotomy or vaginal tear. °? More fluid or blood coming from your episiotomy or  vaginal tear. °? Pus or a bad smell coming from your episiotomy or vaginal tear. °? A fever. °? A rash. °? Little or no interest in activities you used to enjoy. °? Questions about caring for yourself or your baby. °· Your episiotomy or vaginal tear feels warm to the touch. °· Your episiotomy or vaginal tear is separating or does not appear to be healing. °· Your breasts are painful, hard, or turn red. °· You feel unusually sad or worried. °· You feel nauseous or you vomit. °· You pass large blood clots from your vagina. If you pass a blood clot from your vagina, save it to show to your health care provider. Do not flush blood clots down the toilet without having your health care provider look at them. °· You urinate more than usual. °· You are dizzy or light-headed. °· You have not breastfed at all and you have not had a menstrual period for 12 weeks after delivery. °· You have stopped breastfeeding and you have not had a menstrual period for 12 weeks after you stopped breastfeeding. °Get help right away if: °· You have: °? Pain that does not go away or does not get better with medicine. °? Chest pain. °? Difficulty breathing. °? Blurred vision or spots in your vision. °? Thoughts about hurting yourself or your baby. °· You develop pain in your abdomen or in one of your legs. °· You develop a severe headache. °· You faint. °· You bleed from your vagina so much that you fill two sanitary pads in one hour. °This information is not intended to replace advice given to you by your health care provider. Make sure you discuss any questions you have with your health care provider. °Document Released: 05/03/2000 Document Revised: 10/18/2015 Document Reviewed: 05/21/2015 °Elsevier Interactive Patient Education © 2018 [employer] ° °

## 2017-11-23 ENCOUNTER — Ambulatory Visit: Payer: Self-pay

## 2017-11-23 NOTE — Lactation Note (Addendum)
This note was copied from a baby's chart. Lactation Consultation Note  Patient Name: Boy Denny PeonKrista Emory NWGNF'AToday's Date: 11/23/2017 Reason for consult: Follow-up assessment   Mother wants to exclusively pump. 1542w3d < 6 lbs.  P1, Mother is worried she is pumping but not getting enough breastmilk.   Reviewed hand expression with very good flow of transitional breastmilk. Mother recently pumped 25 ml which was givien to baby with slow flow nipple.  Baby received formula supplementation also.  Reviewed volume guidelines and how infant's feed on demand and provided education on the volume she is pumping. Mom encouraged to feed baby 8-12 times/24 hours and with feeding cues at least q 4 hours.  Reviewed engorgement care and monitoring voids/stools. [facility]   Observed pumping with good flow- demonstrated hands on pumping.     Maternal Data    Feeding Feeding Type: Breast Milk Nipple Type: Slow - flow  LATCH Score                   Interventions Interventions: DEBP;Hand express  Lactation Tools Discussed/Used     Consult Status Consult Status: Follow-up Date: 11/24/17 Follow-up type: In-patient    Dahlia ByesBerkelhammer, Ruth [facility] 11/23/2017, 12:17 PM

## 2017-11-24 ENCOUNTER — Ambulatory Visit: Payer: Self-pay

## 2017-11-24 ENCOUNTER — Other Ambulatory Visit: Payer: 59 | Admitting: Women's Health

## 2017-11-24 NOTE — Lactation Note (Signed)
This note was copied from a baby's chart. Lactation Consultation Note  Patient Name: Boy Denny PeonKrista Towner LKGMW'NToday's Date: 11/24/2017 Reason for consult: Hyperbilirubinemia;Follow-up assessment   Baby 614 days old on phototherapy.   Mother recently pumped 80 ml with DEBP. Mother is bottle feeding breastmilk and supplemented w/ formula. She has been educated about pacifier use. Mom encouraged to feed baby 8-12 times/24 hours and with feeding cues.  Mother has appt w/ WIC for DEBP after discharge.    Maternal Data    Feeding    LATCH Score                   Interventions Interventions: DEBP  Lactation Tools Discussed/Used     Consult Status Consult Status: Follow-up Date: 11/25/17 Follow-up type: In-patient    Dahlia ByesBerkelhammer, Ruth [facility] 11/24/2017, 12:51 PM

## 2017-11-25 ENCOUNTER — Encounter: Payer: Self-pay | Admitting: *Deleted

## 2017-11-25 ENCOUNTER — Telehealth: Payer: Self-pay | Admitting: *Deleted

## 2017-11-25 ENCOUNTER — Ambulatory Visit: Payer: Self-pay

## 2017-11-25 MED ORDER — LIDOCAINE HCL 2 % EX GEL: 1.0000 "application " | CUTANEOUS | 0 refills | Status: DC | PRN

## 2017-11-25 NOTE — Lactation Note (Signed)
This note was copied from a baby's chart. Lactation Consultation Note  Patient Name: Boy Rey Fors VQQVZ'D Date: 11/25/2017 Reason for consult: Follow-up assessment;1st time breastfeeding;Late-preterm 34-36.6wks   Follow up with first time BF mom of 5 day old LPT infant. Infant with 16+ bottle feedings of EBM and formula, 14 voids, and 9 stools in the last 24 hours. Infant weight 6 pounds 5.2 ounces with 3 ounce weight gain in the last 24 hours. Mom reports she has been giving breast milk as well as formula through the night that she pumped late last night. She has good documentation on her feeding log and we updated feeding flowsheet per mom's documentation. She also gave formula at times during the night when she was out of breast milk.   Mom is planning to pump and bottle feed, she does not want to latch infant to the breast. Mom is not pumping regularly, she reports when she does pump she gets 5-6 ounces of milk. She has a bottle of EBM at the bedside that infant recently fed from. Reminded mom that EBM is good for 2 hours after infant Suckles off of the bottle and 1 hour for formula bottles, mom voiced understanding. Mom says she has not been able to pump as much since infant under phototherapy and needed to be held for comfort. Reviewed supply and demand and engorgement prevention with mom. Mom is not engorged at this time. Engorgement prevention/treatment reviewed with mom.   Mom began to pump when [facility] was in the room. Changed flange size on the right breast to # 27 and # 30 flange size on the left breast for better fit. Enc mom to pump 8-12 x in 24 hours to maintain and protect milk supply. Mom had rented a DEBP to take home, she requested to return it as she will get one from her [facility] office tomorrow morning, told mom I would recommend the DEBP vs manual but she still wanted to return it. Her money was returned to her. Mom was given manual pump and shown how to use, Assemble, disassemble and  clean it. She was also shown how to double pump using her DEBP pump kit. reminded mom to take all pump parts home with her.  Enc mom to massage breast with pumping and to make a hands free bra to use. Mom voiced understanding.   Reviewed I/O, signs of dehydration in the infant, signs infant is getting enough, and breast milk storage and expression.   Mom aware of OP services, BF Support groups and [place] Phone #. Mom to call with questions/concerns. Mom reports all questions/concerns have been answered at this time.    Maternal Data Has patient been taught Hand Expression?: Yes Does the patient have breastfeeding experience prior to this delivery?: No  Feeding Feeding Type: Bottle Fed - Breast Milk Nipple Type: Slow - flow  LATCH Score                   Interventions Interventions: Expressed milk;DEBP;Hand pump;Breast compression;Breast massage  Lactation Tools Discussed/Used WIC Program: Yes Pump Review: Setup, frequency, and cleaning;Milk Storage Initiated by:: reviewed and encouraged 8-12 x in 24 hours   Consult Status Consult Status: Complete Follow-up type: Call as needed    Donn Pierini 11/25/2017, 11:14 AM

## 2017-11-25 NOTE — Telephone Encounter (Signed)
Unable to leave VM

## 2017-11-25 NOTE — Telephone Encounter (Signed)
Patient called with complaints of severe hemorrhoids to the point it is very painful to sit.  She was recently discharged from the hospital from giving birth and [facility] and Bupivacaine in which she has been using for a few days with no relief.  She is asking for a prescription for something else.  Please advise.

## 2017-11-25 NOTE — Addendum Note (Signed)
Addended by: Shawna ClampBOOKER, Andreal Vultaggio R on: 11/25/2017 01:04 PM   Modules accepted: Orders

## 2017-11-26 ENCOUNTER — Encounter: Payer: Self-pay | Admitting: Women's Health

## 2017-11-26 ENCOUNTER — Telehealth: Payer: Self-pay

## 2017-11-26 ENCOUNTER — Telehealth: Payer: Self-pay | Admitting: *Deleted

## 2017-11-26 NOTE — Telephone Encounter (Signed)
Pt informed to start taking iron supplement 1 tablet two times daily. HGB 9.6.

## 2017-11-26 NOTE — Telephone Encounter (Signed)
Called patient regarding bleeding and clots.  States she has passed 2 golf ball sized clots since delivery and very tiny ones in between.  Bleeding does not seem heavy to her only changing pad about every 2 hours.  Advised patient to continue to monitor and if clots continue or bleeding increases, to give us a call and to make sure she was emptying her bladder frequently.  Verbalized understanding.

## 2017-12-25 ENCOUNTER — Encounter: Payer: Self-pay | Admitting: Women's Health

## 2017-12-25 ENCOUNTER — Ambulatory Visit (INDEPENDENT_AMBULATORY_CARE_PROVIDER_SITE_OTHER): Payer: 59 | Admitting: Women's Health

## 2017-12-25 ENCOUNTER — Encounter: Payer: Self-pay | Admitting: *Deleted

## 2017-12-25 ENCOUNTER — Ambulatory Visit: Payer: 59

## 2017-12-25 DIAGNOSIS — Z3202 Encounter for pregnancy test, result negative: Secondary | ICD-10-CM

## 2017-12-25 DIAGNOSIS — Z8751 Personal history of pre-term labor: Secondary | ICD-10-CM | POA: Insufficient documentation

## 2017-12-25 DIAGNOSIS — Z30013 Encounter for initial prescription of injectable contraceptive: Secondary | ICD-10-CM

## 2017-12-25 LAB — POCT URINE PREGNANCY: Preg Test, Ur: NEGATIVE

## 2017-12-25 MED ORDER — MEDROXYPROGESTERONE ACETATE 150 MG/ML IM SUSP: 150.0000 mg | INTRAMUSCULAR | 3 refills | Status: DC

## 2017-12-25 NOTE — Progress Notes (Signed)
POSTPARTUM VISIT Patient name: Jacqueline Barrera MRN [id number]  Date of birth: 05/31/1995 Chief Complaint:   postpartum visit (wants to start Depo)  History of Present Illness:   Jacqueline Barrera is a 22 y.o. G12P0201 African American female being seen today for a postpartum visit. She is 4 weeks postpartum following a spontaneous vaginal delivery at 36.3 gestational weeks after spontaneous labor. Anesthesia: epidural. I have fully reviewed the prenatal and intrapartum course. Pregnancy complicated by h/o pre-e, h/o IUFD. Postpartum course has been uncomplicated. Bleeding no bleeding. Bowel function is normal. Bladder function is normal.  Patient is not sexually active. Last sexual activity: prior to birth of baby.  Contraception method is wants depo.  [place] Postpartum Depression Screening: negative. Score 0.   Last pap 04/18/16.  Results were normal .  No LMP recorded.  Baby's course has been uncomplicated. Baby is feeding by breast first, now bottle.  Review of Systems:   Pertinent items are noted in HPI Denies Abnormal vaginal discharge w/ itching/odor/irritation, headaches, visual changes, shortness of breath, chest pain, abdominal pain, severe nausea/vomiting, or problems with urination or bowel movements. Pertinent History Reviewed:  Reviewed past medical,surgical, obstetrical and family history.  Reviewed problem list, medications and allergies. OB History  Gravida Para Term Preterm AB Living  2 2   2   1   SAB TAB Ectopic Multiple Live Births        0 1    # Outcome Date GA Lbr Len/2nd Weight Sex Delivery Anes PTL Lv  2 Preterm 11/20/17 [redacted]w[redacted]d 07:10 / 00:21 6 lb 4.4 oz (2.845 kg) M Vag-Spont EPI  LIV  1 Preterm 02/04/17 [redacted]w[redacted]d 08:21 / 00:23 3 lb 2.6 oz (1.435 kg) F Vag-Spont EPI N FD     Complications: Severe pre-eclampsia, Placental abruption   Physical Assessment:   Vitals:   12/25/17 1211  BP: 128/81  Pulse: 60  Weight: 170 lb 8 oz (77.3 kg)  Height: 5\' 5"  (1.651 m)    Body mass index is 28.37 kg/m.       Physical Examination:   General appearance: alert, well appearing, and in no distress  Mental status: alert, oriented to person, place, and time  Skin: warm & dry   Cardiovascular: normal heart rate noted   Respiratory: normal respiratory effort, no distress   Breasts: deferred, no complaints   Abdomen: soft, non-tender   Pelvic: VULVA: normal appearing vulva with no masses, tenderness or lesions, UTERUS: uterus is normal size, shape, consistency and nontender  Rectal: no hemorrhoids  Extremities: no edema       Results for orders placed or performed in visit on 12/25/17 (from the past 24 hour(s))  POCT urine pregnancy   Collection Time: 12/25/17 12:15 PM  Result Value Ref Range   Preg Test, Ur Negative Negative    Assessment & Plan:  1) Postpartum exam 2) 4 wks s/p SVB @ 36wks d/t spontaneous labor 3) Bottlefeeding 4) Depression screening 5) Contraception counseling, pt prefers Depo-Provera injections  Meds:  Meds ordered this encounter  Medications  . medroxyPROGESTERone (DEPO-PROVERA) 150 MG/ML injection    Sig: Inject 1 mL (150 mg total) into the muscle every 3 (three) months.    Dispense:  1 mL    Refill:  3    Order Specific Question:   Supervising Provider    Answer:   Lazaro Arms [[id number]]    Follow-up: Return for today for depo, then Nov for physical.   Orders Placed This Encounter  Procedures  .  POCT urine pregnancy    Cheral MarkerKimberly R [place] Sorn CNM, [facility] 12/25/2017 12:26 PM

## 2017-12-25 NOTE — Patient Instructions (Signed)

## 2017-12-26 ENCOUNTER — Ambulatory Visit: Payer: 59 | Admitting: Women's Health

## 2018-03-26 ENCOUNTER — Other Ambulatory Visit: Payer: 59 | Admitting: Women's Health

## 2018-06-01 ENCOUNTER — Encounter: Payer: Self-pay | Admitting: Women's Health

## 2018-06-01 ENCOUNTER — Other Ambulatory Visit (HOSPITAL_COMMUNITY)
Admission: RE | Admit: 2018-06-01 | Discharge: 2018-06-01 | Disposition: A | Discharge status: Home / Self Care | Payer: Medicaid Other | Source: Ambulatory Visit | Attending: Obstetrics & Gynecology | Admitting: Obstetrics & Gynecology

## 2018-06-01 ENCOUNTER — Ambulatory Visit (INDEPENDENT_AMBULATORY_CARE_PROVIDER_SITE_OTHER): Payer: Medicaid Other | Admitting: Women's Health

## 2018-06-01 VITALS — BP 114/73 | HR 80 | Ht 64.0 in | Wt 173.0 lb

## 2018-06-01 DIAGNOSIS — Z01419 Encounter for gynecological examination (general) (routine) without abnormal findings: Secondary | ICD-10-CM

## 2018-06-01 DIAGNOSIS — Z3009 Encounter for other general counseling and advice on contraception: Secondary | ICD-10-CM

## 2018-06-01 DIAGNOSIS — Z113 Encounter for screening for infections with a predominantly sexual mode of transmission: Secondary | ICD-10-CM | POA: Diagnosis not present

## 2018-06-01 DIAGNOSIS — Z0000 Encounter for general adult medical examination without abnormal findings: Secondary | ICD-10-CM | POA: Diagnosis not present

## 2018-06-01 MED ORDER — MEDROXYPROGESTERONE ACETATE 150 MG/ML IM SUSP: 150.0000 mg | INTRAMUSCULAR | 3 refills | Status: DC

## 2018-06-01 NOTE — Progress Notes (Signed)
   WELL-WOMAN EXAMINATION Patient name: Jacqueline Barrera MRN [id number]  Date of birth: 10-11-1995 Chief Complaint:   Gynecologic Exam (pap/physcial/ wants get back on depo)  History of Present Illness:   Jacqueline Barrera is a 23 y.o. 482P0201 African American female being seen today for a routine FP Mcaid well-woman exam.  Current complaints: none Wants to restart depo. Saw her 8/8 for postpartum visit, rx'd depo, states pharmacy told her she didn't have depo. States she had a vial at home, so her mom gave her 1 injection in Aug, none since. Had sex yesterday, no condom.   PCP: none      does not desire labs, other than FP Mcaid labs Patient's last menstrual period was 05/22/2018. The current method of family planning is none and wants depo Last pap 04/18/16. Results were: normal Last mammogram: never. Results were: n/a. Family h/o breast cancer: No Last colonoscopy: never. Results were: n/a. Family h/o colorectal cancer: No Review of Systems:   Pertinent items are noted in HPI Denies any headaches, blurred vision, fatigue, shortness of breath, chest pain, abdominal pain, abnormal vaginal discharge/itching/odor/irritation, problems with periods, bowel movements, urination, or intercourse unless otherwise stated above. Pertinent History Reviewed:  Reviewed past medical,surgical, social and family history.  Reviewed problem list, medications and allergies. Physical Assessment:   Vitals:   06/01/18 1024  BP: 114/73  Pulse: 80  Weight: 173 lb (78.5 kg)  Height: 5\' 4"  (1.626 m)  Body mass index is 29.7 kg/m.        Physical Examination:   General appearance - well appearing, and in no distress  Mental status - alert, oriented to person, place, and time  Psych:  She has a normal mood and affect  Skin - warm and dry, normal color, no suspicious lesions noted  Chest - effort normal, all lung fields clear to auscultation bilaterally  Heart - normal rate and regular rhythm  Neck:  midline  trachea, no thyromegaly or nodules  Breasts - breasts appear normal, no suspicious masses, no skin or nipple changes or  axillary nodes  Abdomen - soft, nontender, nondistended, no masses or organomegaly  Pelvic - VULVA: normal appearing vulva with no masses, tenderness or lesions  VAGINA: normal appearing vagina with normal color and discharge, no lesions  CERVIX: normal appearing cervix without discharge or lesions, no CMT  Thin prep pap is done w/ reflex HR HPV cotesting  UTERUS: uterus is felt to be normal size, shape, consistency and nontender   ADNEXA: No adnexal masses or tenderness noted.  Extremities:  No swelling or varicosities noted  No results found for this or any previous visit (from the past 24 hour(s)).  Assessment & Plan:  1) Well-Woman Exam  2) Contraception management> refilled depo, no sex until after injection then condoms x 2wks  Labs/procedures today: gc/ct on pap, hiv, rpr  Mammogram @25yo  or sooner if problems Colonoscopy @45 -50yo or sooner if problems  Orders Placed This Encounter  Procedures  . HIV Antibody (routine testing w rflx)  . RPR    Follow-up: Return for 1/27 am hcg/pm depo; then 2223yr for physical.  Cheral MarkerKimberly R Jayquan Bradsher CNM, WHNP-BC 06/01/2018 10:50 AM

## 2018-06-01 NOTE — Addendum Note (Signed)
Addended by: Federico Flake A on: 06/01/2018 10:56 AM   Modules accepted: Orders

## 2018-06-01 NOTE — Patient Instructions (Signed)
NO SEX UNTIL AFTER YOU GET YOUR BIRTH CONTROL   Medroxyprogesterone injection [Contraceptive] What is this medicine? MEDROXYPROGESTERONE (me DROX ee proe JES te rone) contraceptive injections prevent pregnancy. They provide effective birth control for 3 months. Depo-subQ Provera 104 is also used for treating pain related to endometriosis. This medicine may be used for other purposes; ask your health care provider or pharmacist if you have questions. COMMON BRAND NAME(S): Depo-Provera, Depo-subQ Provera 104 What should I tell my health care provider before I take this medicine? They need to know if you have any of these conditions: -frequently drink alcohol -asthma -blood vessel disease or a history of a blood clot in the lungs or legs -bone disease such as osteoporosis -breast cancer -diabetes -eating disorder (anorexia nervosa or bulimia) -high blood pressure -HIV infection or AIDS -kidney disease -liver disease -mental depression -migraine -seizures (convulsions) -stroke -tobacco smoker -vaginal bleeding -an unusual or allergic reaction to medroxyprogesterone, other hormones, medicines, foods, dyes, or preservatives -pregnant or trying to get pregnant -breast-feeding How should I use this medicine? Depo-Provera Contraceptive injection is given into a muscle. Depo-subQ Provera 104 injection is given under the skin. These injections are given by a health care professional. You must not be pregnant before getting an injection. The injection is usually given during the first 5 days after the start of a menstrual period or 6 weeks after delivery of a baby. Talk to your pediatrician regarding the use of this medicine in children. Special care may be needed. These injections have been used in female children who have started having menstrual periods. Overdosage: If you think you have taken too much of this medicine contact a poison control center or emergency room at once. NOTE: This  medicine is only for you. Do not share this medicine with others. What if I miss a dose? Try not to miss a dose. You must get an injection once every 3 months to maintain birth control. If you cannot keep an appointment, call and reschedule it. If you wait longer than 13 weeks between Depo-Provera contraceptive injections or longer than 14 weeks between Depo-subQ Provera 104 injections, you could get pregnant. Use another method for birth control if you miss your appointment. You may also need a pregnancy test before receiving another injection. What may interact with this medicine? Do not take this medicine with any of the following medications: -bosentan This medicine may also interact with the following medications: -aminoglutethimide -antibiotics or medicines for infections, especially rifampin, rifabutin, rifapentine, and griseofulvin -aprepitant -barbiturate medicines such as phenobarbital or primidone -bexarotene -carbamazepine -medicines for seizures like ethotoin, felbamate, oxcarbazepine, phenytoin, topiramate -modafinil -St. John's wort This list may not describe all possible interactions. Give your health care provider a list of all the medicines, herbs, non-prescription drugs, or dietary supplements you use. Also tell them if you smoke, drink alcohol, or use illegal drugs. Some items may interact with your medicine. What should I watch for while using this medicine? This drug does not protect you against HIV infection (AIDS) or other sexually transmitted diseases. Use of this product may cause you to lose calcium from your bones. Loss of calcium may cause weak bones (osteoporosis). Only use this product for more than 2 years if other forms of birth control are not right for you. The longer you use this product for birth control the more likely you will be at risk for weak bones. Ask your health care professional how you can keep strong bones. You may have a change   in bleeding pattern  or irregular periods. Many females stop having periods while taking this drug. If you have received your injections on time, your chance of being pregnant is very low. If you think you may be pregnant, see your health care professional as soon as possible. Tell your health care professional if you want to get pregnant within the next year. The effect of this medicine may last a long time after you get your last injection. What side effects may I notice from receiving this medicine? Side effects that you should report to your doctor or health care professional as soon as possible: -allergic reactions like skin rash, itching or hives, swelling of the face, lips, or tongue -breast tenderness or discharge -breathing problems -changes in vision -depression -feeling faint or lightheaded, falls -fever -pain in the abdomen, chest, groin, or leg -problems with balance, talking, walking -unusually weak or tired -yellowing of the eyes or skin Side effects that usually do not require medical attention (report to your doctor or health care professional if they continue or are bothersome): -acne -fluid retention and swelling -headache -irregular periods, spotting, or absent periods -temporary pain, itching, or skin reaction at site where injected -weight gain This list may not describe all possible side effects. Call your doctor for medical advice about side effects. You may report side effects to FDA at 1-800-FDA-1088. Where should I keep my medicine? This does not apply. The injection will be given to you by a health care professional. NOTE: This sheet is a summary. It may not cover all possible information. If you have questions about this medicine, talk to your doctor, pharmacist, or health care provider.  2019 Elsevier/Gold Standard (2008-05-27 18:37:56)  

## 2018-06-02 LAB — CYTOLOGY - PAP
Chlamydia: NEGATIVE
Diagnosis: NEGATIVE
Neisseria Gonorrhea: NEGATIVE

## 2018-06-02 LAB — HIV ANTIBODY (ROUTINE TESTING W REFLEX): HIV SCREEN 4TH GENERATION: NONREACTIVE

## 2018-06-02 LAB — RPR: RPR Ser Ql: NONREACTIVE

## 2018-06-15 ENCOUNTER — Ambulatory Visit: Payer: Medicaid Other

## 2018-06-15 ENCOUNTER — Other Ambulatory Visit: Payer: Medicaid Other

## 2018-06-16 ENCOUNTER — Ambulatory Visit: Payer: Medicaid Other

## 2018-06-16 ENCOUNTER — Other Ambulatory Visit: Payer: Medicaid Other

## 2018-06-23 ENCOUNTER — Ambulatory Visit: Payer: Medicaid Other

## 2018-06-23 ENCOUNTER — Other Ambulatory Visit: Payer: Medicaid Other

## 2018-06-23 DIAGNOSIS — Z3042 Encounter for surveillance of injectable contraceptive: Secondary | ICD-10-CM | POA: Diagnosis not present

## 2018-06-23 LAB — BETA HCG QUANT (REF LAB): hCG Quant: <1 m[IU]/mL

## 2018-09-30 DIAGNOSIS — N760 Acute vaginitis: Secondary | ICD-10-CM | POA: Diagnosis not present

## 2018-09-30 DIAGNOSIS — Z0389 Encounter for observation for other suspected diseases and conditions ruled out: Secondary | ICD-10-CM | POA: Diagnosis not present

## 2018-09-30 DIAGNOSIS — Z113 Encounter for screening for infections with a predominantly sexual mode of transmission: Secondary | ICD-10-CM | POA: Diagnosis not present

## 2018-09-30 DIAGNOSIS — Z1388 Encounter for screening for disorder due to exposure to contaminants: Secondary | ICD-10-CM | POA: Diagnosis not present

## 2018-09-30 DIAGNOSIS — Z114 Encounter for screening for human immunodeficiency virus [HIV]: Secondary | ICD-10-CM | POA: Diagnosis not present

## 2018-09-30 DIAGNOSIS — B373 Candidiasis of vulva and vagina: Secondary | ICD-10-CM | POA: Diagnosis not present

## 2018-09-30 DIAGNOSIS — Z3009 Encounter for other general counseling and advice on contraception: Secondary | ICD-10-CM | POA: Diagnosis not present

## 2018-12-10 ENCOUNTER — Other Ambulatory Visit: Payer: Self-pay

## 2018-12-10 ENCOUNTER — Other Ambulatory Visit: Payer: Medicaid Other

## 2018-12-10 DIAGNOSIS — Z20822 Contact with and (suspected) exposure to covid-19: Secondary | ICD-10-CM

## 2018-12-13 LAB — NOVEL CORONAVIRUS, NAA: SARS-CoV-2, NAA: NOT DETECTED

## 2019-02-12 ENCOUNTER — Other Ambulatory Visit: Payer: Self-pay

## 2019-02-12 DIAGNOSIS — Z20822 Contact with and (suspected) exposure to covid-19: Secondary | ICD-10-CM

## 2019-02-12 DIAGNOSIS — R6889 Other general symptoms and signs: Secondary | ICD-10-CM | POA: Diagnosis not present

## 2019-02-13 LAB — NOVEL CORONAVIRUS, NAA: SARS-CoV-2, NAA: NOT DETECTED

## 2019-03-08 ENCOUNTER — Other Ambulatory Visit: Payer: Self-pay

## 2019-03-08 ENCOUNTER — Ambulatory Visit (INDEPENDENT_AMBULATORY_CARE_PROVIDER_SITE_OTHER): Payer: Medicaid Other | Admitting: Otolaryngology

## 2019-03-08 DIAGNOSIS — H66011 Acute suppurative otitis media with spontaneous rupture of ear drum, right ear: Secondary | ICD-10-CM | POA: Diagnosis not present

## 2019-03-22 ENCOUNTER — Ambulatory Visit (INDEPENDENT_AMBULATORY_CARE_PROVIDER_SITE_OTHER): Payer: Medicaid Other | Admitting: Otolaryngology

## 2019-03-22 DIAGNOSIS — H66011 Acute suppurative otitis media with spontaneous rupture of ear drum, right ear: Secondary | ICD-10-CM | POA: Diagnosis not present

## 2019-03-22 DIAGNOSIS — H9011 Conductive hearing loss, unilateral, right ear, with unrestricted hearing on the contralateral side: Secondary | ICD-10-CM | POA: Diagnosis not present

## 2019-03-28 ENCOUNTER — Inpatient Hospital Stay (HOSPITAL_COMMUNITY)
Admission: EM | Admit: 2019-03-28 | Discharge: 2019-03-28 | Disposition: A | Discharge status: Home / Self Care | Payer: Medicaid Other | Attending: Obstetrics and Gynecology | Admitting: Obstetrics and Gynecology

## 2019-03-28 ENCOUNTER — Other Ambulatory Visit: Payer: Self-pay

## 2019-03-28 ENCOUNTER — Inpatient Hospital Stay (HOSPITAL_COMMUNITY): Payer: Medicaid Other

## 2019-03-28 ENCOUNTER — Encounter (HOSPITAL_COMMUNITY): Payer: Self-pay

## 2019-03-28 DIAGNOSIS — O208 Other hemorrhage in early pregnancy: Secondary | ICD-10-CM | POA: Diagnosis not present

## 2019-03-28 DIAGNOSIS — F17290 Nicotine dependence, other tobacco product, uncomplicated: Secondary | ICD-10-CM | POA: Insufficient documentation

## 2019-03-28 DIAGNOSIS — Z3A01 Less than 8 weeks gestation of pregnancy: Secondary | ICD-10-CM

## 2019-03-28 DIAGNOSIS — O23591 Infection of other part of genital tract in pregnancy, first trimester: Secondary | ICD-10-CM | POA: Insufficient documentation

## 2019-03-28 DIAGNOSIS — O210 Mild hyperemesis gravidarum: Secondary | ICD-10-CM | POA: Diagnosis not present

## 2019-03-28 DIAGNOSIS — B9689 Other specified bacterial agents as the cause of diseases classified elsewhere: Secondary | ICD-10-CM

## 2019-03-28 DIAGNOSIS — R109 Unspecified abdominal pain: Secondary | ICD-10-CM | POA: Diagnosis present

## 2019-03-28 DIAGNOSIS — O99331 Smoking (tobacco) complicating pregnancy, first trimester: Secondary | ICD-10-CM | POA: Insufficient documentation

## 2019-03-28 DIAGNOSIS — Z3490 Encounter for supervision of normal pregnancy, unspecified, unspecified trimester: Secondary | ICD-10-CM

## 2019-03-28 LAB — URINALYSIS, ROUTINE W REFLEX MICROSCOPIC
Bilirubin Urine: NEGATIVE
Glucose, UA: NEGATIVE mg/dL
Hgb urine dipstick: NEGATIVE
Ketones, ur: NEGATIVE mg/dL
Nitrite: NEGATIVE
Protein, ur: 30 mg/dL — AB
Specific Gravity, Urine: 1.026 (ref 1.005–1.030)
Squamous Epithelial / HPF: >50 — ABNORMAL HIGH (ref 0–5)
pH: 6 (ref 5.0–8.0)

## 2019-03-28 LAB — WET PREP, GENITAL
Sperm: NONE SEEN
Trich, Wet Prep: NONE SEEN
Yeast Wet Prep HPF POC: NONE SEEN

## 2019-03-28 LAB — CBC
HCT: 36.5 % (ref 36.0–46.0)
Hemoglobin: 11.9 g/dL — ABNORMAL LOW (ref 12.0–15.0)
MCH: 28.2 pg (ref 26.0–34.0)
MCHC: 32.6 g/dL (ref 30.0–36.0)
MCV: 86.5 fL (ref 80.0–100.0)
Platelets: 292 10*3/uL (ref 150–400)
RBC: 4.22 MIL/uL (ref 3.87–5.11)
RDW: 13 % (ref 11.5–15.5)
WBC: 9.1 10*3/uL (ref 4.0–10.5)
nRBC: 0 % (ref 0.0–0.2)

## 2019-03-28 LAB — COMPREHENSIVE METABOLIC PANEL
ALT: 26 U/L (ref 0–44)
AST: 35 U/L (ref 15–41)
Albumin: 3.9 g/dL (ref 3.5–5.0)
Alkaline Phosphatase: 59 U/L (ref 38–126)
Anion gap: 8 (ref 5–15)
BUN: 8 mg/dL (ref 6–20)
CO2: 25 mmol/L (ref 22–32)
Calcium: 9.2 mg/dL (ref 8.9–10.3)
Chloride: 106 mmol/L (ref 98–111)
Creatinine, Ser: 0.71 mg/dL (ref 0.44–1.00)
GFR calc Af Amer: >60 mL/min (ref >60)
GFR calc non Af Amer: >60 mL/min (ref >60)
Glucose, Bld: 98 mg/dL (ref 70–99)
Potassium: 3.6 mmol/L (ref 3.5–5.1)
Sodium: 139 mmol/L (ref 135–145)
Total Bilirubin: 0.3 mg/dL (ref 0.3–1.2)
Total Protein: 6.9 g/dL (ref 6.5–8.1)

## 2019-03-28 LAB — HCG, QUANTITATIVE, PREGNANCY: hCG, Beta Chain, Quant, S: 83961 m[IU]/mL — ABNORMAL HIGH (ref <5)

## 2019-03-28 LAB — POC URINE PREG, ED: Preg Test, Ur: POSITIVE — AB

## 2019-03-28 MED ORDER — ONDANSETRON HCL 4 MG PO TABS: 8.0000 mg | ORAL_TABLET | Freq: Once | ORAL | Status: DC

## 2019-03-28 MED ORDER — ONDANSETRON 8 MG PO TBDP: 8.0000 mg | ORAL_TABLET | Freq: Three times a day (TID) | ORAL | 0 refills | Status: DC | PRN

## 2019-03-28 MED ORDER — METRONIDAZOLE 500 MG PO TABS: 500.0000 mg | ORAL_TABLET | Freq: Two times a day (BID) | ORAL | 0 refills | Status: DC

## 2019-03-28 MED ORDER — ONDANSETRON 4 MG PO TBDP
8.0000 mg | ORAL_TABLET | Freq: Once | ORAL | Status: AC
  Administered 2019-03-28: 04:00:00 8 mg via ORAL
  Filled 2019-03-28: qty 2

## 2019-03-28 NOTE — Discharge Instructions (Signed)
Eating Plan for Pregnant Women °While you are pregnant, your body requires additional nutrition to help support your growing baby. You also have a higher need for some vitamins and minerals, such as folic acid, calcium, iron, and vitamin D. Eating a healthy, well-balanced diet is very important for your health and your baby's health. Your need for extra calories varies for the three 3-month segments of your pregnancy (trimesters). For most women, it is recommended to consume: °· 150 extra calories a day during the first trimester. °· 300 extra calories a day during the second trimester. °· 300 extra calories a day during the third trimester. °What are tips for following this plan? ° °· Do not try to lose weight or go on a diet during pregnancy. °· Limit your overall intake of foods that have "empty calories." These are foods that have little nutritional value, such as sweets, desserts, candies, and sugar-sweetened beverages. °· Eat a variety of foods (especially fruits and vegetables) to get a full range of vitamins and minerals. °· Take a prenatal vitamin to help meet your additional vitamin and mineral needs during pregnancy, specifically for folic acid, iron, calcium, and vitamin D. °· Remember to stay active. Ask your health care provider what types of exercise and activities are safe for you. °· Practice good food safety and cleanliness. Wash your hands before you eat and after you prepare raw meat. Wash all fruits and vegetables well before peeling or eating. Taking these actions can help to prevent food-borne illnesses that can be very dangerous to your baby, such as listeriosis. Ask your health care provider for more information about listeriosis. °What does 150 extra calories look like? °Healthy options that provide 150 extra calories each day could be any of the following: °· 6-8 oz (170-230 g) of plain low-fat yogurt with ½ cup of berries. °· 1 apple with 2 teaspoons (11 g) of peanut butter. °· Cut-up  vegetables with ¼ cup (60 g) of hummus. °· 8 oz (230 mL) or 1 cup of low-fat chocolate milk. °· 1 stick of string cheese with 1 medium orange. °· 1 peanut butter and jelly sandwich that is made with one slice of whole-wheat bread and 1 tsp (5 g) of peanut butter. °For 300 extra calories, you could eat two of those healthy options each day. °What is a healthy amount of weight to gain? °The right amount of weight gain for you is based on your BMI before you became pregnant. If your BMI: °· Was less than 18 (underweight), you should gain 28-40 lb (13-18 kg). °· Was 18-24.9 (normal), you should gain 25-35 lb (11-16 kg). °· Was 25-29.9 (overweight), you should gain 15-25 lb (7-11 kg). °· Was 30 or greater (obese), you should gain 11-20 lb (5-9 kg). °What if I am having twins or multiples? °Generally, if you are carrying twins or multiples: °· You may need to eat 300-600 extra calories a day. °· The recommended range for total weight gain is 25-54 lb (11-25 kg), depending on your BMI before pregnancy. °· Talk with your health care provider to find out about nutritional needs, weight gain, and exercise that is right for you. °What foods can I eat? ° °Grains °All grains. Choose whole grains, such as whole-wheat bread, oatmeal, or brown rice. °Vegetables °All vegetables. Eat a variety of colors and types of vegetables. Remember to wash your vegetables well before peeling or eating. °Fruits °All fruits. Eat a variety of colors and types of fruit. Remember to wash   your fruits well before peeling or eating. °Meats and other protein foods °Lean meats, including chicken, turkey, fish, and lean cuts of beef, veal, or pork. If you eat fish or seafood, choose options that are higher in omega-3 fatty acids and lower in mercury, such as salmon, herring, mussels, trout, sardines, pollock, shrimp, crab, and lobster. Tofu. Tempeh. Beans. Eggs. Peanut butter and other nut butters. Make sure that all meats, poultry, and eggs are cooked to  food-safe temperatures or "well-done." °Two or more servings of fish are recommended each week in order to get the most benefits from omega-3 fatty acids that are found in seafood. Choose fish that are lower in mercury. You can find more information online: °· www.fda.gov °Dairy °Pasteurized milk and milk alternatives (such as almond milk). Pasteurized yogurt and pasteurized cheese. Cottage cheese. Sour cream. °Beverages °Water. Juices that contain 100% fruit juice or vegetable juice. Caffeine-free teas and decaffeinated coffee. °Drinks that contain caffeine are okay to drink, but it is better to avoid caffeine. Keep your total caffeine intake to less than 200 mg each day (which is 12 oz or 355 mL of coffee, tea, or soda) or the limit as told by your health care provider. °Fats and oils °Fats and oils are okay to include in moderation. °Sweets and desserts °Sweets and desserts are okay to include in moderation. °Seasoning and other foods °All pasteurized condiments. °The items listed above may not be a complete list of recommended foods and beverages. Contact your dietitian for more options. °The items listed above may not be a complete list of foods and beverages [you/your child] can eat. Contact a dietitian for more information. °What foods are not recommended? °Vegetables °Raw (unpasteurized) vegetable juices. °Fruits °Unpasteurized fruit juices. °Meats and other protein foods °Lunch meats, bologna, hot dogs, or other deli meats. (If you must eat those meats, reheat them until they are steaming hot.) Refrigerated paté, meat spreads from a meat counter, smoked seafood that is found in the refrigerated section of a store. Raw or undercooked meats, poultry, and eggs. Raw fish, such as sushi or sashimi. Fish that have high mercury content, such as tilefish, shark, swordfish, and king mackerel. °To learn more about mercury in fish, talk with your health care provider or look for online resources, such  as: °· www.fda.gov °Dairy °Raw (unpasteurized) milk and any foods that have raw milk in them. Soft cheeses, such as feta, queso blanco, queso fresco, Brie, Camembert cheeses, blue-veined cheeses, and Panela cheese (unless it is made with pasteurized milk, which must be stated on the label). °Beverages °Alcohol. Sugar-sweetened beverages, such as sodas, teas, or energy drinks. °Seasoning and other foods °Homemade fermented foods and drinks, such as pickles, sauerkraut, or kombucha drinks. (Store-bought pasteurized versions of these are okay.) °Salads that are made in a store or deli, such as ham salad, chicken salad, egg salad, tuna salad, and seafood salad. °The items listed above may not be a complete list of foods and beverages to avoid. Contact your dietitian for more information. °The items listed above may not be a complete list of foods and beverages [you/your child] should avoid. Contact a dietitian for more information. °Where to find more information °To calculate the number of calories you need based on your height, weight, and activity level, you can use an online calculator such as: °· www.choosemyplate.gov/MyPlatePlan °To calculate how much weight you should gain during pregnancy, you can use an online pregnancy weight gain calculator such as: °· www.choosemyplate.gov/pregnancy-weight-gain-calculator °Summary °· While you   are pregnant, your body requires additional nutrition to help support your growing baby.  Eat a variety of foods, especially fruits and vegetables to get a full range of vitamins and minerals.  Practice good food safety and cleanliness. Wash your hands before you eat and after you prepare raw meat. Wash all fruits and vegetables well before peeling or eating. Taking these actions can help to prevent food-borne illnesses, such as listeriosis, that can be very dangerous to your baby.  Do not eat raw meat or fish. Do not eat fish that have high mercury content, such as tilefish,  shark, swordfish, and king mackerel. Do not eat unpasteurized (raw) dairy.  Take a prenatal vitamin to help meet your additional vitamin and mineral needs during pregnancy, specifically for folic acid, iron, calcium, and vitamin D. This information is not intended to replace advice given to you by your health care provider. Make sure you discuss any questions you have with your health care provider. Document Released: 02/18/2014 Document Revised: 08/27/2018 Document Reviewed: 01/31/2017 Elsevier Patient Education  2020 [employer]. Bacterial Vaginosis  Bacterial vaginosis is a vaginal infection that occurs when the normal balance of bacteria in the vagina is disrupted. It results from an overgrowth of certain bacteria. This is the most common vaginal infection among women ages 3-44. Because bacterial vaginosis increases your risk for STIs (sexually transmitted infections), getting treated can help reduce your risk for chlamydia, gonorrhea, herpes, and HIV (human immunodeficiency virus). Treatment is also important for preventing complications in pregnant women, because this condition can cause an early (premature) delivery. What are the causes? This condition is caused by an increase in harmful bacteria that are normally present in small amounts in the vagina. However, the reason that the condition develops is not fully understood. What increases the risk? The following factors may make you more likely to develop this condition:  Having a new sexual partner or multiple sexual partners.  Having unprotected sex.  Douching.  Having an intrauterine device (IUD).  Smoking.  Drug and alcohol abuse.  Taking certain antibiotic medicines.  Being pregnant. You cannot get bacterial vaginosis from toilet seats, bedding, swimming pools, or contact with objects around you. What are the signs or symptoms? Symptoms of this condition include:  Grey or white vaginal discharge. The discharge can  also be watery or foamy.  A fish-like odor with discharge, especially after sexual intercourse or during menstruation.  Itching in and around the vagina.  Burning or pain with urination. Some women with bacterial vaginosis have no signs or symptoms. How is this diagnosed? This condition is diagnosed based on:  Your medical history.  A physical exam of the vagina.  Testing a sample of vaginal fluid under a microscope to look for a large amount of bad bacteria or abnormal cells. Your health care provider may use a cotton swab or a small wooden spatula to collect the sample. How is this treated? This condition is treated with antibiotics. These may be given as a pill, a vaginal cream, or a medicine that is put into the vagina (suppository). If the condition comes back after treatment, a second round of antibiotics may be needed. Follow these instructions at home: Medicines  Take over-the-counter and prescription medicines only as told by your health care provider.  Take or use your antibiotic as told by your health care provider. Do not stop taking or using the antibiotic even if you start to feel better. General instructions  If you have a female sexual  partner, tell her that you have a vaginal infection. She should see her health care provider and be treated if she has symptoms. If you have a female sexual partner, he does not need treatment.  During treatment: ? Avoid sexual activity until you finish treatment. ? Do not douche. ? Avoid alcohol as directed by your health care provider. ? Avoid breastfeeding as directed by your health care provider.  Drink enough water and fluids to keep your urine clear or pale yellow.  Keep the area around your vagina and rectum clean. ? Wash the area daily with warm water. ? Wipe yourself from front to back after using the toilet.  Keep all follow-up visits as told by your health care provider. This is important. How is this prevented?  Do  not douche.  Wash the outside of your vagina with warm water only.  Use protection when having sex. This includes latex condoms and dental dams.  Limit how many sexual partners you have. To help prevent bacterial vaginosis, it is best to have sex with just one partner (monogamous).  Make sure you and your sexual partner are tested for STIs.  Wear cotton or cotton-lined underwear.  Avoid wearing tight pants and pantyhose, especially during summer.  Limit the amount of alcohol that you drink.  Do not use any products that contain nicotine or tobacco, such as cigarettes and e-cigarettes. If you need help quitting, ask your health care provider.  Do not use illegal drugs. Where to find more information  Centers for Disease Control and Prevention: AppraiserFraud.fi  American Sexual Health Association (ASHA): www.ashastd.org  [place] Department of Health and Financial controller, Office on Women's Health: DustingSprays.pl or SecuritiesCard.it Contact a health care provider if:  Your symptoms do not improve, even after treatment.  You have more discharge or pain when urinating.  You have a fever.  You have pain in your abdomen.  You have pain during sex.  You have vaginal bleeding between periods. Summary  Bacterial vaginosis is a vaginal infection that occurs when the normal balance of bacteria in the vagina is disrupted.  Because bacterial vaginosis increases your risk for STIs (sexually transmitted infections), getting treated can help reduce your risk for chlamydia, gonorrhea, herpes, and HIV (human immunodeficiency virus). Treatment is also important for preventing complications in pregnant women, because the condition can cause an early (premature) delivery.  This condition is treated with antibiotic medicines. These may be given as a pill, a vaginal cream, or a medicine that is put into the vagina (suppository). This information is not  intended to replace advice given to you by your health care provider. Make sure you discuss any questions you have with your health care provider. Document Released: 05/06/2005 Document Revised: 04/18/2017 Document Reviewed: 01/20/2016 Elsevier Patient Education  2020 [employer].

## 2019-03-28 NOTE — ED Notes (Signed)
POC pregnancy test completed and pt pregnancy test is (+)

## 2019-03-28 NOTE — [facility] Provider Note (Signed)
Patient Jacqueline Barrera is a 23 y.o. [phone] At [redacted]w[redacted]d here with complaints of abdominal pain and nausea and vomiting. She denies vaginal bleeding, but she does endorse "a lot" of discharge. She has no idea how many weeks pregnant she is; her last Depo shot was in March or April and she has not been using contraception. She had a positive pregnancy test on Monday, Nov 2 (6 days ago).  History     CSN: [id number]  Arrival date and time: 03/28/19 0231   None     Chief Complaint  Patient presents with  . Abdominal Pain  . Emesis   Abdominal Pain This is a new problem. The current episode started today. The onset quality is sudden. The problem occurs constantly. The pain is located in the suprapubic region. The pain is at a severity of 10/10. The quality of the pain is cramping. The abdominal pain does not radiate. Associated symptoms include vomiting. Pertinent negatives include no constipation, diarrhea, dysuria, fever or flatus. Nothing aggravates the pain. The pain is relieved by nothing.  Emesis  Associated symptoms include abdominal pain. Pertinent negatives include no diarrhea or fever.    OB History    Gravida  3   Para  2   Term      Preterm  2   AB      Living  1     SAB      TAB      Ectopic      Multiple  0   Live Births  1           Past Medical History:  Diagnosis Date  . Anxiety   . Scoliosis     Past Surgical History:  Procedure Laterality Date  . ADENOIDECTOMY    . TONSILLECTOMY AND ADENOIDECTOMY    . WISDOM TOOTH EXTRACTION      Family History  Problem Relation Age of Onset  . Hyperlipidemia Maternal Aunt   . Hypertension Maternal Grandmother     Social History   Tobacco Use  . Smoking status: Current Every Day Smoker    Years: 3.00    Types: Cigars    Last attempt to quit: 02/03/2017    Years since quitting: 2.1  . Smokeless tobacco: Never Used  . Tobacco comment: smokes 1 cigar daily  Substance Use Topics  . Alcohol use: No    Alcohol/week: 35.0 standard drinks    Types: 35 Shots of liquor per week    Comment: before pregnancy  . Drug use: Yes    Frequency: 7.0 times per week    Types: Marijuana    Comment: pt smokes to eat; once a day    Allergies:  Allergies  Allergen Reactions  . Other Nausea And Vomiting    jalapenos    Medications Prior to Admission  Medication Sig Dispense Refill Last Dose  . medroxyPROGESTERone (DEPO-PROVERA) 150 MG/ML injection Inject 1 mL (150 mg total) into the muscle every 3 (three) months. 1 mL 3 More than a month at Unknown time  . Prenatal Vit-Fe Fumarate-FA (MULTIVITAMIN-PRENATAL) 27-0.8 MG TABS tablet Take 2 tablets by mouth daily at 12 noon. Gummy Prenatal Vitamins       Review of Systems  Constitutional: Negative for fever.  Gastrointestinal: Positive for abdominal pain and vomiting. Negative for constipation, diarrhea and flatus.  Genitourinary: Negative for dysuria.   Physical Exam   Blood pressure 127/77, pulse 89, temperature 98.8 F (37.1 C), temperature source Oral, resp. rate 19, height  5\' 4"  (1.626 m), weight 77 kg, SpO2 99 %, not currently breastfeeding.  Physical Exam  Constitutional: She is oriented to person, place, and time. She appears well-developed and well-nourished.  HENT:  Head: Normocephalic.  Neck: Normal range of motion.  GI: Soft.  Genitourinary:    Genitourinary Comments: NEFG; copious white amounts of discharge in the vagina; no lesions, no CMT, suprapubic or adnexal tenderness.    Musculoskeletal: Normal range of motion.  Neurological: She is alert and oriented to person, place, and time.  Skin: Skin is warm and dry.    [facility] Course  Procedures  MDM CBC, CMP, beta hcg and done to rule out ectopic pregnancy.  -wet prep shows clue cells -GC pending -UA normal, no signs of infection.  -US shows SIUP at 7 weeks 2 days; ectopic pregnancy excluded.   Assessment and Plan   1. Intrauterine pregnancy   2. Abdominal pain   3.  Morning sickness   4. Bacterial vaginosis     -RX for Flagyl given -RX for Zofran given -Keep prenatal visit on 11-12 at [facility].  -Strict first trimester return precautions given.   13-12 Telly Jawad 03/28/2019, 5:01 AM

## 2019-03-28 NOTE — MAU Note (Signed)
Pt here with complaints of lower abdominal cramping that started about 1 hour prior to arrival. Pt reports pain is 10/10. Has not tried anything for pain. She denies vaginal bleeding but reports a thick, white discharge that does not itch or have an odor. She also reports vomiting nonstop for the past 30 mins. Does not have Rx for nausea medicine. Also reports that while she was sitting in the lobby she started seeing spots and is worried about pre eclampsia. Pt appears tearful. Has appointment with [facility] on Wednesday. Unsure of LMP. Was on Depo prior to getting pregnant. Last Depo shot was in March or April.

## 2019-03-28 NOTE — ED Notes (Signed)
Pregnancy test being performed to confirm pregnancy. Per MAU, if (+), okay to send pt to MAU.

## 2019-03-29 LAB — GC/CHLAMYDIA PROBE AMP (~~LOC~~) NOT AT ARMC
Chlamydia: NEGATIVE
Comment: NEGATIVE
Comment: NORMAL
Neisseria Gonorrhea: NEGATIVE

## 2019-04-12 ENCOUNTER — Other Ambulatory Visit: Payer: Self-pay

## 2019-04-12 ENCOUNTER — Ambulatory Visit (INDEPENDENT_AMBULATORY_CARE_PROVIDER_SITE_OTHER): Payer: Medicaid Other | Admitting: Otolaryngology

## 2019-04-21 ENCOUNTER — Telehealth: Payer: Self-pay | Admitting: *Deleted

## 2019-04-21 ENCOUNTER — Other Ambulatory Visit: Payer: Self-pay | Admitting: Women's Health

## 2019-04-21 ENCOUNTER — Other Ambulatory Visit: Payer: Self-pay | Admitting: Otolaryngology

## 2019-04-21 ENCOUNTER — Other Ambulatory Visit (HOSPITAL_COMMUNITY): Payer: Self-pay | Admitting: Otolaryngology

## 2019-04-21 DIAGNOSIS — H7191 Unspecified cholesteatoma, right ear: Secondary | ICD-10-CM

## 2019-04-21 NOTE — Telephone Encounter (Signed)
Pt needs a refill on Depo. Has an appt tomorrow. Thanks!!

## 2019-04-21 NOTE — Telephone Encounter (Signed)
Will not refill depo. Pt appears to be pregnant per [facility]

## 2019-04-28 ENCOUNTER — Other Ambulatory Visit: Payer: Self-pay

## 2019-04-28 ENCOUNTER — Ambulatory Visit (HOSPITAL_COMMUNITY)
Admission: RE | Admit: 2019-04-28 | Discharge: 2019-04-28 | Disposition: A | Discharge status: Home / Self Care | Payer: Medicaid Other | Source: Ambulatory Visit | Attending: Otolaryngology | Admitting: Otolaryngology

## 2019-04-28 DIAGNOSIS — H9191 Unspecified hearing loss, right ear: Secondary | ICD-10-CM | POA: Diagnosis not present

## 2019-04-28 DIAGNOSIS — H7191 Unspecified cholesteatoma, right ear: Secondary | ICD-10-CM | POA: Insufficient documentation

## 2019-05-04 ENCOUNTER — Other Ambulatory Visit: Payer: Self-pay | Admitting: Women's Health

## 2019-05-10 ENCOUNTER — Other Ambulatory Visit: Payer: Self-pay | Admitting: Women's Health

## 2019-05-10 NOTE — Progress Notes (Signed)
Pt continues to send refill requests for depo. According to 03/28/19 hospital encounter, pt pregnant w/ quant [phone]. Pt has not responded to [employer] asking about current pregnancy status. Depo refilled in case pt had abortion and truly does need depo refilled. Contacted pharmacist at [facility] to update them/ have them verify pt not pregnant prior to giving her the refill.  Roma Schanz, CNM, [facility] 05/10/2019 10:36 AM

## 2019-06-02 ENCOUNTER — Ambulatory Visit
Admission: EM | Admit: 2019-06-02 | Discharge: 2019-06-02 | Disposition: A | Discharge status: Home / Self Care | Payer: Medicaid Other | Attending: Emergency Medicine | Admitting: Emergency Medicine

## 2019-06-02 ENCOUNTER — Other Ambulatory Visit: Payer: Self-pay

## 2019-06-02 DIAGNOSIS — Z20828 Contact with and (suspected) exposure to other viral communicable diseases: Secondary | ICD-10-CM | POA: Diagnosis not present

## 2019-06-02 DIAGNOSIS — Z20822 Contact with and (suspected) exposure to covid-19: Secondary | ICD-10-CM

## 2019-06-02 NOTE — ED Triage Notes (Signed)
Pt here for covid test after posiitve exposure Saturday

## 2019-06-02 NOTE — Discharge Instructions (Addendum)

## 2019-06-02 NOTE — ED Provider Notes (Signed)
[place]   [id number] 06/02/19 Arrival Time: 3329   CC: COVID exposure   SUBJECTIVE: History from: patient.  Jacqueline Barrera is a 24 y.o. female who presents for COVID testing.  Admit to positive COVID exposure 4 days ago.  Denies recent travel.  Denies aggravating or alleviating symptoms.  Denies previous COVID infection.   Denies fever, chills, fatigue, nasal congestion, rhinorrhea, sore throat, cough, SOB, wheezing, chest pain, nausea, vomiting, changes in bowel or bladder habits.     ROS: As per HPI.  All other pertinent ROS negative.     Past Medical History:  Diagnosis Date  . Anxiety   . Scoliosis    Past Surgical History:  Procedure Laterality Date  . ADENOIDECTOMY    . TONSILLECTOMY AND ADENOIDECTOMY    . WISDOM TOOTH EXTRACTION     Allergies  Allergen Reactions  . Other Nausea And Vomiting    jalapenos   No current facility-administered medications on file prior to encounter.   Current Outpatient Medications on File Prior to Encounter  Medication Sig Dispense Refill  . medroxyPROGESTERone Acetate 150 MG/ML SUSY INJECT 1ML  INTRAMUSCULARLY EVERY 3 MONTHS 1 mL 0  . Prenatal Vit-Fe Fumarate-FA (MULTIVITAMIN-PRENATAL) 27-0.8 MG TABS tablet Take 2 tablets by mouth daily at 12 noon. Gummy Prenatal Vitamins     Social History   Socioeconomic History  . Marital status: Single    Spouse name: Not on file  . Number of children: 1  . Years of education: Not on file  . Highest education level: Not on file  Occupational History  . Not on file  Tobacco Use  . Smoking status: Current Every Day Smoker    Years: 3.00    Types: Cigars    Last attempt to quit: 02/03/2017    Years since quitting: 2.3  . Smokeless tobacco: Never Used  . Tobacco comment: smokes 1 cigar daily  Substance and Sexual Activity  . Alcohol use: No    Alcohol/week: 35.0 standard drinks    Types: 35 Shots of liquor per week    Comment: before pregnancy  . Drug use: Yes   Frequency: 7.0 times per week    Types: Marijuana    Comment: pt smokes to eat; once a day  . Sexual activity: Yes    Partners: Male    Birth control/protection: Injection  Other Topics Concern  . Not on file  Social History Narrative  . Not on file   Social Determinants of Health   Financial Resource Strain:   . Difficulty of Paying Living Expenses: Not on file  Food Insecurity:   . Worried About Charity fundraiser in the Last Year: Not on file  . Ran Out of Food in the Last Year: Not on file  Transportation Needs:   . Lack of Transportation (Medical): Not on file  . Lack of Transportation (Non-Medical): Not on file  Physical Activity:   . Days of Exercise per Week: Not on file  . Minutes of Exercise per Session: Not on file  Stress:   . Feeling of Stress : Not on file  Social Connections:   . Frequency of Communication with Friends and Family: Not on file  . Frequency of Social Gatherings with Friends and Family: Not on file  . Attends Religious Services: Not on file  . Active Member of Clubs or Organizations: Not on file  . Attends Archivist Meetings: Not on file  . Marital Status: Not on file  Intimate  Partner Violence:   . Fear of Current or Ex-Partner: Not on file  . Emotionally Abused: Not on file  . Physically Abused: Not on file  . Sexually Abused: Not on file   Family History  Problem Relation Age of Onset  . Hyperlipidemia Maternal Aunt   . Hypertension Maternal Grandmother     OBJECTIVE:  Vitals:   06/02/19 1503  BP: 120/75  Pulse: 85  Resp: 20  Temp: 98.3 F (36.8 C)  SpO2: 97%     General appearance: alert; well-appearing, nontoxic; speaking in full sentences and tolerating own secretions HEENT: NCAT; Ears: EACs clear, TMs pearly gray; Eyes: PERRL.  EOM grossly intact.Nose: nares patent without rhinorrhea, Throat: oropharynx clear, tonsils non erythematous or enlarged, uvula midline  Neck: supple without LAD Lungs: unlabored  respirations, symmetrical air entry; cough: absent; no respiratory distress; CTAB Heart: regular rate and rhythm.  Skin: warm and dry Psychological: alert and cooperative; normal mood and affect  ASSESSMENT & PLAN:  1. Exposure to COVID-19 virus   2. Encounter for laboratory testing for COVID-19 virus    COVID testing ordered.  It will take between 5-7 days for test results.  Someone will contact you regarding abnormal results.    In the meantime: You should remain isolated in your home for 10 days from symptom onset AND greater than 72 hours after symptoms resolution (absence of fever without the use of fever-reducing medication and improvement in respiratory symptoms), whichever is longer OR 14 days from exposure Get plenty of rest and push fluids Use OTC zyrtec for nasal congestion, runny nose, and/or sore throat Use OTC flonase for nasal congestion and runny nose Use medications daily for symptom relief Use OTC medications like ibuprofen or tylenol as needed fever or pain Call or go to the ED if you have any new or worsening symptoms such as fever, cough, shortness of breath, chest tightness, chest pain, turning blue, changes in mental status, etc...    Reviewed expectations re: course of current medical issues. Questions answered. Outlined signs and symptoms indicating need for more acute intervention. Patient verbalized understanding. After Visit Summary given.         Rennis Harding, PA-C 06/02/19 1506

## 2019-06-03 LAB — NOVEL CORONAVIRUS, NAA: SARS-CoV-2, NAA: NOT DETECTED

## 2019-08-10 ENCOUNTER — Ambulatory Visit
Admission: EM | Admit: 2019-08-10 | Discharge: 2019-08-10 | Disposition: A | Discharge status: Home / Self Care | Payer: Medicaid Other | Attending: Emergency Medicine | Admitting: Emergency Medicine

## 2019-08-10 ENCOUNTER — Other Ambulatory Visit: Payer: Self-pay

## 2019-08-10 DIAGNOSIS — Z1152 Encounter for screening for COVID-19: Secondary | ICD-10-CM | POA: Diagnosis not present

## 2019-08-10 NOTE — Discharge Instructions (Signed)

## 2019-08-10 NOTE — ED Triage Notes (Signed)
Pt presents with complaints of needing a covid test for travel. Denies any symptoms.

## 2019-08-10 NOTE — ED Provider Notes (Signed)
[facility]    CSN: [id number] Arrival date & time: 08/10/19  1159      History   Chief Complaint No chief complaint on file.   HPI Jacqueline Barrera is a 24 y.o. female.   who presents for COVID testing.  Denies sick exposure to COVID, flu or strep.  Denies recent travel.  Denies aggravating or alleviating symptoms.  Denies previous COVID infection.   Denies fever, chills, fatigue, nasal congestion, rhinorrhea, sore throat, cough, SOB, wheezing, chest pain, nausea, vomiting, changes in bowel or bladder habits.    The history is provided by the patient. No language interpreter was used.    Past Medical History:  Diagnosis Date  . Anxiety   . Scoliosis     Patient Active Problem List   Diagnosis Date Noted  . History of preterm delivery 12/25/2017  . History of IUFD 06/10/2017  . H/O preeclampsia 06/10/2017  . Marijuana use 08/28/2016    Past Surgical History:  Procedure Laterality Date  . ADENOIDECTOMY    . TONSILLECTOMY AND ADENOIDECTOMY    . WISDOM TOOTH EXTRACTION      OB History    Gravida  3   Para  2   Term      Preterm  2   AB      Living  1     SAB      TAB      Ectopic      Multiple  0   Live Births  1            Home Medications    Prior to Admission medications   Medication Sig Start Date End Date Taking? Authorizing Provider  medroxyPROGESTERone Acetate 150 MG/ML SUSY INJECT  INTRAMUSCULARLY EVERY 3 MONTHS 05/10/19   Cheral Marker, CNM  Prenatal Vit-Fe Fumarate-FA (MULTIVITAMIN-PRENATAL) 27-0.8 MG TABS tablet Take 2 tablets by mouth daily at 12 noon. Gummy Prenatal Vitamins    [provider]    Family History Family History  Problem Relation Age of Onset  . Hyperlipidemia Maternal Aunt   . Hypertension Maternal Grandmother     Social History Social History   Tobacco Use  . Smoking status: Current Every Day Smoker    Years: 3.00    Types: Cigars    Last attempt to quit: 02/03/2017   Years since quitting: 2.5  . Smokeless tobacco: Never Used  . Tobacco comment: smokes 1 cigar daily  Substance Use Topics  . Alcohol use: No    Alcohol/week: 35.0 standard drinks    Types: 35 Shots of liquor per week    Comment: before pregnancy  . Drug use: Yes    Frequency: 7.0 times per week    Types: Marijuana    Comment: pt smokes to eat; once a day     Allergies   Other   Review of Systems Review of Systems  Constitutional: Negative.   HENT: Negative.   Respiratory: Negative.   Cardiovascular: Negative.   Gastrointestinal: Negative.   Genitourinary: Negative.   Musculoskeletal: Negative.   Skin: Negative.   Neurological: Negative.      Physical Exam Triage Vital Signs ED Triage Vitals  Enc Vitals Group     BP      Pulse      Resp      Temp      Temp src      SpO2      Weight      Height  Head Circumference      Peak Flow      Pain Score      Pain Loc      Pain Edu?      Excl. in [place]?    No data found.  Updated Vital Signs LMP  (LMP Unknown)   Visual Acuity Right Eye Distance:   Left Eye Distance:   Bilateral Distance:    Right Eye Near:   Left Eye Near:    Bilateral Near:     Physical Exam Vitals and nursing note reviewed.  Constitutional:      General: She is not in acute distress.    Appearance: Normal appearance. She is normal weight. She is not ill-appearing or toxic-appearing.  HENT:     Head: Normocephalic.     Right Ear: Tympanic membrane, ear canal and external ear normal. There is no impacted cerumen.     Left Ear: Tympanic membrane, ear canal and external ear normal. There is no impacted cerumen.     Nose: Nose normal. No congestion.     Mouth/Throat:     Mouth: Mucous membranes are moist.     Pharynx: Oropharynx is clear. No oropharyngeal exudate or posterior oropharyngeal erythema.  Cardiovascular:     Rate and Rhythm: Normal rate and regular rhythm.     Pulses: Normal pulses.     Heart sounds: Normal heart sounds.  No murmur.  Pulmonary:     Effort: Pulmonary effort is normal. No respiratory distress.     Breath sounds: Normal breath sounds. No wheezing or rhonchi.  Chest:     Chest wall: No tenderness.  Abdominal:     General: Abdomen is flat. Bowel sounds are normal. There is no distension.     Palpations: There is no mass.     Tenderness: There is no abdominal tenderness.  Skin:    Capillary Refill: Capillary refill takes less than 2 seconds.  Neurological:     General: No focal deficit present.     Mental Status: She is alert and oriented to person, place, and time.      UC Treatments / Results  Labs (all labs ordered are listed, but only abnormal results are displayed) Labs Reviewed - No data to display  EKG   Radiology No results found.  Procedures Procedures (including critical care time)  Medications Ordered in UC Medications - No data to display  Initial Impression / Assessment and Plan / UC Course  I have reviewed the triage vital signs and the nursing notes.  Pertinent labs & imaging results that were available during my care of the patient were reviewed by me and considered in my medical decision making (see chart for details).    Patient is stable for discharge COVID-19 test was ordered To quarantine Work note was given Return or go to ED for worsening of symptom  Final Clinical Impressions(s) / UC Diagnoses   Final diagnoses:  Encounter for screening for COVID-19     Discharge Instructions     COVID testing ordered.  It will take between 2-7 days for test results.  Someone will contact you regarding abnormal results.    In the meantime: You should remain isolated in your home for 10 days from symptom onset AND greater than 24 hours after symptoms resolution (absence of fever without the use of fever-reducing medication and improvement in respiratory symptoms), whichever is longer Get plenty of rest and push fluids Use medications daily for symptom  relief Use OTC medications  like ibuprofen or tylenol as needed fever or pain Call or go to the ED if you have any new or worsening symptoms such as fever, worsening cough, shortness of breath, chest tightness, chest pain, turning blue, changes in mental status, etc...     ED Prescriptions    None     PDMP not reviewed this encounter.   Durward Parcel, FNP 08/10/19 1220

## 2019-08-11 LAB — SARS-COV-2, NAA 2 DAY TAT

## 2019-08-11 LAB — NOVEL CORONAVIRUS, NAA: SARS-CoV-2, NAA: NOT DETECTED

## 2019-08-25 ENCOUNTER — Other Ambulatory Visit (HOSPITAL_COMMUNITY)
Admission: RE | Admit: 2019-08-25 | Discharge: 2019-08-25 | Disposition: A | Discharge status: Home / Self Care | Payer: Medicaid Other | Source: Ambulatory Visit | Attending: Obstetrics & Gynecology | Admitting: Obstetrics & Gynecology

## 2019-08-25 ENCOUNTER — Other Ambulatory Visit (INDEPENDENT_AMBULATORY_CARE_PROVIDER_SITE_OTHER): Payer: Medicaid Other

## 2019-08-25 ENCOUNTER — Other Ambulatory Visit: Payer: Self-pay

## 2019-08-25 VITALS — BP 139/76 | HR 99 | Ht 64.0 in | Wt 177.0 lb

## 2019-08-25 DIAGNOSIS — Z3202 Encounter for pregnancy test, result negative: Secondary | ICD-10-CM | POA: Diagnosis not present

## 2019-08-25 DIAGNOSIS — Z113 Encounter for screening for infections with a predominantly sexual mode of transmission: Secondary | ICD-10-CM | POA: Diagnosis not present

## 2019-08-25 DIAGNOSIS — Z3201 Encounter for pregnancy test, result positive: Secondary | ICD-10-CM | POA: Diagnosis not present

## 2019-08-25 LAB — POCT URINE PREGNANCY: Preg Test, Ur: POSITIVE — AB

## 2019-08-25 NOTE — Addendum Note (Signed)
Addended by: Federico Flake A on: 08/25/2019 10:11 AM   Modules accepted: Orders

## 2019-08-25 NOTE — Progress Notes (Addendum)
   NURSE VISIT- STD  SUBJECTIVE:  Jacqueline Barrera is a 24 y.o. [id number] GYN patientfemale here for a vaginal swab for STD screen.  She reports the following symptoms: none.   OBJECTIVE:  BP 139/76 (BP Location: Left Arm, Patient Position: Sitting, Cuff Size: Normal)   Pulse 99   Ht 5\' 4"  (1.626 m)   Wt 177 lb (80.3 kg)   LMP 07/21/2019 (Exact Date)   BMI 30.38 kg/m   Appears well, in no apparent distress  ASSESSMENT: Vaginal swab for STD screening  PLAN: Self-collected STD screening sent to lab Treatment: to be determined once results are received Follow-up as needed if symptoms persist/worsen, or new symptoms develop    NURSE VISIT- PREGNANCY CONFIRMATION   SUBJECTIVE:  Jacqueline Barrera is a 24 y.o. [id number] female at 5wkd by certain LMP of Patient's last menstrual period was 07/21/2019 (exact date). Here for pregnancy confirmation.  Home pregnancy test: not taken  She reports no complaints.  She is not taking prenatal vitamins.    OBJECTIVE:  BP 139/76 (BP Location: Left Arm, Patient Position: Sitting, Cuff Size: Normal)   Pulse 99   Ht 5\' 4"  (1.626 m)   Wt 177 lb (80.3 kg)   LMP 07/21/2019 (Exact Date)   BMI 30.38 kg/m   Appears well, in no apparent distress OB History  Gravida Para Term Preterm AB Living  4 2   2   1   SAB TAB Ectopic Multiple Live Births        0 1    # Outcome Date GA Lbr Len/2nd Weight Sex Delivery Anes PTL Lv  4 Current           3 Preterm 11/20/17 [redacted]w[redacted]d 07:10 / 00:21 6 lb 4.4 oz (2.845 kg) M Vag-Spont EPI  LIV  2 Preterm 02/04/17 [redacted]w[redacted]d 08:21 / 00:23 3 lb 2.6 oz (1.435 kg) F Vag-Spont EPI N FD     Complications: Severe pre-eclampsia, Placental abruption  1 Gravida             Results for orders placed or performed in visit on 08/25/19 (from the past 24 hour(s))  POCT urine pregnancy   Collection Time: 08/25/19 10:04 AM  Result Value Ref Range   Preg Test, Ur Positive (A) Negative    ASSESSMENT: Negative pregnancy test     PLAN: Schedule for dating ultrasound in 2  weeks Prenatal vitamins: plans to begin OTC ASAP   Nausea medicines: not currently needed   OB packet given: Yes Will route note jennifer griffin a [redacted]w[redacted]d Kesler Wickham  08/25/2019 10:04 AM   [place] Lozito 10/25/19  08/25/2019 10:04 AM

## 2019-08-26 LAB — CERVICOVAGINAL ANCILLARY ONLY
Chlamydia: NEGATIVE
Comment: NEGATIVE
Comment: NORMAL
Neisseria Gonorrhea: NEGATIVE

## 2019-09-05 ENCOUNTER — Other Ambulatory Visit: Payer: Self-pay

## 2019-09-05 ENCOUNTER — Emergency Department (HOSPITAL_COMMUNITY): Payer: Medicaid Other

## 2019-09-05 ENCOUNTER — Emergency Department (HOSPITAL_COMMUNITY)
Admission: EM | Admit: 2019-09-05 | Discharge: 2019-09-06 | Disposition: A | Discharge status: Home / Self Care | Payer: Medicaid Other | Attending: Emergency Medicine | Admitting: Emergency Medicine

## 2019-09-05 DIAGNOSIS — Z20822 Contact with and (suspected) exposure to covid-19: Secondary | ICD-10-CM | POA: Diagnosis not present

## 2019-09-05 DIAGNOSIS — R52 Pain, unspecified: Secondary | ICD-10-CM

## 2019-09-05 DIAGNOSIS — E876 Hypokalemia: Secondary | ICD-10-CM | POA: Diagnosis not present

## 2019-09-05 DIAGNOSIS — O26891 Other specified pregnancy related conditions, first trimester: Secondary | ICD-10-CM | POA: Diagnosis not present

## 2019-09-05 DIAGNOSIS — O99331 Smoking (tobacco) complicating pregnancy, first trimester: Secondary | ICD-10-CM | POA: Diagnosis not present

## 2019-09-05 DIAGNOSIS — O219 Vomiting of pregnancy, unspecified: Secondary | ICD-10-CM | POA: Diagnosis not present

## 2019-09-05 DIAGNOSIS — F17290 Nicotine dependence, other tobacco product, uncomplicated: Secondary | ICD-10-CM | POA: Insufficient documentation

## 2019-09-05 DIAGNOSIS — O99281 Endocrine, nutritional and metabolic diseases complicating pregnancy, first trimester: Secondary | ICD-10-CM | POA: Diagnosis not present

## 2019-09-05 DIAGNOSIS — R3912 Poor urinary stream: Secondary | ICD-10-CM | POA: Insufficient documentation

## 2019-09-05 DIAGNOSIS — Z79899 Other long term (current) drug therapy: Secondary | ICD-10-CM | POA: Diagnosis not present

## 2019-09-05 DIAGNOSIS — Z3A01 Less than 8 weeks gestation of pregnancy: Secondary | ICD-10-CM | POA: Diagnosis not present

## 2019-09-05 DIAGNOSIS — R102 Pelvic and perineal pain: Secondary | ICD-10-CM | POA: Diagnosis not present

## 2019-09-05 LAB — CBC WITH DIFFERENTIAL/PLATELET
Abs Immature Granulocytes: 0.01 10*3/uL (ref 0.00–0.07)
Basophils Absolute: 0 10*3/uL (ref 0.0–0.1)
Basophils Relative: 1 %
Eosinophils Absolute: 0 10*3/uL (ref 0.0–0.5)
Eosinophils Relative: 0 %
HCT: 41.6 % (ref 36.0–46.0)
Hemoglobin: 13.5 g/dL (ref 12.0–15.0)
Immature Granulocytes: 0 %
Lymphocytes Relative: 32 %
Lymphs Abs: 1.8 10*3/uL (ref 0.7–4.0)
MCH: 26.5 pg (ref 26.0–34.0)
MCHC: 32.5 g/dL (ref 30.0–36.0)
MCV: 81.7 fL (ref 80.0–100.0)
Monocytes Absolute: 0.5 10*3/uL (ref 0.1–1.0)
Monocytes Relative: 9 %
Neutro Abs: 3.2 10*3/uL (ref 1.7–7.7)
Neutrophils Relative %: 58 %
Platelets: 338 10*3/uL (ref 150–400)
RBC: 5.09 MIL/uL (ref 3.87–5.11)
RDW: 15.7 % — ABNORMAL HIGH (ref 11.5–15.5)
WBC: 5.5 10*3/uL (ref 4.0–10.5)
nRBC: 0 % (ref 0.0–0.2)

## 2019-09-05 LAB — URINALYSIS, ROUTINE W REFLEX MICROSCOPIC
Bacteria, UA: NONE SEEN
Bilirubin Urine: NEGATIVE
Glucose, UA: NEGATIVE mg/dL
Hgb urine dipstick: NEGATIVE
Ketones, ur: 80 mg/dL — AB
Nitrite: NEGATIVE
Protein, ur: 30 mg/dL — AB
Specific Gravity, Urine: 1.031 — ABNORMAL HIGH (ref 1.005–1.030)
pH: 6 (ref 5.0–8.0)

## 2019-09-05 LAB — COMPREHENSIVE METABOLIC PANEL
ALT: 87 U/L — ABNORMAL HIGH (ref 0–44)
AST: 54 U/L — ABNORMAL HIGH (ref 15–41)
Albumin: 4.5 g/dL (ref 3.5–5.0)
Alkaline Phosphatase: 68 U/L (ref 38–126)
Anion gap: 12 (ref 5–15)
BUN: 9 mg/dL (ref 6–20)
CO2: 25 mmol/L (ref 22–32)
Calcium: 9.1 mg/dL (ref 8.9–10.3)
Chloride: 97 mmol/L — ABNORMAL LOW (ref 98–111)
Creatinine, Ser: 0.73 mg/dL (ref 0.44–1.00)
GFR calc Af Amer: >60 mL/min (ref >60)
GFR calc non Af Amer: >60 mL/min (ref >60)
Glucose, Bld: 94 mg/dL (ref 70–99)
Potassium: 2.7 mmol/L — CL (ref 3.5–5.1)
Sodium: 134 mmol/L — ABNORMAL LOW (ref 135–145)
Total Bilirubin: 0.6 mg/dL (ref 0.3–1.2)
Total Protein: 8.3 g/dL — ABNORMAL HIGH (ref 6.5–8.1)

## 2019-09-05 LAB — WET PREP, GENITAL
Clue Cells Wet Prep HPF POC: NONE SEEN
Sperm: NONE SEEN
Trich, Wet Prep: NONE SEEN
Yeast Wet Prep HPF POC: NONE SEEN

## 2019-09-05 LAB — POC SARS CORONAVIRUS 2 AG -  ED: SARS Coronavirus 2 Ag: NEGATIVE

## 2019-09-05 LAB — LIPASE, BLOOD: Lipase: 18 U/L (ref 11–51)

## 2019-09-05 LAB — MAGNESIUM: Magnesium: 2.4 mg/dL (ref 1.7–2.4)

## 2019-09-05 LAB — HCG, QUANTITATIVE, PREGNANCY: hCG, Beta Chain, Quant, S: 101080 m[IU]/mL — ABNORMAL HIGH (ref <5)

## 2019-09-05 MED ORDER — POTASSIUM CHLORIDE CRYS ER 20 MEQ PO TBCR
40.0000 meq | EXTENDED_RELEASE_TABLET | Freq: Once | ORAL | Status: AC
  Administered 2019-09-05: 22:00:00 40 meq via ORAL
  Filled 2019-09-05: qty 2

## 2019-09-05 MED ORDER — SODIUM CHLORIDE 0.9 % IV BOLUS
1000.0000 mL | Freq: Once | INTRAVENOUS | Status: AC
  Administered 2019-09-05: 21:00:00 1000 mL via INTRAVENOUS

## 2019-09-05 MED ORDER — PROMETHAZINE HCL 25 MG/ML IJ SOLN
12.5000 mg | Freq: Once | INTRAMUSCULAR | Status: AC
  Administered 2019-09-05: 12.5 mg via INTRAVENOUS
  Filled 2019-09-05: qty 1

## 2019-09-05 MED ORDER — POTASSIUM CHLORIDE 10 MEQ/100ML IV SOLN
10.0000 meq | Freq: Once | INTRAVENOUS | Status: AC
  Administered 2019-09-05: 10 meq via INTRAVENOUS
  Filled 2019-09-05: qty 100

## 2019-09-05 NOTE — ED Notes (Signed)
Date and time results received: 09/05/19 21:58 (use smartphrase ".now" to insert current time)  Test: potassium Critical Value: 2.7  Name of Provider Notified: Lyndel Safe PA  Orders Received? Or Actions Taken?: see emr

## 2019-09-05 NOTE — ED Notes (Signed)
Pt to US.

## 2019-09-05 NOTE — ED Provider Notes (Signed)
[facility] EMERGENCY DEPARTMENT Provider Note   CSN: [id number] Arrival date & time: 09/05/19  1922     History Chief Complaint  Patient presents with  . Emesis    Jacqueline Barrera is a 24 y.o. female  [phone], [redacted]w[redacted]d who presents today for evaluation of nausea and vomiting for the past 5 days.  She reports that she has been unable to tolerate any p.o. intake and is intermittently had a headache.  She states she has had decreased urinary output secondary to poor p.o. intake.  She reports that she has not been having large bowel movements.  She has pain primarily in the left lower abdominal quadrant.  She denies any fevers.  No vaginal spotting or bleeding.  She has not yet had an ultrasound this pregnancy.  She has had a previous IUFD, in third trimester, from severe preeclampsia.  She reports she has intermittently had headache since Tuesday.  She denies any vision changes.  She denies any fevers at home.    HPI     Past Medical History:  Diagnosis Date  . Anxiety   . Scoliosis     Patient Active Problem List   Diagnosis Date Noted  . History of preterm delivery 12/25/2017  . History of IUFD 06/10/2017  . H/O preeclampsia 06/10/2017  . Marijuana use 08/28/2016    Past Surgical History:  Procedure Laterality Date  . ADENOIDECTOMY    . TONSILLECTOMY AND ADENOIDECTOMY    . WISDOM TOOTH EXTRACTION       OB History    Gravida  4   Para  2   Term      Preterm  2   AB      Living  1     SAB      TAB      Ectopic      Multiple  0   Live Births  1           Family History  Problem Relation Age of Onset  . Healthy Mother   . Healthy Father   . Hyperlipidemia Maternal Aunt   . Hypertension Maternal Grandmother     Social History   Tobacco Use  . Smoking status: Current Every Day Smoker    Years: 3.00    Types: Cigars    Last attempt to quit: 02/03/2017    Years since quitting: 2.5  . Smokeless tobacco: Never Used  . Tobacco comment:  smokes 1 cigar daily  Substance Use Topics  . Alcohol use: No    Alcohol/week: 35.0 standard drinks    Types: 35 Shots of liquor per week    Comment: before pregnancy  . Drug use: Yes    Frequency: 7.0 times per week    Types: Marijuana    Comment: pt smokes to eat; once a day    Home Medications Prior to Admission medications   Medication Sig Start Date End Date Taking? Authorizing Provider  Prenatal Vit-Fe Fumarate-FA (MULTIVITAMIN-PRENATAL) 27-0.8 MG TABS tablet Take 2 tablets by mouth daily at 12 noon. Gummy Prenatal Vitamins   Yes [provider]  potassium chloride 20 MEQ TBCR Take 20 mEq by mouth daily for 5 days. 09/06/19 09/11/19  Cristina Gong, PA-C  promethazine (PHENERGAN) 25 MG tablet Take 0.5-1 tablets (12.5-25 mg total) by mouth every 6 (six) hours as needed for nausea or vomiting. 09/06/19   Cristina Gong, PA-C    Allergies    Other  Review of Systems   Review  of Systems  Constitutional: Negative for chills and fever.  Respiratory: Negative for chest tightness and shortness of breath.   Cardiovascular: Negative for chest pain.  Gastrointestinal: Positive for abdominal pain, nausea and vomiting. Negative for diarrhea.  Genitourinary: Negative for dysuria, vaginal bleeding, vaginal discharge and vaginal pain.  Musculoskeletal: Negative for back pain.  Skin: Negative for color change and rash.  Neurological: Positive for weakness, light-headedness and headaches.  All other systems reviewed and are negative.   Physical Exam Updated Vital Signs BP 124/80   Pulse 87   Temp 98.8 F (37.1 C) (Oral)   Resp 16   Ht 5\' 4"  (1.626 m)   Wt 79.8 kg   LMP 07/21/2019 (Exact Date)   SpO2 100%   BMI 30.21 kg/m   Physical Exam Vitals and nursing note reviewed. Exam conducted with a chaperone present (Patient's primary RN).  Constitutional:      General: She is not in acute distress.    Appearance: She is well-developed. She is obese. She is not  ill-appearing or diaphoretic.  HENT:     Head: Normocephalic and atraumatic.     Right Ear: External ear normal.     Left Ear: External ear normal.     Nose: Nose normal.  Eyes:     General: No scleral icterus.       Right eye: No discharge.        Left eye: No discharge.     Conjunctiva/sclera: Conjunctivae normal.     Pupils: Pupils are equal, round, and reactive to light.  Neck:     Trachea: No tracheal deviation.  Cardiovascular:     Rate and Rhythm: Regular rhythm. Tachycardia present.     Heart sounds: Normal heart sounds. No murmur. No friction rub. No gallop.   Pulmonary:     Effort: Pulmonary effort is normal. No respiratory distress.     Breath sounds: Normal breath sounds. No stridor. No wheezing.  Abdominal:     General: Bowel sounds are normal.     Palpations: Abdomen is soft.     Tenderness: There is abdominal tenderness (LLQ). There is no guarding.  Genitourinary:    Comments: Normal external female genitalia.  There is a moderate amount of creamy white discharge in the vaginal canal without spotting or bleeding.  Cervical os is closed.  There is diffuse pain with palpation without specific cervical motion tenderness. Musculoskeletal:        General: No deformity.     Cervical back: Normal range of motion and neck supple.     Right lower leg: No edema.     Left lower leg: No edema.  Skin:    General: Skin is warm and dry.  Neurological:     Mental Status: She is alert.     Cranial Nerves: No cranial nerve deficit.     Sensory: No sensory deficit.     Motor: No abnormal muscle tone.  Psychiatric:        Mood and Affect: Mood normal.        Behavior: Behavior normal.     ED Results / Procedures / Treatments   Labs (all labs ordered are listed, but only abnormal results are displayed) Labs Reviewed  WET PREP, GENITAL - Abnormal; Notable for the following components:      Result Value   WBC, Wet Prep HPF POC MANY (*)    All other components within normal  limits  COMPREHENSIVE METABOLIC PANEL - Abnormal; Notable for the following  components:   Sodium 134 (*)    Potassium 2.7 (*)    Chloride 97 (*)    Total Protein 8.3 (*)    AST 54 (*)    ALT 87 (*)    All other components within normal limits  CBC WITH DIFFERENTIAL/PLATELET - Abnormal; Notable for the following components:   RDW 15.7 (*)    All other components within normal limits  HCG, QUANTITATIVE, PREGNANCY - Abnormal; Notable for the following components:   hCG, Beta Chain, Quant, S 101,080 (*)    All other components within normal limits  URINALYSIS, ROUTINE W REFLEX MICROSCOPIC - Abnormal; Notable for the following components:   Color, Urine AMBER (*)    APPearance CLOUDY (*)    Specific Gravity, Urine 1.031 (*)    Ketones, ur 80 (*)    Protein, ur 30 (*)    Leukocytes,Ua SMALL (*)    All other components within normal limits  RAPID URINE DRUG SCREEN, HOSP PERFORMED - Abnormal; Notable for the following components:   Tetrahydrocannabinol POSITIVE (*)    All other components within normal limits  RESPIRATORY PANEL BY RT PCR (FLU A&B, COVID)  URINE CULTURE  LIPASE, BLOOD  MAGNESIUM  POC SARS CORONAVIRUS 2 AG -  ED  GC/CHLAMYDIA PROBE AMP ([place]) NOT AT [facility]    EKG None  Radiology US OB Comp Less 14 Wks  Result Date: 09/05/2019 CLINICAL DATA:  Pelvic pain EXAM: OBSTETRIC <14 WK ULTRASOUND TECHNIQUE: Transabdominal ultrasound was performed for evaluation of the gestation as well as the maternal uterus and adnexal regions. COMPARISON:  None. FINDINGS: Intrauterine gestational sac: Single Yolk sac:  Visualized Embryo:  Visualized Cardiac Activity: Visualized Heart Rate: 125 bpm MSD:    mm    w     d CRL:   10.2 mm   7 w 1 d                  [place] EDC: 04/23/2020 Subchorionic hemorrhage:  None visualized. Maternal uterus/adnexae: No adnexal mass or free fluid. IMPRESSION: Seven week 1 day intrauterine pregnancy. Fetal heart rate 125 beats per minute. No acute maternal  findings. Electronically Signed   By: Charlett Nose M.D.   On: 09/05/2019 23:42    Procedures Procedures (including critical care time)  Medications Ordered in ED Medications  sodium chloride 0.9 % bolus 1,000 mL (0 mLs Intravenous Stopped 09/05/19 2254)  potassium chloride SA (KLOR-CON) CR tablet 40 mEq (40 mEq Oral Given 09/05/19 2217)  potassium chloride 10 mEq in 100 mL IVPB (0 mEq Intravenous Stopped 09/06/19 0026)  promethazine (PHENERGAN) injection 12.5 mg (12.5 mg Intravenous Given 09/05/19 2215)    ED Course  I have reviewed the triage vital signs and the nursing notes.  Pertinent labs & imaging results that were available during my care of the patient were reviewed by me and considered in my medical decision making (see chart for details).  Clinical Course as of Sep 06 123  Mon Sep 06, 2019  0023 I spoke with Dr. Gomez Cleverly of OB/GYN, he recommends PO challenging patient. If she passes can discharge with PO potassium and PO antiemetics.     [EH]    Clinical Course User Index [EH] Norman Clay   MDM Rules/Calculators/A&P                     Patient is a 24 year old [phone] [redacted]w[redacted]d by LMP who presents today for evaluation of nausea, vomiting, and inability to  tolerate p.o. intake.   CBC without significant acute abnormalities.  CMP shows hypokalemia with a potassium of 2.7, mild transaminitis with AST of 54 and ALT of 87.  IV and p.o. replacement is ordered.  She is given IV fluids.  Lipase and magnesium are not elevated.  UA was sent with 80 ketones and no bacteria seen, urine culture is sent.  Covid antigen test is negative.  Covid PCR test and flu test are negative.  Here she is afebrile not tachycardic or tachypneic.  I spoke with Dr. Vergie Living on-call for OB/GYN who recommended p.o. challenging patient, if she tolerates p.o. challenge can discharge with p.o. potassium and antiemetics and close outpatient follow-up.  She was able to pass liquid p.o. challenge.  She is  established with Dr. Despina Hidden.  Recommended that she contact his office in the morning for close outpatient follow-up.  And I sent him an epic message to help facilitate close follow up. She is given a prescription for Phenergan and p.o. potassium. She is also instructed on vitamin B6 and Unisom as a digelcysis equivalent for symptomatic relief. With instructions to take the phenergan only if needed and B6/unisom fails.   Ultrasound was obtained showing single live intrauterine gestation without evidence of ectopic pregnancy.  UDS is positive for cannabis.  Question if this may be contributing to her hyperemesis.  Return precautions were discussed with patient who states their understanding.  At the time of discharge patient denied any unaddressed complaints or concerns.  Patient is agreeable for discharge home.  Note: Portions of this report may have been transcribed using voice recognition software. Every effort was made to ensure accuracy; however, inadvertent computerized transcription errors may be present  Final Clinical Impression(s) / ED Diagnoses Final diagnoses:  Vomiting pregnancy  Hypokalemia due to excessive gastrointestinal loss of potassium    Rx / DC Orders ED Discharge Orders         Ordered    promethazine (PHENERGAN) 25 MG tablet  Every 6 hours PRN     09/06/19 0122    potassium chloride 20 MEQ TBCR  Daily     09/06/19 0122           Cristina Gong, PA-C 09/06/19 0130    Terald Sleeper, MD 09/06/19 1236

## 2019-09-05 NOTE — ED Triage Notes (Signed)
Followed by [facility]   third pregnancy   N/V since Tuesday   No call to [facility]  Now vomiting and after call to [facility] hospital has been advised to "go to the Emergency room for fluids"  Activly vomiting in Triage

## 2019-09-06 LAB — RAPID URINE DRUG SCREEN, HOSP PERFORMED
Amphetamines: NOT DETECTED
Barbiturates: NOT DETECTED
Benzodiazepines: NOT DETECTED
Cocaine: NOT DETECTED
Opiates: NOT DETECTED
Tetrahydrocannabinol: POSITIVE — AB

## 2019-09-06 LAB — RESPIRATORY PANEL BY RT PCR (FLU A&B, COVID)
Influenza A by PCR: NEGATIVE
Influenza B by PCR: NEGATIVE
SARS Coronavirus 2 by RT PCR: NEGATIVE

## 2019-09-06 MED ORDER — PROMETHAZINE HCL 25 MG PO TABS: 12.5000 mg | ORAL_TABLET | Freq: Four times a day (QID) | ORAL | 0 refills | Status: DC | PRN

## 2019-09-06 MED ORDER — POTASSIUM CHLORIDE ER 20 MEQ PO TBCR: 20.0000 meq | EXTENDED_RELEASE_TABLET | Freq: Every day | ORAL | 0 refills | Status: DC

## 2019-09-06 NOTE — Discharge Instructions (Signed)
For morning sickness and nausea and vomiting in pregnancy you may take Diclegis.  This is an expensive prescription.  The active ingredients are the same as taking unisom or a similar brand (Doxylamine succinate) and vitamin B6 (Pyridoxine hydrochloride).  Please make sure the active ingredient matches the same name as there are many types of unisom and generic medications.  You may take 12.5 of unisom (or generic) every 4-6 hours, if needed you can take up to 25mg .  Please do not take more than 75mg  in 24 hours.   For the vitamin B6 (pyridoxine) you may take 10mg  up to twice a day.   Today you received medications that may make you sleepy or impair your ability to make decisions.  For the next 24 hours please do not drive, operate heavy machinery, care for a small child with out another adult present, or perform any activities that may cause harm to you or someone else if you were to fall asleep or be impaired.   You are being prescribed a medication which may make you sleepy. Please follow up of listed precautions for at least 24 hours after taking one dose.

## 2019-09-07 ENCOUNTER — Other Ambulatory Visit: Payer: Self-pay | Admitting: Obstetrics & Gynecology

## 2019-09-07 ENCOUNTER — Other Ambulatory Visit: Payer: Self-pay | Admitting: Radiology

## 2019-09-07 DIAGNOSIS — O3680X0 Pregnancy with inconclusive fetal viability, not applicable or unspecified: Secondary | ICD-10-CM

## 2019-09-07 LAB — GC/CHLAMYDIA PROBE AMP (~~LOC~~) NOT AT ARMC
Chlamydia: NEGATIVE
Comment: NEGATIVE
Comment: NORMAL
Neisseria Gonorrhea: NEGATIVE

## 2019-09-07 LAB — URINE CULTURE

## 2019-09-08 ENCOUNTER — Telehealth: Payer: Self-pay | Admitting: *Deleted

## 2019-09-08 ENCOUNTER — Other Ambulatory Visit: Payer: Medicaid Other

## 2019-09-08 MED ORDER — PROMETHAZINE HCL 25 MG PO TABS: 25.0000 mg | ORAL_TABLET | Freq: Four times a day (QID) | ORAL | 1 refills | Status: DC | PRN

## 2019-09-08 NOTE — Telephone Encounter (Signed)
Refilled phenergan

## 2019-09-08 NOTE — Telephone Encounter (Signed)
Patient states she was seen in the ER recently for vomiting during pregnancy. She was prescribed Phenergan but was only given 10 tablets.  She is requesting a refill. Advised to check with pharmacy later today

## 2019-09-08 NOTE — Addendum Note (Signed)
Addended by: [name] A on: 09/08/2019 06:13 PM   Modules accepted: Orders

## 2019-09-16 ENCOUNTER — Ambulatory Visit: Payer: Medicaid Other | Admitting: Obstetrics & Gynecology

## 2019-10-12 ENCOUNTER — Other Ambulatory Visit: Payer: Medicaid Other

## 2019-10-12 ENCOUNTER — Ambulatory Visit: Payer: Medicaid Other | Admitting: *Deleted

## 2019-10-14 ENCOUNTER — Other Ambulatory Visit: Payer: Medicaid Other

## 2019-10-14 ENCOUNTER — Encounter: Payer: Medicaid Other | Admitting: Advanced Practice Midwife

## 2019-10-14 ENCOUNTER — Ambulatory Visit: Payer: Medicaid Other | Admitting: *Deleted

## 2019-10-26 ENCOUNTER — Other Ambulatory Visit: Payer: Self-pay | Admitting: Obstetrics and Gynecology

## 2020-01-04 ENCOUNTER — Other Ambulatory Visit: Payer: Self-pay

## 2020-01-04 ENCOUNTER — Ambulatory Visit
Admission: EM | Admit: 2020-01-04 | Discharge: 2020-01-04 | Disposition: A | Discharge status: Home / Self Care | Payer: Medicaid Other | Attending: Emergency Medicine | Admitting: Emergency Medicine

## 2020-01-04 DIAGNOSIS — Z1152 Encounter for screening for COVID-19: Secondary | ICD-10-CM | POA: Diagnosis not present

## 2020-01-04 NOTE — ED Triage Notes (Signed)
Pt here for covid test after exposure, no symptoms  

## 2020-01-05 LAB — SARS-COV-2, NAA 2 DAY TAT

## 2020-01-05 LAB — NOVEL CORONAVIRUS, NAA: SARS-CoV-2, NAA: NOT DETECTED

## 2020-01-19 ENCOUNTER — Other Ambulatory Visit (HOSPITAL_COMMUNITY)
Admission: RE | Admit: 2020-01-19 | Discharge: 2020-01-19 | Disposition: A | Discharge status: Home / Self Care | Payer: Medicaid Other | Source: Ambulatory Visit | Attending: Adult Health | Admitting: Adult Health

## 2020-01-19 ENCOUNTER — Encounter: Payer: Self-pay | Admitting: Adult Health

## 2020-01-19 ENCOUNTER — Ambulatory Visit (INDEPENDENT_AMBULATORY_CARE_PROVIDER_SITE_OTHER): Payer: Medicaid Other | Admitting: Adult Health

## 2020-01-19 VITALS — BP 142/78 | HR 99 | Ht 64.0 in | Wt 186.0 lb

## 2020-01-19 DIAGNOSIS — Z113 Encounter for screening for infections with a predominantly sexual mode of transmission: Secondary | ICD-10-CM | POA: Diagnosis not present

## 2020-01-19 DIAGNOSIS — Z30011 Encounter for initial prescription of contraceptive pills: Secondary | ICD-10-CM | POA: Diagnosis not present

## 2020-01-19 DIAGNOSIS — N939 Abnormal uterine and vaginal bleeding, unspecified: Secondary | ICD-10-CM | POA: Diagnosis not present

## 2020-01-19 DIAGNOSIS — Z3202 Encounter for pregnancy test, result negative: Secondary | ICD-10-CM

## 2020-01-19 LAB — POCT URINE PREGNANCY: Preg Test, Ur: NEGATIVE

## 2020-01-19 MED ORDER — NORETHIN ACE-ETH ESTRAD-FE 1-20 MG-MCG PO TABS: 1.0000 | ORAL_TABLET | Freq: Every day | ORAL | 11 refills | Status: DC

## 2020-01-19 NOTE — Progress Notes (Signed)
  Subjective:     Patient ID: Jacqueline Barrera, female   DOB: 1996/03/02, 24 y.o.   MRN: [id number]  HPI [name] is a 24 year old black female,single, [id number], in complaining of bleeding on depo, got shot in June, bleed 23 days in July and has been bleeding since August 5, wants to get on the pill, has not had sex in a while. She had normal pap 06/01/2018. PCP is Dr Earlene Plater.  Review of Systems Bleeding on depo Reviewed past medical,surgical, social and family history. Reviewed medications and allergies.     Objective:   Physical Exam BP (!) 142/78 (BP Location: Left Arm, Patient Position: Sitting, Cuff Size: Normal)   Pulse 99   Ht 5\' 4"  (1.626 m)   Wt 186 lb (84.4 kg)   LMP 11/22/2019   Breastfeeding No   BMI 31.93 kg/m UPT is negative. Skin warm and dry.Pelvic: external genitalia is normal in appearance no lesions, vagina: pink discharge with odor,onions and garlic,urethra has no lesions or masses noted, cervix:smooth and bulbous, uterus: normal size, shape and contour, non tender, no masses felt, adnexa: no masses or tenderness noted. Bladder is non tender and no masses felt. CV swab obtained Examination chaperoned by 01/23/2020 LPN   Fall risk is low  Upstream - 01/19/20 1441      Pregnancy Intention Screening   Does the patient want to become pregnant in the next year? No    Does the patient's partner want to become pregnant in the next year? No    Would the patient like to discuss contraceptive options today? Yes      Contraception Wrap Up   Current Method Hormonal Injection    End Method Oral Contraceptive          Assessment:     1. Pregnancy examination or test, negative result   2. Abnormal uterine bleeding (AUB) Will stop depo and start OCs CV swab sent   3. Encounter for initial prescription of contraceptive pills Will rx junel 1/20 start Sunday and use condoms Meds ordered this encounter  Medications  . DISCONTD: norethindrone-ethinyl estradiol (LOESTRIN  FE) 1-20 MG-MCG tablet    Sig: Take 1 tablet by mouth daily.    Dispense:  28 tablet    Refill:  11    Order Specific Question:   Supervising Provider    Answer:   Saturday, LUTHER H [[id number]]  . norethindrone-ethinyl estradiol (LOESTRIN FE) 1-20 MG-MCG tablet    Sig: Take 1 tablet by mouth daily.    Dispense:  28 tablet    Refill:  11    Order Specific Question:   Supervising Provider    Answer:   Despina Hidden, LUTHER H [[id number]]    4. Screening examination for STD (sexually transmitted disease) Check HIV, RPR and hepatitis C antibody     Plan:     Return in about 5 months for physical.

## 2020-01-20 LAB — HEPATITIS C ANTIBODY: Hep C Virus Ab: <0.1 s/co ratio (ref 0.0–0.9)

## 2020-01-20 LAB — HIV ANTIBODY (ROUTINE TESTING W REFLEX): HIV Screen 4th Generation wRfx: NONREACTIVE

## 2020-01-20 LAB — RPR: RPR Ser Ql: NONREACTIVE

## 2020-01-21 LAB — CERVICOVAGINAL ANCILLARY ONLY
Bacterial Vaginitis (gardnerella): POSITIVE — AB
Candida Glabrata: NEGATIVE
Candida Vaginitis: NEGATIVE
Chlamydia: NEGATIVE
Comment: NEGATIVE
Comment: NEGATIVE
Comment: NEGATIVE
Comment: NEGATIVE
Comment: NEGATIVE
Comment: NORMAL
Neisseria Gonorrhea: NEGATIVE
Trichomonas: NEGATIVE

## 2020-01-25 ENCOUNTER — Other Ambulatory Visit: Payer: Self-pay | Admitting: Adult Health

## 2020-01-25 MED ORDER — METRONIDAZOLE 500 MG PO TABS: 500.0000 mg | ORAL_TABLET | Freq: Two times a day (BID) | ORAL | 0 refills | Status: DC

## 2020-01-25 NOTE — Progress Notes (Signed)
+  BV on CV swab will rx flagyl  

## 2020-02-08 IMAGING — CT CT TEMPORAL BONES W/O CM
3 of 8 series · 13 of 40 positions shown, 16 images · non-contrast
Comparison: None.

CLINICAL DATA: Right ear drainage since February 15, 2019. Partial
hearing loss.

EXAM:
CT TEMPORAL BONES WITHOUT CONTRAST
TECHNIQUE: Axial and coronal plane CT imaging of the petrous temporal bones was
performed with thin-collimation image reconstruction. No intravenous
contrast was administered. Multiplanar CT image reconstructions were
also generated.

[Series 6: cor mag right · coronal · 0.19mm/px · 1 of 173 slices shown]
[im 87/173  bone]
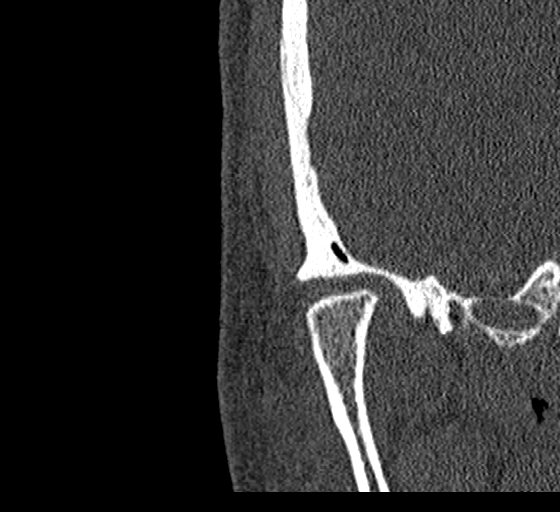

[Series 7: ax mag left · axial · 0.20mm/px · z∈[+1525,+1578]mm · 9 of 112 slices shown, 12 images]
[im 12/112  brain]
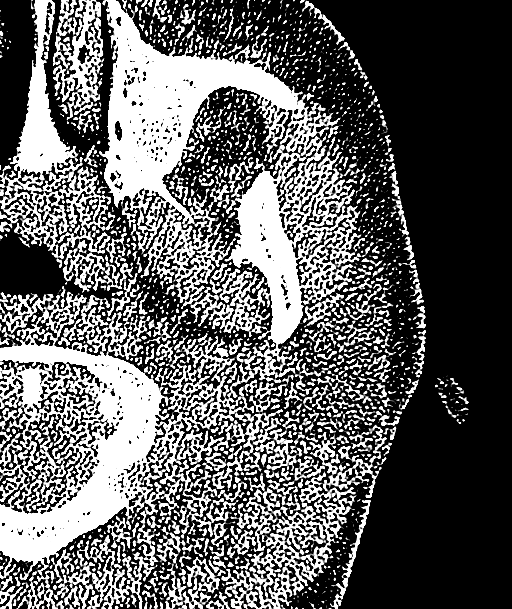
[im 12/112  bone]
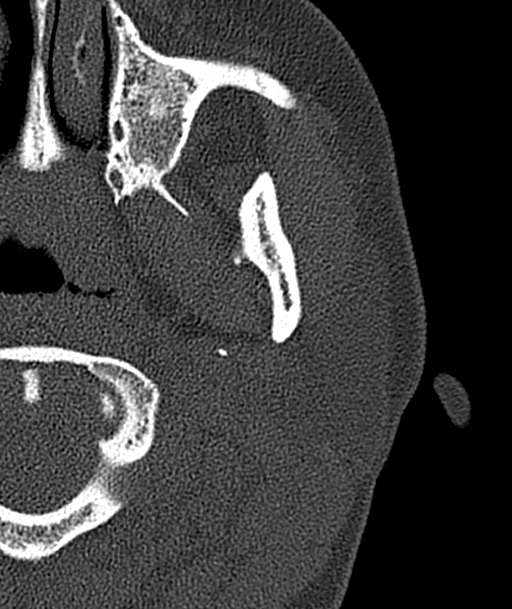
[im 23/112  bone]
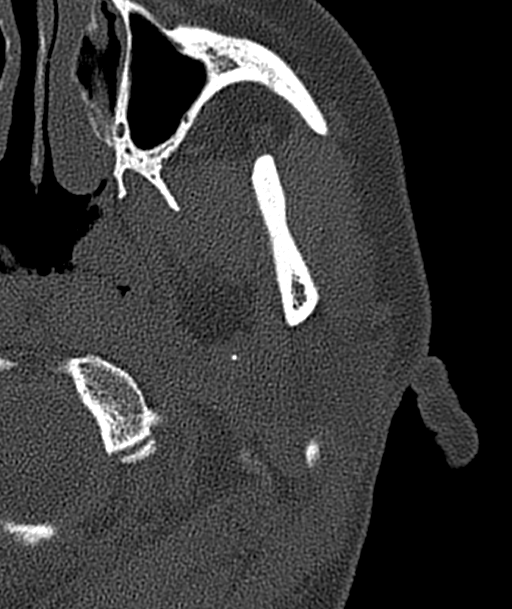
[im 34/112  bone]
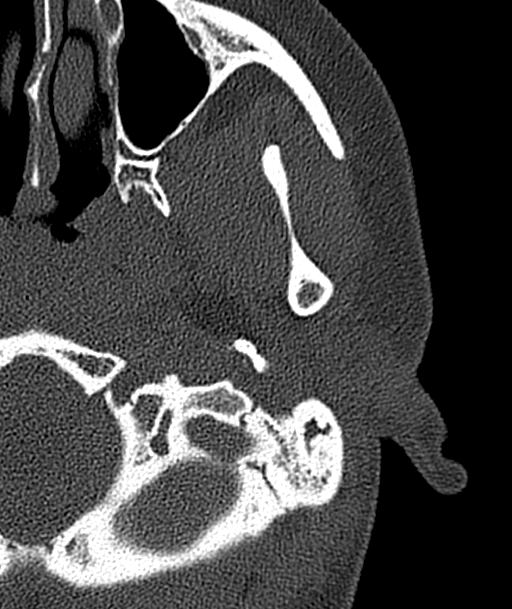
[im 45/112  bone]
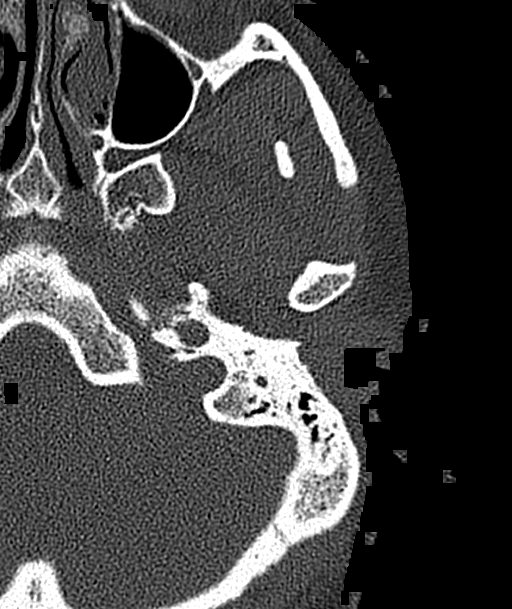
[im 56/112  brain]
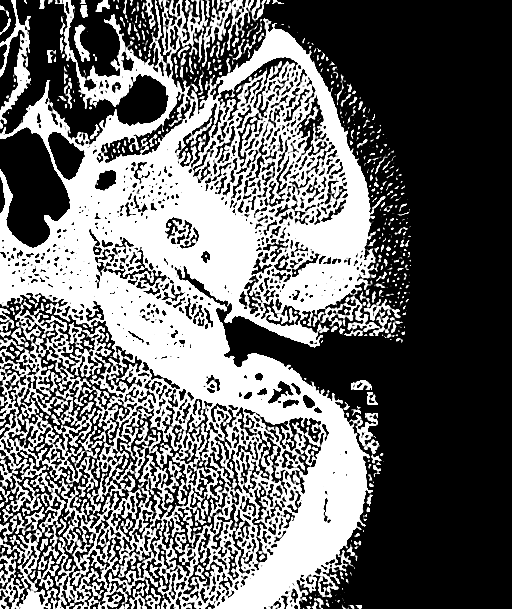
[im 56/112  bone]
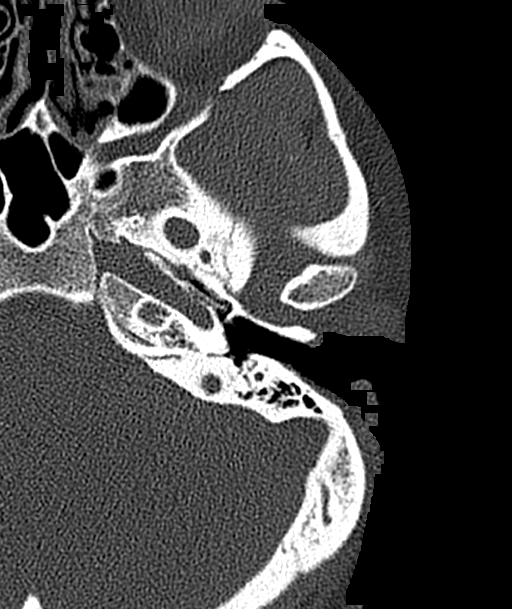
[im 67/112  bone]
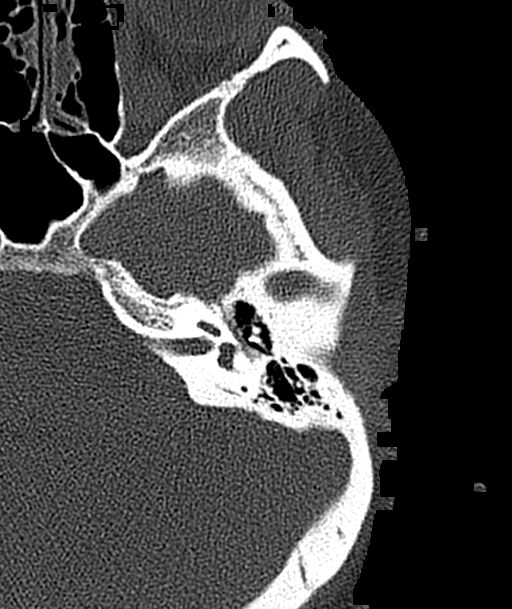
[im 78/112  bone]
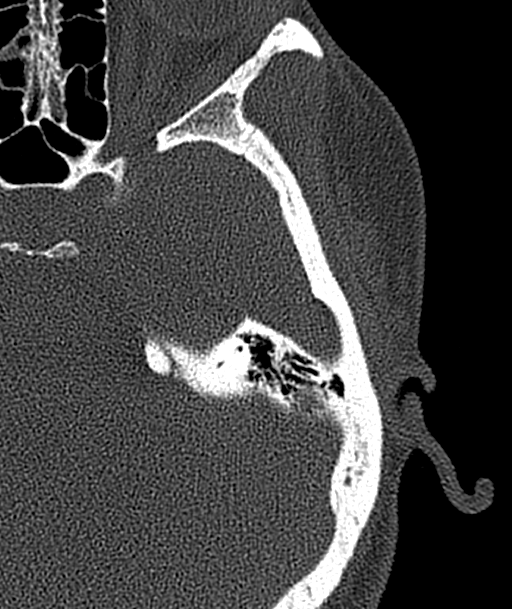
[im 89/112  bone]
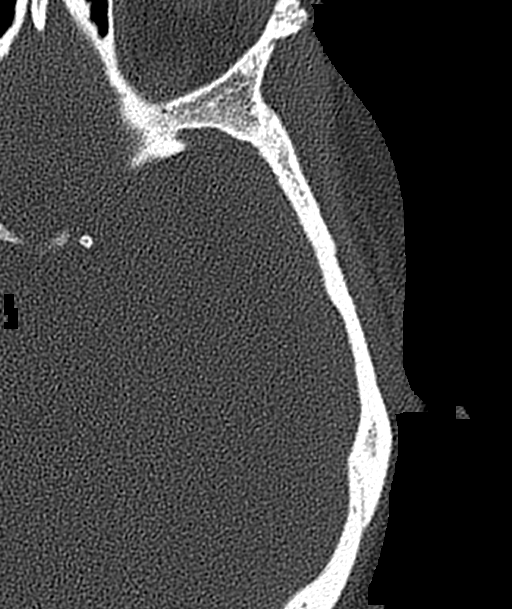
[im 100/112  brain]
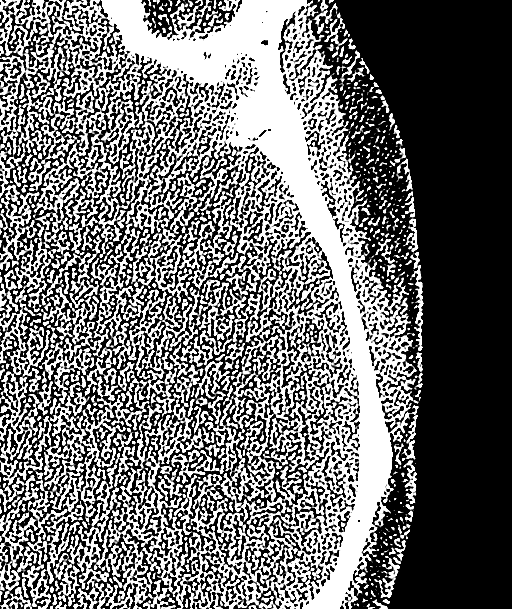
[im 100/112  bone]
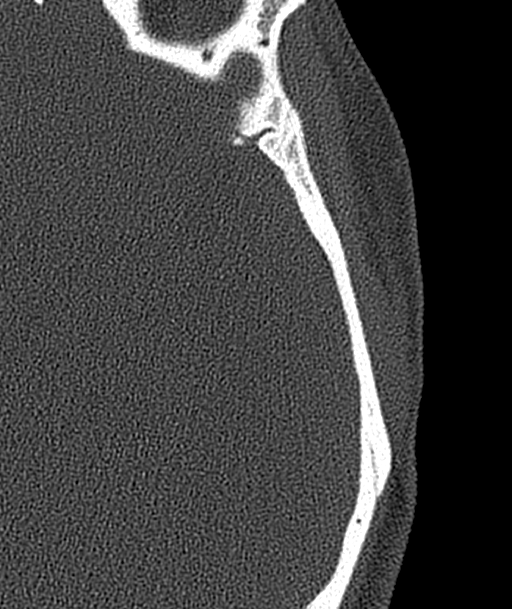

[Series 11: ax soft · axial · 0.30mm/px · z∈[+1541,+1589]mm · 3 of 48 slices shown]
[im 12/48  brain]
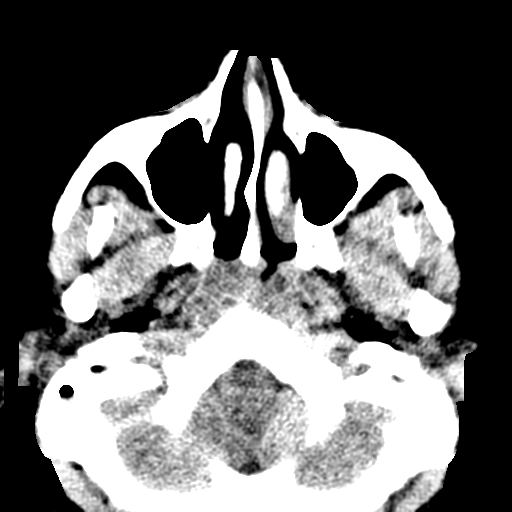
[im 24/48  brain]
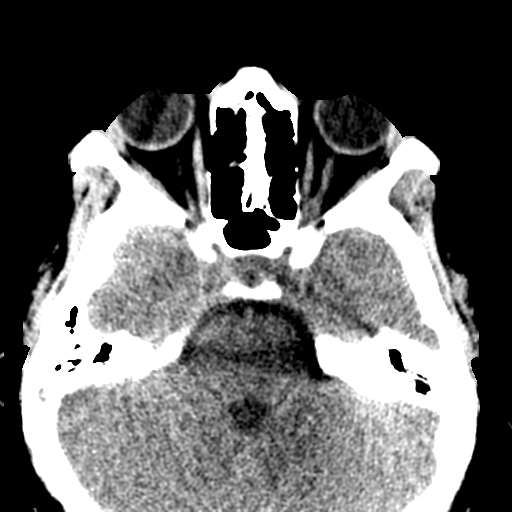
[im 36/48  brain]
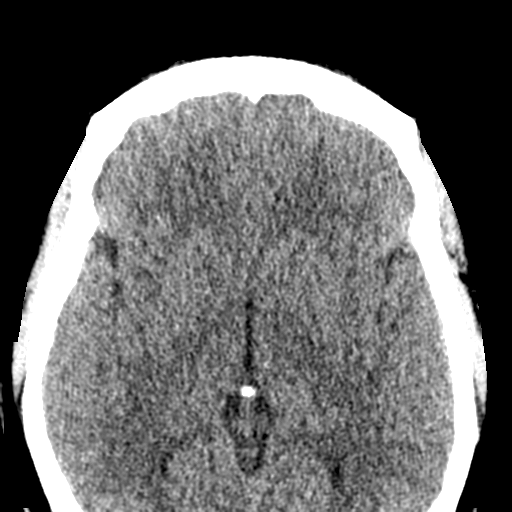

[13 of 40 positions shown; findings below may reference images not displayed]

FINDINGS: RIGHT:

--Pinna and external auditory canal: Normal.

--Ossicular chain: Normal. No erosion or dislocation.

--Tympanic membrane: Normal.

--Middle ear: Normal.

--Epitympanum: The Prussak space is clear. The scutum is sharp.
Tegmen tympani is intact.

--Cochlea, vestibule, vestibular aqueduct and semicircular canals:
Normal. No evidence of canal dehiscence or otospongiosis.

--Internal auditory canal: Normal. No widening of the porus
acusticus.

--Facial nerve: No focal abnormality along the course of the facial
nerve.

--Cerebellopontine angle: Normal.

--Petrous Apex: Normal.

--Mastoids: Small amount of fluid in the inferior right mastoid. No
coalescence.

--Carotid canal: Normal position.

LEFT:

--Pinna and external auditory canal: Normal.

--Ossicular chain: Normal. No erosion or dislocation.

--Tympanic membrane: Normal.

--Middle ear: Normal.

--Epitympanum: The Prussak space is clear. The scutum is sharp.
Tegmen tympani is intact.

--Cochlea, vestibule, vestibular aqueduct and semicircular canals:
Normal. No evidence of canal dehiscence or otospongiosis.

--Internal auditory canal: Normal. No widening of the porus
acusticus.

--Facial nerve: No focal abnormality along the course of the facial
nerve.

--Cerebellopontine angle: Normal.

--Petrous Apex: Normal.

--Mastoids: Normal.

--Carotid canal: Normal position.

OTHER:

--Visualized intracranial: Normal.

--Visualized paranasal sinuses: Normal.

--Nasopharynx: Clear.

--Temporomandibular joints: Normal.

--Visualized extracranial soft tissues: Normal.
IMPRESSION: Small amount of right mastoid fluid but otherwise normal CT of the
temporal bones.

## 2020-04-06 ENCOUNTER — Other Ambulatory Visit: Payer: Self-pay

## 2020-04-06 ENCOUNTER — Encounter: Payer: Self-pay | Admitting: Adult Health

## 2020-04-06 ENCOUNTER — Other Ambulatory Visit (HOSPITAL_COMMUNITY)
Admission: RE | Admit: 2020-04-06 | Discharge: 2020-04-06 | Disposition: A | Discharge status: Home / Self Care | Payer: Medicaid Other | Source: Ambulatory Visit | Attending: Adult Health | Admitting: Adult Health

## 2020-04-06 ENCOUNTER — Ambulatory Visit (INDEPENDENT_AMBULATORY_CARE_PROVIDER_SITE_OTHER): Payer: Medicaid Other | Admitting: Adult Health

## 2020-04-06 VITALS — BP 136/79 | HR 99 | Ht 64.0 in | Wt 195.0 lb

## 2020-04-06 DIAGNOSIS — Z113 Encounter for screening for infections with a predominantly sexual mode of transmission: Secondary | ICD-10-CM | POA: Insufficient documentation

## 2020-04-06 DIAGNOSIS — Z3202 Encounter for pregnancy test, result negative: Secondary | ICD-10-CM | POA: Diagnosis not present

## 2020-04-06 DIAGNOSIS — R110 Nausea: Secondary | ICD-10-CM | POA: Diagnosis not present

## 2020-04-06 LAB — POCT URINE PREGNANCY: Preg Test, Ur: NEGATIVE

## 2020-04-06 NOTE — Progress Notes (Signed)
  Subjective:     Patient ID: Jacqueline Barrera, female   DOB: January 27, 1996, 24 y.o.   MRN: [id number]  HPI Jacqueline Barrera is a 24 year old black female,[id number] in requesting UPT has nausea in am, is on OCs and requests STD testing.  PCP is Dr Earlene Plater.   Review of Systems Has nausea in am better after eating Denies any discharge, itching or burning  Reviewed past medical,surgical, social and family history. Reviewed medications and allergies.     Objective:   Physical Exam BP 136/79 (BP Location: Left Arm, Patient Position: Sitting, Cuff Size: Normal)   Pulse 99   Ht 5\' 4"  (1.626 m)   Wt 195 lb (88.5 kg)   LMP 03/06/2020   BMI 33.47 kg/m UPT is negative. Skin warm and dry.Pelvic: external genitalia is normal in appearance no lesions, vagina: white discharge without odor,urethra has no lesions or masses noted, cervix:smooth and bulbous, uterus: normal size, shape and contour, non tender, no masses felt, adnexa: no masses or tenderness noted. Bladder is non tender and no masses felt. CV swab obtained. Fall risk is low   Upstream - 04/06/20 1204      Pregnancy Intention Screening   Does the patient want to become pregnant in the next year? No    Does the patient's partner want to become pregnant in the next year? No    Would the patient like to discuss contraceptive options today? No      Contraception Wrap Up   Current Method Oral Contraceptive    End Method Oral Contraceptive    Contraception Counseling Provided No         Examination chaperoned by 04/08/20 LPN    Assessment:     1. Pregnancy examination or test, negative result   2. Screening examination for STD (sexually transmitted disease) V swab sent for GC/CHL and trich  3. Nausea Eat a  Peanut butter cracker or have some milk before bed    Plan:     Follow up prn

## 2020-04-07 LAB — CERVICOVAGINAL ANCILLARY ONLY
Chlamydia: NEGATIVE
Comment: NEGATIVE
Comment: NEGATIVE
Comment: NORMAL
Neisseria Gonorrhea: NEGATIVE
Trichomonas: NEGATIVE

## 2020-04-12 ENCOUNTER — Other Ambulatory Visit: Payer: Self-pay

## 2020-04-12 ENCOUNTER — Other Ambulatory Visit (HOSPITAL_COMMUNITY)
Admission: RE | Admit: 2020-04-12 | Discharge: 2020-04-12 | Disposition: A | Discharge status: Home / Self Care | Payer: Medicaid Other | Source: Ambulatory Visit | Attending: Obstetrics & Gynecology | Admitting: Obstetrics & Gynecology

## 2020-04-12 ENCOUNTER — Other Ambulatory Visit (INDEPENDENT_AMBULATORY_CARE_PROVIDER_SITE_OTHER): Payer: Medicaid Other

## 2020-04-12 DIAGNOSIS — Z113 Encounter for screening for infections with a predominantly sexual mode of transmission: Secondary | ICD-10-CM | POA: Insufficient documentation

## 2020-04-12 NOTE — Progress Notes (Signed)
Chart reviewed for nurse visit. Agree with plan of care.  Adline Potter, NP 04/12/2020 12:53 PM

## 2020-04-12 NOTE — Progress Notes (Signed)
   NURSE VISIT- VAGINITIS/STD/POC  SUBJECTIVE:  Jacqueline Barrera is a 24 y.o. [id number] GYN patientfemale here for a vaginal swab for vaginitis screening, STD screen.  She reports the following symptoms: none for 0 days. Denies abnormal vaginal bleeding, significant pelvic pain, fever, or UTI symptoms.  OBJECTIVE:  There were no vitals taken for this visit.  Appears well, in no apparent distress  ASSESSMENT: Vaginal swab for STD screen  PLAN: Self-collected vaginal probe for Gonorrhea, Chlamydia, Trichomonas, Bacterial Vaginosis, Yeast, Yeast sent to lab Treatment: to be determined once results are received Follow-up as needed if symptoms persist/worsen, or new symptoms develop  Lynda Capistran A Clayton Bosserman  04/12/2020 9:13 AM

## 2020-04-14 LAB — CERVICOVAGINAL ANCILLARY ONLY
Bacterial Vaginitis (gardnerella): POSITIVE — AB
Candida Glabrata: NEGATIVE
Candida Vaginitis: POSITIVE — AB
Chlamydia: NEGATIVE
Comment: NEGATIVE
Comment: NEGATIVE
Comment: NEGATIVE
Comment: NEGATIVE
Comment: NEGATIVE
Comment: NORMAL
Neisseria Gonorrhea: NEGATIVE
Trichomonas: NEGATIVE

## 2020-04-17 ENCOUNTER — Other Ambulatory Visit: Payer: Self-pay | Admitting: Adult Health

## 2020-04-17 MED ORDER — METRONIDAZOLE 500 MG PO TABS: 500.0000 mg | ORAL_TABLET | Freq: Two times a day (BID) | ORAL | 0 refills | Status: DC

## 2020-04-17 MED ORDER — FLUCONAZOLE 150 MG PO TABS: ORAL_TABLET | ORAL | 0 refills | Status: DC

## 2020-04-17 NOTE — Progress Notes (Signed)
+  BV and yeast on vaginal swab will rx flagyl and diflucan 

## 2020-07-31 ENCOUNTER — Other Ambulatory Visit: Payer: Medicaid Other

## 2020-08-07 ENCOUNTER — Other Ambulatory Visit (INDEPENDENT_AMBULATORY_CARE_PROVIDER_SITE_OTHER): Payer: Medicaid Other | Admitting: *Deleted

## 2020-08-07 ENCOUNTER — Other Ambulatory Visit (HOSPITAL_COMMUNITY)
Admission: RE | Admit: 2020-08-07 | Discharge: 2020-08-07 | Disposition: A | Discharge status: Home / Self Care | Payer: Medicaid Other | Source: Ambulatory Visit | Attending: Obstetrics & Gynecology | Admitting: Obstetrics & Gynecology

## 2020-08-07 ENCOUNTER — Other Ambulatory Visit: Payer: Self-pay

## 2020-08-07 DIAGNOSIS — N898 Other specified noninflammatory disorders of vagina: Secondary | ICD-10-CM | POA: Diagnosis not present

## 2020-08-07 NOTE — Progress Notes (Signed)
   NURSE VISIT- STD  SUBJECTIVE:  Jacqueline Barrera is a 25 y.o. [id number] GYN patientfemale here for a vaginal swab for STD screen.  She reports the following symptoms: vaginal discharge with odor for 1 week. Denies abnormal vaginal bleeding, significant pelvic pain, fever, or UTI symptoms.  OBJECTIVE:  There were no vitals taken for this visit.  Appears well, in no apparent distress  ASSESSMENT: Vaginal swab for STD screen  PLAN: Self-collected vaginal probe for Gonorrhea, Chlamydia, Trichomonas, Bacterial Vaginosis, Yeast sent to lab Treatment: to be determined once results are received Follow-up as needed if symptoms persist/worsen, or new symptoms develop  [name]  08/07/2020 3:27 PM

## 2020-08-07 NOTE — Progress Notes (Signed)
Chart reviewed for nurse visit. Agree with plan of care.  Adline Potter, NP 08/07/2020 3:55 PM

## 2020-08-09 LAB — CERVICOVAGINAL ANCILLARY ONLY
Bacterial Vaginitis (gardnerella): NEGATIVE
Candida Glabrata: NEGATIVE
Candida Vaginitis: NEGATIVE
Chlamydia: NEGATIVE
Comment: NEGATIVE
Comment: NEGATIVE
Comment: NEGATIVE
Comment: NEGATIVE
Comment: NEGATIVE
Comment: NORMAL
Neisseria Gonorrhea: NEGATIVE
Trichomonas: NEGATIVE

## 2020-09-28 ENCOUNTER — Other Ambulatory Visit (HOSPITAL_COMMUNITY)
Admission: RE | Admit: 2020-09-28 | Discharge: 2020-09-28 | Disposition: A | Discharge status: Home / Self Care | Payer: Medicaid Other | Source: Ambulatory Visit | Attending: Obstetrics & Gynecology | Admitting: Obstetrics & Gynecology

## 2020-09-28 ENCOUNTER — Other Ambulatory Visit (INDEPENDENT_AMBULATORY_CARE_PROVIDER_SITE_OTHER): Payer: Medicaid Other | Admitting: *Deleted

## 2020-09-28 ENCOUNTER — Other Ambulatory Visit: Payer: Self-pay

## 2020-09-28 DIAGNOSIS — Z3202 Encounter for pregnancy test, result negative: Secondary | ICD-10-CM

## 2020-09-28 DIAGNOSIS — Z113 Encounter for screening for infections with a predominantly sexual mode of transmission: Secondary | ICD-10-CM

## 2020-09-28 LAB — POCT URINE PREGNANCY: Preg Test, Ur: NEGATIVE

## 2020-09-28 NOTE — Progress Notes (Signed)
Chart reviewed for nurse visit. Agree with plan of care.  Marelly Wehrman A, NP 09/28/2020 5:05 PM  

## 2020-09-28 NOTE — Progress Notes (Signed)
   NURSE VISIT- STD  SUBJECTIVE:  Jacqueline Barrera is a 25 y.o. [id number] GYN patientfemale here for a vaginal swab for STD screen.  She reports the following symptoms: none for 0 days. Denies abnormal vaginal bleeding, significant pelvic pain, fever, or UTI symptoms.  OBJECTIVE:  There were no vitals taken for this visit.  Appears well, in no apparent distress  ASSESSMENT: Vaginal swab for STD screen  PLAN: Self-collected vaginal probe for Gonorrhea, Chlamydia, Trichomonas, Bacterial Vaginosis, Yeast sent to lab. Pt also requested a pregnancy test. UPT was negative.  Treatment: to be determined once results are received Follow-up as needed if symptoms persist/worsen, or new symptoms develop  [name]  09/28/2020 4:23 PM

## 2020-10-02 ENCOUNTER — Other Ambulatory Visit: Payer: Self-pay | Admitting: Adult Health

## 2020-10-02 LAB — CERVICOVAGINAL ANCILLARY ONLY
Bacterial Vaginitis (gardnerella): POSITIVE — AB
Candida Glabrata: NEGATIVE
Candida Vaginitis: NEGATIVE
Chlamydia: NEGATIVE
Comment: NEGATIVE
Comment: NEGATIVE
Comment: NEGATIVE
Comment: NEGATIVE
Comment: NEGATIVE
Comment: NORMAL
Neisseria Gonorrhea: NEGATIVE
Trichomonas: NEGATIVE

## 2020-10-02 MED ORDER — METRONIDAZOLE 500 MG PO TABS: 500.0000 mg | ORAL_TABLET | Freq: Two times a day (BID) | ORAL | 0 refills | Status: DC

## 2020-10-02 NOTE — Progress Notes (Signed)
+  BV on vaginal swab will rx flagyl 

## 2020-10-20 ENCOUNTER — Other Ambulatory Visit: Payer: Self-pay | Admitting: *Deleted

## 2020-10-20 MED ORDER — NORETHIN ACE-ETH ESTRAD-FE 1-20 MG-MCG PO TABS: 1.0000 | ORAL_TABLET | Freq: Every day | ORAL | 4 refills | Status: DC

## 2021-01-23 ENCOUNTER — Other Ambulatory Visit (HOSPITAL_COMMUNITY)
Admission: RE | Admit: 2021-01-23 | Discharge: 2021-01-23 | Disposition: A | Discharge status: Home / Self Care | Payer: Medicaid Other | Source: Ambulatory Visit | Attending: Obstetrics & Gynecology | Admitting: Obstetrics & Gynecology

## 2021-01-23 ENCOUNTER — Other Ambulatory Visit (INDEPENDENT_AMBULATORY_CARE_PROVIDER_SITE_OTHER): Payer: Medicaid Other

## 2021-01-23 ENCOUNTER — Other Ambulatory Visit: Payer: Self-pay

## 2021-01-23 DIAGNOSIS — N898 Other specified noninflammatory disorders of vagina: Secondary | ICD-10-CM | POA: Insufficient documentation

## 2021-01-23 DIAGNOSIS — Z3200 Encounter for pregnancy test, result unknown: Secondary | ICD-10-CM

## 2021-01-23 LAB — POCT URINE PREGNANCY: Preg Test, Ur: NEGATIVE

## 2021-01-23 NOTE — Progress Notes (Addendum)
   NURSE VISIT- VAGINITIS/STD/POC  SUBJECTIVE:  Jacqueline Barrera is a 25 y.o. [id number] GYN patientfemale here for a vaginal swab for vaginitis screening, STD screen.  She reports the following symptoms: none  Denies abnormal vaginal bleeding, significant pelvic pain, fever, or UTI symptoms.  OBJECTIVE:  LMP 12/27/2020 (Approximate)   Appears well, in no apparent distress  ASSESSMENT: Vaginal swab for  vaginitis & STD screening  PLAN: Self-collected vaginal probe for Gonorrhea, Chlamydia, Trichomonas, Bacterial Vaginosis, Yeast sent to lab Treatment: to be determined once results are received Follow-up as needed if symptoms persist/worsen, or new symptoms develop    NURSE VISIT- PREGNANCY CONFIRMATION   SUBJECTIVE:  Jacqueline Barrera is a 25 y.o. [phone] female at Unknown by uncertain LMP of Patient's last menstrual period was 12/27/2020 (approximate). Here for pregnancy confirmation.  Home pregnancy test: not taken  She reports no complaints.  She is not taking prenatal vitamins.    OBJECTIVE:  LMP 12/27/2020 (Approximate)   Appears well, in no apparent distress  Results for orders placed or performed in visit on 01/23/21 (from the past 24 hour(s))  POCT urine pregnancy   Collection Time: 01/23/21  3:24 PM  Result Value Ref Range   Preg Test, Ur Negative Negative    ASSESSMENT: Negative pregnancy test    PLAN: Repeat UPT if period does not start on time  Nausea medicines: not currently needed   OB packet given: No  Ottie Neglia A Ranier Coach  01/23/2021 3:27 PM   Chart reviewed for nurse visit. Agree with plan of care.  Cheral Marker, [place] 01/23/2021 3:44 PM

## 2021-01-25 LAB — CERVICOVAGINAL ANCILLARY ONLY
Bacterial Vaginitis (gardnerella): NEGATIVE
Candida Glabrata: NEGATIVE
Candida Vaginitis: POSITIVE — AB
Chlamydia: NEGATIVE
Comment: NEGATIVE
Comment: NEGATIVE
Comment: NEGATIVE
Comment: NEGATIVE
Comment: NEGATIVE
Comment: NORMAL
Neisseria Gonorrhea: NEGATIVE
Trichomonas: NEGATIVE

## 2021-01-30 ENCOUNTER — Other Ambulatory Visit: Payer: Self-pay | Admitting: Women's Health

## 2021-01-30 MED ORDER — FLUCONAZOLE 150 MG PO TABS: 150.0000 mg | ORAL_TABLET | Freq: Once | ORAL | 0 refills | Status: AC

## 2021-04-03 DIAGNOSIS — H6993 Unspecified Eustachian tube disorder, bilateral: Secondary | ICD-10-CM | POA: Insufficient documentation

## 2021-04-03 DIAGNOSIS — H7291 Unspecified perforation of tympanic membrane, right ear: Secondary | ICD-10-CM | POA: Insufficient documentation

## 2021-04-03 DIAGNOSIS — H6983 Other specified disorders of Eustachian tube, bilateral: Secondary | ICD-10-CM | POA: Diagnosis not present

## 2021-05-03 ENCOUNTER — Other Ambulatory Visit (HOSPITAL_COMMUNITY)
Admission: RE | Admit: 2021-05-03 | Discharge: 2021-05-03 | Disposition: A | Discharge status: Home / Self Care | Payer: Medicaid Other | Source: Ambulatory Visit | Attending: Obstetrics & Gynecology | Admitting: Obstetrics & Gynecology

## 2021-05-03 ENCOUNTER — Other Ambulatory Visit: Payer: Self-pay

## 2021-05-03 ENCOUNTER — Other Ambulatory Visit (INDEPENDENT_AMBULATORY_CARE_PROVIDER_SITE_OTHER): Payer: Medicaid Other | Admitting: *Deleted

## 2021-05-03 DIAGNOSIS — N898 Other specified noninflammatory disorders of vagina: Secondary | ICD-10-CM | POA: Insufficient documentation

## 2021-05-03 DIAGNOSIS — Z113 Encounter for screening for infections with a predominantly sexual mode of transmission: Secondary | ICD-10-CM | POA: Insufficient documentation

## 2021-05-03 NOTE — Progress Notes (Signed)
Chart reviewed for nurse visit. Agree with plan of care.  Adline Potter, NP 05/03/2021 9:09 AM

## 2021-05-03 NOTE — Progress Notes (Signed)
° °  NURSE VISIT- VAGINITIS/STD/POC  SUBJECTIVE:  Jacqueline Barrera is a 25 y.o. [id number] GYN patientfemale here for a vaginal swab for vaginitis screening, STD screen.  She reports the following symptoms: discharge described as white for several days days. Denies abnormal vaginal bleeding, significant pelvic pain, fever, or UTI symptoms.  OBJECTIVE:  There were no vitals taken for this visit.  Appears well, in no apparent distress  ASSESSMENT: Vaginal swab for STD screen  PLAN: Self-collected vaginal probe for Gonorrhea, Chlamydia, Trichomonas, Bacterial Vaginosis, Yeast sent to lab Treatment: to be determined once results are received Follow-up as needed if symptoms persist/worsen, or new symptoms develop  Annamarie Dawley  05/03/2021 9:04 AM

## 2021-05-04 ENCOUNTER — Encounter: Payer: Self-pay | Admitting: Adult Health

## 2021-05-04 ENCOUNTER — Other Ambulatory Visit: Payer: Self-pay | Admitting: Adult Health

## 2021-05-04 DIAGNOSIS — A599 Trichomoniasis, unspecified: Secondary | ICD-10-CM | POA: Insufficient documentation

## 2021-05-04 HISTORY — DX: Trichomoniasis, unspecified: A59.9

## 2021-05-04 LAB — CERVICOVAGINAL ANCILLARY ONLY
Bacterial Vaginitis (gardnerella): POSITIVE — AB
Candida Glabrata: NEGATIVE
Candida Vaginitis: NEGATIVE
Chlamydia: NEGATIVE
Comment: NEGATIVE
Comment: NEGATIVE
Comment: NEGATIVE
Comment: NEGATIVE
Comment: NEGATIVE
Comment: NORMAL
Neisseria Gonorrhea: NEGATIVE
Trichomonas: POSITIVE — AB

## 2021-05-04 MED ORDER — METRONIDAZOLE 500 MG PO TABS: 500.0000 mg | ORAL_TABLET | Freq: Two times a day (BID) | ORAL | 0 refills | Status: DC

## 2021-05-04 NOTE — Progress Notes (Signed)
+  BV and trich on vaginal swab, will rx flagyl

## 2021-08-06 ENCOUNTER — Other Ambulatory Visit (HOSPITAL_COMMUNITY)
Admission: RE | Admit: 2021-08-06 | Discharge: 2021-08-06 | Disposition: A | Discharge status: Home / Self Care | Payer: Medicaid Other | Source: Ambulatory Visit | Attending: Obstetrics & Gynecology | Admitting: Obstetrics & Gynecology

## 2021-08-06 ENCOUNTER — Other Ambulatory Visit: Payer: Self-pay

## 2021-08-06 ENCOUNTER — Other Ambulatory Visit (INDEPENDENT_AMBULATORY_CARE_PROVIDER_SITE_OTHER): Payer: Medicaid Other

## 2021-08-06 DIAGNOSIS — N898 Other specified noninflammatory disorders of vagina: Secondary | ICD-10-CM | POA: Insufficient documentation

## 2021-08-06 NOTE — Progress Notes (Addendum)
? ?  NURSE VISIT- VAGINITIS/STD/POC ? ?SUBJECTIVE:  ?Jacqueline Barrera is a 26 y.o. [id number] GYN patientfemale here for a vaginal swab for vaginitis screening, STD screen.  She reports the following symptoms: odor for 5 days. ?Denies abnormal vaginal bleeding, significant pelvic pain, fever, or UTI symptoms. ? ?OBJECTIVE:  ?There were no vitals taken for this visit.  ?Appears well, in no apparent distress ? ?ASSESSMENT: ?Vaginal swab for  Vaginitis & STD screening ? ?PLAN: ?Self-collected vaginal probe for Gonorrhea, Chlamydia, Trichomonas, Bacterial Vaginosis, Yeast sent to lab ?Treatment: to be determined once results are received ?Follow-up as needed if symptoms persist/worsen, or new symptoms develop ? ?[name] A [name]  ?08/06/2021 ?3:05 PM  ?Chart reviewed for nurse visit. Agree with plan of care.  ?[name], CNM ?08/06/2021 7:19 PM  ? ?

## 2021-08-08 LAB — CERVICOVAGINAL ANCILLARY ONLY
Bacterial Vaginitis (gardnerella): POSITIVE — AB
Candida Glabrata: NEGATIVE
Candida Vaginitis: NEGATIVE
Chlamydia: NEGATIVE
Comment: NEGATIVE
Comment: NEGATIVE
Comment: NEGATIVE
Comment: NEGATIVE
Comment: NEGATIVE
Comment: NORMAL
Neisseria Gonorrhea: NEGATIVE
Trichomonas: NEGATIVE

## 2021-08-09 ENCOUNTER — Other Ambulatory Visit: Payer: Self-pay | Admitting: Advanced Practice Midwife

## 2021-08-09 MED ORDER — METRONIDAZOLE 500 MG PO TABS: 500.0000 mg | ORAL_TABLET | Freq: Two times a day (BID) | ORAL | 0 refills | Status: DC

## 2021-08-09 NOTE — Progress Notes (Unsigned)
Flagyl for odor/BV on swab ?

## 2021-10-26 ENCOUNTER — Ambulatory Visit (INDEPENDENT_AMBULATORY_CARE_PROVIDER_SITE_OTHER): Payer: Medicaid Other | Admitting: *Deleted

## 2021-10-26 ENCOUNTER — Other Ambulatory Visit (HOSPITAL_COMMUNITY)
Admission: RE | Admit: 2021-10-26 | Discharge: 2021-10-26 | Disposition: A | Discharge status: Home / Self Care | Payer: Medicaid Other | Source: Ambulatory Visit | Attending: Obstetrics & Gynecology | Admitting: Obstetrics & Gynecology

## 2021-10-26 DIAGNOSIS — Z113 Encounter for screening for infections with a predominantly sexual mode of transmission: Secondary | ICD-10-CM | POA: Diagnosis not present

## 2021-10-26 NOTE — Progress Notes (Signed)
   NURSE VISIT- VAGINITIS/STD  SUBJECTIVE:  Jacqueline Barrera is a 26 y.o. [id number] GYN patientfemale here for a vaginal swab for vaginitis screening, STD screen.  She reports the following symptoms: none. "Just want to be checked for everything" Denies abnormal vaginal bleeding, significant pelvic pain, fever, or UTI symptoms.  OBJECTIVE:  There were no vitals taken for this visit.  Appears well, in no apparent distress  ASSESSMENT: Vaginal swab for vaginitis screening/STD screen  PLAN: Self-collected vaginal probe for Gonorrhea, Chlamydia, Trichomonas, Bacterial Vaginosis, Yeast sent to lab Treatment: to be determined once results are received Follow-up as needed if symptoms persist/worsen, or new symptoms develop  [name]  10/26/2021 11:07 AM

## 2021-10-29 LAB — CERVICOVAGINAL ANCILLARY ONLY
Bacterial Vaginitis (gardnerella): NEGATIVE
Candida Glabrata: NEGATIVE
Candida Vaginitis: NEGATIVE
Chlamydia: NEGATIVE
Comment: NEGATIVE
Comment: NEGATIVE
Comment: NEGATIVE
Comment: NEGATIVE
Comment: NEGATIVE
Comment: NORMAL
Neisseria Gonorrhea: NEGATIVE
Trichomonas: NEGATIVE

## 2021-11-14 ENCOUNTER — Ambulatory Visit: Payer: Medicaid Other | Admitting: Adult Health

## 2021-11-21 ENCOUNTER — Ambulatory Visit: Payer: Medicaid Other

## 2021-11-23 ENCOUNTER — Telehealth: Payer: Medicaid Other | Admitting: Physician Assistant

## 2021-11-23 DIAGNOSIS — B3731 Acute candidiasis of vulva and vagina: Secondary | ICD-10-CM | POA: Diagnosis not present

## 2021-11-23 MED ORDER — FLUCONAZOLE 150 MG PO TABS: 150.0000 mg | ORAL_TABLET | Freq: Once | ORAL | 0 refills | Status: AC

## 2021-11-23 NOTE — Progress Notes (Signed)
Virtual Visit Consent   Jacqueline Barrera, you are scheduled for a virtual visit with a [facility] provider today. Just as with appointments in the office, your consent must be obtained to participate. Your consent will be active for this visit and any virtual visit you may have with one of our providers in the next 365 days. If you have a MyChart account, a copy of this consent can be sent to you electronically.  As this is a virtual visit, video technology does not allow for your provider to perform a traditional examination. This may limit your provider's ability to fully assess your condition. If your provider identifies any concerns that need to be evaluated in person or the need to arrange testing (such as labs, EKG, etc.), we will make arrangements to do so. Although advances in technology are sophisticated, we cannot ensure that it will always work on either your end or our end. If the connection with a video visit is poor, the visit may have to be switched to a telephone visit. With either a video or telephone visit, we are not always able to ensure that we have a secure connection.  By engaging in this virtual visit, you consent to the provision of healthcare and authorize for your insurance to be billed (if applicable) for the services provided during this visit. Depending on your insurance coverage, you may receive a charge related to this service.  I need to obtain your verbal consent now. Are you willing to proceed with your visit today? Jacqueline Barrera has provided verbal consent on 11/23/2021 for a virtual visit (video or telephone). Jacqueline Loveless, PA-C  Date: 11/23/2021 1:37 PM  Virtual Visit via Video Note   I, Jacqueline Barrera, connected with  Jacqueline Barrera  ([id number], Dec 11, 1995) on 11/23/21 at  1:30 PM EDT by a video-enabled telemedicine application and verified that I am speaking with the correct person using two identifiers.  Location: Patient: Virtual Visit Location  Patient: Home Provider: Virtual Visit Location Provider: Home Office   I discussed the limitations of evaluation and management by telemedicine and the availability of in person appointments. The patient expressed understanding and agreed to proceed.    History of Present Illness: Jacqueline Barrera is a 26 y.o. who identifies as a female who was assigned female at birth, and is being seen today for vaginal discharge.  HPI: Vaginal Discharge The patient's primary symptoms include genital itching and vaginal discharge. The patient's pertinent negatives include no genital odor. This is a new problem. The current episode started in the past 7 days. The problem occurs constantly. The problem has been unchanged. The patient is experiencing no pain. Pertinent negatives include no abdominal pain, back pain, chills, fever, flank pain, frequency or nausea. The vaginal discharge was white and thick. Nothing aggravates the symptoms. Treatments tried: monistat 1 day. The treatment provided no relief.      Problems:  Patient Active Problem List   Diagnosis Date Noted   Trichimoniasis 05/04/2021   Screening examination for STD (sexually transmitted disease) 04/06/2020   Nausea 04/06/2020   Encounter for initial prescription of contraceptive pills 01/19/2020   Abnormal uterine bleeding (AUB) 01/19/2020   Pregnancy examination or test, negative result 01/19/2020   History of preterm delivery 12/25/2017   History of IUFD 06/10/2017   H/O preeclampsia 06/10/2017   Marijuana use 08/28/2016    Allergies:  Allergies  Allergen Reactions   Other Nausea And Vomiting    jalapenos   Medications:  Current  Outpatient Medications:    fluconazole (DIFLUCAN) 150 MG tablet, Take 1 tablet (150 mg total) by mouth once for 1 dose., Disp: 1 tablet, Rfl: 0  Observations/Objective: Patient is well-developed, well-nourished in no acute distress.  Resting comfortably at home.  Head is normocephalic, atraumatic.  No  labored breathing.  Speech is clear and coherent with logical content.  Patient is alert and oriented at baseline.    Assessment and Plan: 1. Yeast vaginitis - fluconazole (DIFLUCAN) 150 MG tablet; Take 1 tablet (150 mg total) by mouth once for 1 dose.  Dispense: 1 tablet; Refill: 0  - Tried Monistat, symptoms of itching improved, but discharge remains - Add diflucan - Avoid bubble baths and scented soaps, lotions - Seek in person evaluation next week if symptoms persist or worsen  Follow Up Instructions: I discussed the assessment and treatment plan with the patient. The patient was provided an opportunity to ask questions and all were answered. The patient agreed with the plan and demonstrated an understanding of the instructions.  A copy of instructions were sent to the patient via MyChart unless otherwise noted below.    The patient was advised to call back or seek an in-person evaluation if the symptoms worsen or if the condition fails to improve as anticipated.  Time:  I spent 8 minutes with the patient via telehealth technology discussing the above problems/concerns.    Jacqueline Loveless, PA-C

## 2021-11-23 NOTE — Patient Instructions (Signed)
Denny Peon, thank you for joining Margaretann Loveless, PA-C for today's virtual visit.  While this provider is not your primary care provider (PCP), if your PCP is located in our provider database this encounter information will be shared with them immediately following your visit.  Consent: (Patient) Jacqueline Barrera provided verbal consent for this virtual visit at the beginning of the encounter.  Current Medications:  Current Outpatient Medications:    fluconazole (DIFLUCAN) 150 MG tablet, Take 1 tablet (150 mg total) by mouth once for 1 dose., Disp: 1 tablet, Rfl: 0   Medications ordered in this encounter:  Meds ordered this encounter  Medications   fluconazole (DIFLUCAN) 150 MG tablet    Sig: Take 1 tablet (150 mg total) by mouth once for 1 dose.    Dispense:  1 tablet    Refill:  0    Order Specific Question:   Supervising Provider    Answer:   Hyacinth Meeker, BRIAN [[id number]]     *If you need refills on other medications prior to your next appointment, please contact your pharmacy*  Follow-Up: Call back or seek an in-person evaluation if the symptoms worsen or if the condition fails to improve as anticipated.  Other Instructions Vaginal Yeast Infection, Adult  Vaginal yeast infection is a condition that causes vaginal discharge as well as soreness, swelling, and redness (inflammation) of the vagina. This is a common condition. Some women get this infection frequently. What are the causes? This condition is caused by a change in the normal balance of the yeast (Candida) and normal bacteria that live in the vagina. This change causes an overgrowth of yeast, which causes the inflammation. What increases the risk? The condition is more likely to develop in women who: Take antibiotic medicines. Have diabetes. Take birth control pills. Are pregnant. Douche often. Have a weak body defense system (immune system). Have been taking steroid medicines for a long time. Frequently wear tight  clothing. What are the signs or symptoms? Symptoms of this condition include: White, thick, creamy vaginal discharge. Swelling, itching, redness, and irritation of the vagina. The lips of the vagina (labia) may be affected as well. Pain or a burning feeling while urinating. Pain during sex. How is this diagnosed? This condition is diagnosed based on: Your medical history. A physical exam. A pelvic exam. Your health care provider will examine a sample of your vaginal discharge under a microscope. Your health care provider may send this sample for testing to confirm the diagnosis. How is this treated? This condition is treated with medicine. Medicines may be over-the-counter or prescription. You may be told to use one or more of the following: Medicine that is taken by mouth (orally). Medicine that is applied as a cream (topically). Medicine that is inserted directly into the vagina (suppository). Follow these instructions at home: Take or apply over-the-counter and prescription medicines only as told by your health care provider. Do not use tampons until your health care provider approves. Do not have sex until your infection has cleared. Sex can prolong or worsen your symptoms of infection. Ask your health care provider when it is safe to resume sexual activity. Keep all follow-up visits. This is important. How is this prevented?  Do not wear tight clothes, such as pantyhose or tight pants. Wear breathable cotton underwear. Do not use douches, perfumed soap, creams, or powders. Wipe from front to back after using the toilet. If you have diabetes, keep your blood sugar levels under control. Ask your health care  provider for other ways to prevent yeast infections. Contact a health care provider if: You have a fever. Your symptoms go away and then return. Your symptoms do not get better with treatment. Your symptoms get worse. You have new symptoms. You develop blisters in or around  your vagina. You have blood coming from your vagina and it is not your menstrual period. You develop pain in your abdomen. Summary Vaginal yeast infection is a condition that causes discharge as well as soreness, swelling, and redness (inflammation) of the vagina. This condition is treated with medicine. Medicines may be over-the-counter or prescription. Take or apply over-the-counter and prescription medicines only as told by your health care provider. Do not douche. Resume sexual activity or use of tampons as instructed by your health care provider. Contact a health care provider if your symptoms do not get better with treatment or your symptoms go away and then return. This information is not intended to replace advice given to you by your health care provider. Make sure you discuss any questions you have with your health care provider. Document Revised: 07/24/2020 Document Reviewed: 07/24/2020 Elsevier Patient Education  2023 Elsevier Inc.    If you have been instructed to have an in-person evaluation today at a local Urgent Care facility, please use the link below. It will take you to a list of all of our available [facility], including address, phone number and hours of operation. Please do not delay care.  [facility]  If you or a family member do not have a primary care provider, use the link below to schedule a visit and establish care. When you choose a [facility] primary care physician or advanced practice provider, you gain a long-term partner in health. Find a Primary Care Provider  Learn more about [facility]'s in-office and virtual care options: [facility] - Get Care Now

## 2021-11-28 ENCOUNTER — Other Ambulatory Visit (INDEPENDENT_AMBULATORY_CARE_PROVIDER_SITE_OTHER): Payer: Medicaid Other | Admitting: *Deleted

## 2021-11-28 ENCOUNTER — Other Ambulatory Visit (HOSPITAL_COMMUNITY)
Admission: RE | Admit: 2021-11-28 | Discharge: 2021-11-28 | Disposition: A | Discharge status: Home / Self Care | Payer: Medicaid Other | Source: Ambulatory Visit | Attending: Obstetrics & Gynecology | Admitting: Obstetrics & Gynecology

## 2021-11-28 DIAGNOSIS — Z113 Encounter for screening for infections with a predominantly sexual mode of transmission: Secondary | ICD-10-CM | POA: Insufficient documentation

## 2021-11-28 DIAGNOSIS — N898 Other specified noninflammatory disorders of vagina: Secondary | ICD-10-CM

## 2021-11-28 NOTE — Progress Notes (Signed)
   NURSE VISIT- VAGINITIS/STD/POC  SUBJECTIVE:  Jacqueline Barrera is a 26 y.o. [id number] GYN patientfemale here for a vaginal swab for vaginitis screening, STD screen.  She reports the following symptoms: discharge described as white for several days. Denies abnormal vaginal bleeding, significant pelvic pain, fever, or UTI symptoms.  OBJECTIVE:  There were no vitals taken for this visit.  Appears well, in no apparent distress  ASSESSMENT: Vaginal swab for STD screen  PLAN: Self-collected vaginal probe for Gonorrhea, Chlamydia, Trichomonas, Bacterial Vaginosis, Yeast sent to lab Treatment: to be determined once results are received Follow-up as needed if symptoms persist/worsen, or new symptoms develop  Annamarie Dawley  11/28/2021 10:03 AM

## 2021-11-29 LAB — CERVICOVAGINAL ANCILLARY ONLY
Bacterial Vaginitis (gardnerella): POSITIVE — AB
Candida Glabrata: NEGATIVE
Candida Vaginitis: POSITIVE — AB
Chlamydia: NEGATIVE
Comment: NEGATIVE
Comment: NEGATIVE
Comment: NEGATIVE
Comment: NEGATIVE
Comment: NEGATIVE
Comment: NORMAL
Neisseria Gonorrhea: NEGATIVE
Trichomonas: NEGATIVE

## 2021-11-30 ENCOUNTER — Telehealth: Payer: Self-pay

## 2021-11-30 ENCOUNTER — Other Ambulatory Visit: Payer: Self-pay | Admitting: Adult Health

## 2021-11-30 MED ORDER — FLUCONAZOLE 150 MG PO TABS: ORAL_TABLET | ORAL | 1 refills | Status: DC

## 2021-11-30 MED ORDER — METRONIDAZOLE 500 MG PO TABS: 500.0000 mg | ORAL_TABLET | Freq: Two times a day (BID) | ORAL | 0 refills | Status: DC

## 2021-11-30 NOTE — Telephone Encounter (Signed)
Victorino Dike to review and send in meds.

## 2021-11-30 NOTE — Telephone Encounter (Signed)
Patient called and stated that she would like something called in

## 2022-01-10 ENCOUNTER — Other Ambulatory Visit (INDEPENDENT_AMBULATORY_CARE_PROVIDER_SITE_OTHER): Payer: Medicaid Other | Admitting: *Deleted

## 2022-01-10 ENCOUNTER — Other Ambulatory Visit (HOSPITAL_COMMUNITY)
Admission: RE | Admit: 2022-01-10 | Discharge: 2022-01-10 | Disposition: A | Discharge status: Home / Self Care | Payer: Medicaid Other | Source: Ambulatory Visit | Attending: Obstetrics & Gynecology | Admitting: Obstetrics & Gynecology

## 2022-01-10 DIAGNOSIS — Z113 Encounter for screening for infections with a predominantly sexual mode of transmission: Secondary | ICD-10-CM

## 2022-01-10 NOTE — Progress Notes (Signed)
   NURSE VISIT- VAGINITIS/STD/POC  SUBJECTIVE:  Jacqueline Barrera is a 26 y.o. [id number] GYN patientfemale here for a vaginal swab for STD screen.  She reports the following symptoms: none for 0 days. Denies abnormal vaginal bleeding, significant pelvic pain, fever, or UTI symptoms.  OBJECTIVE:  There were no vitals taken for this visit.  Appears well, in no apparent distress  ASSESSMENT: Vaginal swab for STD screen  PLAN: Self-collected vaginal probe for Gonorrhea, Chlamydia, Trichomonas, Bacterial Vaginosis, Yeast sent to lab Treatment: to be determined once results are received Follow-up as needed if symptoms persist/worsen, or new symptoms develop  Annamarie Dawley  01/10/2022 4:19 PM

## 2022-01-14 ENCOUNTER — Other Ambulatory Visit: Payer: Self-pay | Admitting: Obstetrics & Gynecology

## 2022-01-14 ENCOUNTER — Telehealth: Payer: Self-pay

## 2022-01-14 DIAGNOSIS — N760 Acute vaginitis: Secondary | ICD-10-CM

## 2022-01-14 LAB — CERVICOVAGINAL ANCILLARY ONLY
Bacterial Vaginitis (gardnerella): POSITIVE — AB
Candida Glabrata: NEGATIVE
Candida Vaginitis: POSITIVE — AB
Chlamydia: NEGATIVE
Comment: NEGATIVE
Comment: NEGATIVE
Comment: NEGATIVE
Comment: NEGATIVE
Comment: NEGATIVE
Comment: NORMAL
Neisseria Gonorrhea: NEGATIVE
Trichomonas: NEGATIVE

## 2022-01-14 MED ORDER — FLUCONAZOLE 150 MG PO TABS: ORAL_TABLET | ORAL | 1 refills | Status: DC

## 2022-01-14 MED ORDER — METRONIDAZOLE 500 MG PO TABS: 500.0000 mg | ORAL_TABLET | Freq: Two times a day (BID) | ORAL | 2 refills | Status: DC

## 2022-01-14 NOTE — Telephone Encounter (Signed)
Patient would like for someone to call her about her test results 

## 2022-01-14 NOTE — Progress Notes (Signed)
Prescriptions for BV and yeast

## 2022-03-12 ENCOUNTER — Other Ambulatory Visit (INDEPENDENT_AMBULATORY_CARE_PROVIDER_SITE_OTHER): Payer: Medicaid Other

## 2022-03-12 ENCOUNTER — Other Ambulatory Visit (HOSPITAL_COMMUNITY)
Admission: RE | Admit: 2022-03-12 | Discharge: 2022-03-12 | Disposition: A | Discharge status: Home / Self Care | Payer: Medicaid Other | Source: Ambulatory Visit | Attending: Obstetrics & Gynecology | Admitting: Obstetrics & Gynecology

## 2022-03-12 ENCOUNTER — Other Ambulatory Visit: Payer: Medicaid Other

## 2022-03-12 DIAGNOSIS — Z113 Encounter for screening for infections with a predominantly sexual mode of transmission: Secondary | ICD-10-CM | POA: Diagnosis not present

## 2022-03-12 DIAGNOSIS — Z3202 Encounter for pregnancy test, result negative: Secondary | ICD-10-CM | POA: Diagnosis not present

## 2022-03-12 LAB — POCT URINE PREGNANCY: Preg Test, Ur: NEGATIVE

## 2022-03-12 NOTE — Progress Notes (Signed)
   NURSE VISIT- STD  SUBJECTIVE:  Jacqueline Barrera is a 26 y.o. [id number] GYN patientfemale here for a vaginal swab for STD screen and pregnancy test.  She reports the following symptoms: none. "Just want to be on the safe side. Denies abnormal vaginal bleeding, significant pelvic pain, fever, or UTI symptoms.  OBJECTIVE:  There were no vitals taken for this visit.  Appears well, in no apparent distress  ASSESSMENT: Vaginal swab for STD screenP Pregnancy test negative  PLAN: Self-collected vaginal probe for Gonorrhea, Chlamydia, Trichomonas, Bacterial Vaginosis, Yeast sent to lab Treatment: to be determined once results are received Follow-up as needed if symptoms persist/worsen, or new symptoms develop  Alice Rieger  03/12/2022 4:36 PM

## 2022-03-14 ENCOUNTER — Other Ambulatory Visit: Payer: Self-pay | Admitting: Adult Health

## 2022-03-14 DIAGNOSIS — N760 Acute vaginitis: Secondary | ICD-10-CM

## 2022-03-14 LAB — CERVICOVAGINAL ANCILLARY ONLY
Bacterial Vaginitis (gardnerella): NEGATIVE
Candida Glabrata: NEGATIVE
Candida Vaginitis: POSITIVE — AB
Chlamydia: NEGATIVE
Comment: NEGATIVE
Comment: NEGATIVE
Comment: NEGATIVE
Comment: NEGATIVE
Comment: NEGATIVE
Comment: NORMAL
Neisseria Gonorrhea: NEGATIVE
Trichomonas: NEGATIVE

## 2022-03-14 MED ORDER — FLUCONAZOLE 150 MG PO TABS: ORAL_TABLET | ORAL | 1 refills | Status: DC

## 2022-03-14 NOTE — Progress Notes (Signed)
+  yeast on vaginal swab will rx diflucan  

## 2022-04-16 ENCOUNTER — Other Ambulatory Visit (INDEPENDENT_AMBULATORY_CARE_PROVIDER_SITE_OTHER): Payer: Medicaid Other

## 2022-04-16 ENCOUNTER — Other Ambulatory Visit (HOSPITAL_COMMUNITY)
Admission: RE | Admit: 2022-04-16 | Discharge: 2022-04-16 | Disposition: A | Discharge status: Home / Self Care | Payer: Medicaid Other | Source: Ambulatory Visit | Attending: Obstetrics & Gynecology | Admitting: Obstetrics & Gynecology

## 2022-04-16 DIAGNOSIS — N898 Other specified noninflammatory disorders of vagina: Secondary | ICD-10-CM | POA: Insufficient documentation

## 2022-04-16 NOTE — Progress Notes (Signed)
   NURSE VISIT- VAGINITIS  SUBJECTIVE:  Jacqueline Barrera is a 26 y.o. [id number] GYN patientfemale here for a vaginal swab for vaginitis screening.  She reports the following symptoms: discharge described as milky and odor for 3 days. Denies abnormal vaginal bleeding, significant pelvic pain, fever, or UTI symptoms.  OBJECTIVE:  There were no vitals taken for this visit.  Appears well, in no apparent distress  ASSESSMENT: Vaginal swab for vaginitis screening  PLAN: Self-collected vaginal probe for Gonorrhea, Chlamydia, Trichomonas, Bacterial Vaginosis, Yeast sent to lab Treatment: to be determined once results are received Follow-up as needed if symptoms persist/worsen, or new symptoms develop  [name]  04/16/2022 12:20 PM

## 2022-04-17 LAB — CERVICOVAGINAL ANCILLARY ONLY
Bacterial Vaginitis (gardnerella): POSITIVE — AB
Candida Glabrata: NEGATIVE
Candida Vaginitis: NEGATIVE
Chlamydia: NEGATIVE
Comment: NEGATIVE
Comment: NEGATIVE
Comment: NEGATIVE
Comment: NEGATIVE
Comment: NEGATIVE
Comment: NORMAL
Neisseria Gonorrhea: NEGATIVE
Trichomonas: NEGATIVE

## 2022-04-18 ENCOUNTER — Other Ambulatory Visit: Payer: Self-pay | Admitting: Adult Health

## 2022-04-18 DIAGNOSIS — N760 Acute vaginitis: Secondary | ICD-10-CM

## 2022-04-18 MED ORDER — METRONIDAZOLE 500 MG PO TABS: 500.0000 mg | ORAL_TABLET | Freq: Two times a day (BID) | ORAL | 2 refills | Status: DC

## 2022-04-18 NOTE — Progress Notes (Signed)
+  BV on vaginal swab will rx flagyl,no sex or alcohol while taking  ?

## 2022-04-29 ENCOUNTER — Encounter: Payer: Self-pay | Admitting: Physician Assistant

## 2022-04-30 ENCOUNTER — Ambulatory Visit (INDEPENDENT_AMBULATORY_CARE_PROVIDER_SITE_OTHER): Payer: Medicaid Other | Admitting: *Deleted

## 2022-04-30 ENCOUNTER — Other Ambulatory Visit: Payer: Self-pay | Admitting: Adult Health

## 2022-04-30 VITALS — BP 128/89 | HR 82 | Wt 182.0 lb

## 2022-04-30 DIAGNOSIS — Z3201 Encounter for pregnancy test, result positive: Secondary | ICD-10-CM | POA: Diagnosis not present

## 2022-04-30 DIAGNOSIS — N926 Irregular menstruation, unspecified: Secondary | ICD-10-CM | POA: Diagnosis not present

## 2022-04-30 LAB — POCT URINE PREGNANCY: Preg Test, Ur: POSITIVE — AB

## 2022-04-30 MED ORDER — PROMETHAZINE HCL 12.5 MG PO TABS: 12.5000 mg | ORAL_TABLET | Freq: Four times a day (QID) | ORAL | 1 refills | Status: DC | PRN

## 2022-04-30 NOTE — Progress Notes (Signed)
Rx phenergan 12.5 mg  

## 2022-04-30 NOTE — Progress Notes (Signed)
   NURSE VISIT- PREGNANCY CONFIRMATION   SUBJECTIVE:  Jacqueline Barrera is a 26 y.o. [phone] female at [redacted]w[redacted]d by certain LMP of Patient's last menstrual period was 03/23/2022 (approximate). Here for pregnancy confirmation.  Home pregnancy test: positive x 2   She reports nausea and mild cramping She is not taking prenatal vitamins.    OBJECTIVE:  BP 128/89 (BP Location: Left Arm, Patient Position: Sitting, Cuff Size: Normal)   Pulse 82   Wt 182 lb (82.6 kg)   LMP 03/23/2022 (Approximate)   BMI 31.24 kg/m   Appears well, in no apparent distress  Results for orders placed or performed in visit on 04/30/22 (from the past 24 hour(s))  POCT urine pregnancy   Collection Time: 04/30/22  3:24 PM  Result Value Ref Range   Preg Test, Ur Positive (A) Negative    ASSESSMENT: Positive pregnancy test, [redacted]w[redacted]d by LMP    PLAN: Schedule for dating ultrasound in 4 weeks Prenatal vitamins: plans to begin OTC ASAP   Nausea medicines: requested-note routed to [name], NP to send prescription   OB packet given: Yes  [name]  04/30/2022 3:25 PM

## 2022-05-02 DIAGNOSIS — Z0289 Encounter for other administrative examinations: Secondary | ICD-10-CM

## 2022-05-20 NOTE — L&D Delivery Note (Addendum)
OB/GYN Faculty Practice Delivery Note  Jacqueline Barrera is a 27 y.o. [id number] s/p SVD at [redacted]w[redacted]d. She was admitted for IOL 2/2 hx of IUFD.   ROM: 0h 54m with clear fluid GBS Status:  Negative/-- (07/10 1330) Maximum Maternal Temperature: 98.7 F  Labor Progress: Initial SVE: 0.5/60/-3. She then progressed to complete.   Delivery Date/Time: 12/07/2022 at 1936. Delivery: Called to room and patient was complete and pushing. Head delivered LOA. No nuchal cord present. Shoulder and body delivered in usual fashion. Infant with spontaneous cry, placed on mother's abdomen, dried and stimulated. Cord clamped x 2 after 1-minute delay, and cut by father. Cord blood drawn. Placenta delivered spontaneously with gentle cord traction. Fundus firm with massage and Pitocin. Labia, perineum, vagina, and cervix inspected with only minimal abrasions, no repair needed.   Baby Weight: pending  Placenta: 3 vessel, intact. Sent to L&D. Complications: None Lacerations: None EBL: 88 mL Analgesia: Epidural   Infant:  APGAR (1 MIN): 9  APGAR (5 MINS): 9  Glee Arvin, MD FM PGY-3 12/07/2022, 7:54 PM   Fellow ATTESTATION  I was present and gloved for this delivery and agree with the above documentation in the resident's note except as below.  Celedonio Savage, MD [facility] (Faculty Practice) 12/09/2022, 10:09 PM

## 2022-05-29 ENCOUNTER — Other Ambulatory Visit: Payer: Self-pay | Admitting: Obstetrics & Gynecology

## 2022-05-29 ENCOUNTER — Encounter: Payer: Medicaid Other | Admitting: *Deleted

## 2022-05-29 ENCOUNTER — Other Ambulatory Visit: Payer: Medicaid Other

## 2022-05-29 DIAGNOSIS — O3680X0 Pregnancy with inconclusive fetal viability, not applicable or unspecified: Secondary | ICD-10-CM

## 2022-05-30 ENCOUNTER — Ambulatory Visit (INDEPENDENT_AMBULATORY_CARE_PROVIDER_SITE_OTHER): Payer: No Typology Code available for payment source

## 2022-05-30 DIAGNOSIS — Z3A09 9 weeks gestation of pregnancy: Secondary | ICD-10-CM | POA: Diagnosis not present

## 2022-05-30 DIAGNOSIS — O3680X0 Pregnancy with inconclusive fetal viability, not applicable or unspecified: Secondary | ICD-10-CM

## 2022-05-30 NOTE — Progress Notes (Signed)
[place] 10+6 wks,single IUP with yolk sac,FHR 166 bpm,normal ovaries,CRL 38.58 mm

## 2022-06-10 ENCOUNTER — Ambulatory Visit (INDEPENDENT_AMBULATORY_CARE_PROVIDER_SITE_OTHER): Payer: Medicaid Other | Admitting: Women's Health

## 2022-06-10 ENCOUNTER — Encounter: Payer: Self-pay | Admitting: Women's Health

## 2022-06-10 ENCOUNTER — Encounter: Payer: Medicaid Other | Admitting: *Deleted

## 2022-06-10 VITALS — BP 129/72 | HR 103 | Wt 172.6 lb

## 2022-06-10 DIAGNOSIS — F1290 Cannabis use, unspecified, uncomplicated: Secondary | ICD-10-CM

## 2022-06-10 DIAGNOSIS — F418 Other specified anxiety disorders: Secondary | ICD-10-CM | POA: Diagnosis not present

## 2022-06-10 DIAGNOSIS — Z3490 Encounter for supervision of normal pregnancy, unspecified, unspecified trimester: Secondary | ICD-10-CM | POA: Insufficient documentation

## 2022-06-10 DIAGNOSIS — Z124 Encounter for screening for malignant neoplasm of cervix: Secondary | ICD-10-CM | POA: Diagnosis not present

## 2022-06-10 DIAGNOSIS — Z3481 Encounter for supervision of other normal pregnancy, first trimester: Secondary | ICD-10-CM

## 2022-06-10 DIAGNOSIS — O0990 Supervision of high risk pregnancy, unspecified, unspecified trimester: Secondary | ICD-10-CM | POA: Insufficient documentation

## 2022-06-10 DIAGNOSIS — O0991 Supervision of high risk pregnancy, unspecified, first trimester: Secondary | ICD-10-CM

## 2022-06-10 DIAGNOSIS — Z8751 Personal history of pre-term labor: Secondary | ICD-10-CM | POA: Diagnosis not present

## 2022-06-10 DIAGNOSIS — Z3A12 12 weeks gestation of pregnancy: Secondary | ICD-10-CM

## 2022-06-10 DIAGNOSIS — Z3491 Encounter for supervision of normal pregnancy, unspecified, first trimester: Secondary | ICD-10-CM

## 2022-06-10 MED ORDER — BLOOD PRESSURE MONITOR MISC: 0 refills | Status: DC

## 2022-06-10 MED ORDER — PROGESTERONE 200 MG PO CAPS: 200.0000 mg | ORAL_CAPSULE | Freq: Every day | ORAL | 5 refills | Status: DC

## 2022-06-10 MED ORDER — DOXYLAMINE-PYRIDOXINE 10-10 MG PO TBEC: DELAYED_RELEASE_TABLET | ORAL | 6 refills | Status: DC

## 2022-06-10 MED ORDER — ASPIRIN 81 MG PO TBEC: 162.0000 mg | DELAYED_RELEASE_TABLET | Freq: Every day | ORAL | 2 refills | Status: DC

## 2022-06-10 NOTE — Patient Instructions (Signed)
Jacqueline Barrera, thank you for choosing our office today! We appreciate the opportunity to meet your healthcare needs. You may receive a short survey by mail, e-mail, or through [employer]. If you are happy with your care we would appreciate if you could take just a few minutes to complete the survey questions. We read all of your comments and take your feedback very seriously. Thank you again for choosing our office.  [facility] at [place] [facility] ([place], [place] [address]) Entrance C, located off of [address] parking   Nausea & Vomiting Have saltine crackers or pretzels by your bed and eat a few bites before you raise your head out of bed in the morning Eat small frequent meals throughout the day instead of large meals Drink plenty of fluids throughout the day to stay hydrated, just don't drink a lot of fluids with your meals.  This can make your stomach fill up faster making you feel sick Do not brush your teeth right after you eat Products with real ginger are good for nausea, like ginger ale and ginger hard candy Make sure it says made with real ginger! Sucking on sour candy like lemon heads is also good for nausea If your prenatal vitamins make you nauseated, take them at night so you will sleep through the nausea Sea Bands If you feel like you need medicine for the nausea & vomiting please let us know If you are unable to keep any fluids or food down please let us know   Constipation Drink plenty of fluid, preferably water, throughout the day Eat foods high in fiber such as fruits, vegetables, and grains Exercise, such as walking, is a good way to keep your bowels regular Drink warm fluids, especially warm prune juice, or decaf coffee Eat a 1/2 cup of real oatmeal (not instant), 1/2 cup applesauce, and 1/2-1 cup warm prune juice every day If needed, you may take Colace (docusate sodium) stool softener  once or twice a day to help keep the stool soft.  If you still are having problems with constipation, you may take Miralax once daily as needed to help keep your bowels regular.   Home Blood Pressure Monitoring for Patients   Your provider has recommended that you check your blood pressure (BP) at least once a week at home. If you do not have a blood pressure cuff at home, one will be provided for you. Contact your provider if you have not received your monitor within 1 week.   Helpful Tips for Accurate Home Blood Pressure Checks  Don't smoke, exercise, or drink caffeine 30 minutes before checking your BP Use the restroom before checking your BP (a full bladder can raise your pressure) Relax in a comfortable upright chair Feet on the ground Left arm resting comfortably on a flat surface at the level of your heart Legs uncrossed Back supported Sit quietly and don't talk Place the cuff on your bare arm Adjust snuggly, so that only two fingertips can fit between your skin and the top of the cuff Check 2 readings separated by at least one minute Keep a log of your BP readings For a visual, please reference this diagram: http://ccnc.care/bpdiagram  Provider Name: [facility]     Phone: [phone]  Zone 1: ALL CLEAR  Continue to monitor your symptoms:  BP reading is less than 140 (top number) or less than 90 (bottom  number)  No right upper stomach pain No headaches or seeing spots No feeling nauseated or throwing up No swelling in face and hands  Zone 2: CAUTION Call your doctor's office for any of the following:  BP reading is greater than 140 (top number) or greater than 90 (bottom number)  Stomach pain under your ribs in the middle or right side Headaches or seeing spots Feeling nauseated or throwing up Swelling in face and hands  Zone 3: EMERGENCY  Seek immediate medical care if you have any of the following:  BP reading is greater than160 (top number) or greater than  110 (bottom number) Severe headaches not improving with Tylenol Serious difficulty catching your breath Any worsening symptoms from Zone 2    First Trimester of Pregnancy The first trimester of pregnancy is from week 1 until the end of week 12 (months 1 through 3). A week after a sperm fertilizes an egg, the egg will implant on the wall of the uterus. This embryo will begin to develop into a baby. Genes from you and your partner are forming the baby. The female genes determine whether the baby is a boy or a girl. At 6-8 weeks, the eyes and face are formed, and the heartbeat can be seen on ultrasound. At the end of 12 weeks, all the baby's organs are formed.  Now that you are pregnant, you will want to do everything you can to have a healthy baby. Two of the most important things are to get good prenatal care and to follow your health care provider's instructions. Prenatal care is all the medical care you receive before the baby's birth. This care will help prevent, find, and treat any problems during the pregnancy and childbirth. BODY CHANGES Your body goes through many changes during pregnancy. The changes vary from woman to woman.  You may gain or lose a couple of pounds at first. You may feel sick to your stomach (nauseous) and throw up (vomit). If the vomiting is uncontrollable, call your health care provider. You may tire easily. You may develop headaches that can be relieved by medicines approved by your health care provider. You may urinate more often. Painful urination may mean you have a bladder infection. You may develop heartburn as a result of your pregnancy. You may develop constipation because certain hormones are causing the muscles that push waste through your intestines to slow down. You may develop hemorrhoids or swollen, bulging veins (varicose veins). Your breasts may begin to grow larger and become tender. Your nipples may stick out more, and the tissue that surrounds them  (areola) may become darker. Your gums may bleed and may be sensitive to brushing and flossing. Dark spots or blotches (chloasma, mask of pregnancy) may develop on your face. This will likely fade after the baby is born. Your menstrual periods will stop. You may have a loss of appetite. You may develop cravings for certain kinds of food. You may have changes in your emotions from day to day, such as being excited to be pregnant or being concerned that something may go wrong with the pregnancy and baby. You may have more vivid and strange dreams. You may have changes in your hair. These can include thickening of your hair, rapid growth, and changes in texture. Some women also have hair loss during or after pregnancy, or hair that feels dry or thin. Your hair will most likely return to normal after your baby is born. WHAT TO EXPECT AT YOUR PRENATAL  VISITS During a routine prenatal visit: You will be weighed to make sure you and the baby are growing normally. Your blood pressure will be taken. Your abdomen will be measured to track your baby's growth. The fetal heartbeat will be listened to starting around week 10 or 12 of your pregnancy. Test results from any previous visits will be discussed. Your health care provider may ask you: How you are feeling. If you are feeling the baby move. If you have had any abnormal symptoms, such as leaking fluid, bleeding, severe headaches, or abdominal cramping. If you have any questions. Other tests that may be performed during your first trimester include: Blood tests to find your blood type and to check for the presence of any previous infections. They will also be used to check for low iron levels (anemia) and Rh antibodies. Later in the pregnancy, blood tests for diabetes will be done along with other tests if problems develop. Urine tests to check for infections, diabetes, or protein in the urine. An ultrasound to confirm the proper growth and development  of the baby. An amniocentesis to check for possible genetic problems. Fetal screens for spina bifida and Down syndrome. You may need other tests to make sure you and the baby are doing well. HOME CARE INSTRUCTIONS  Medicines Follow your health care provider's instructions regarding medicine use. Specific medicines may be either safe or unsafe to take during pregnancy. Take your prenatal vitamins as directed. If you develop constipation, try taking a stool softener if your health care provider approves. Diet Eat regular, well-balanced meals. Choose a variety of foods, such as meat or vegetable-based protein, fish, milk and low-fat dairy products, vegetables, fruits, and whole grain breads and cereals. Your health care provider will help you determine the amount of weight gain that is right for you. Avoid raw meat and uncooked cheese. These carry germs that can cause birth defects in the baby. Eating four or five small meals rather than three large meals a day may help relieve nausea and vomiting. If you start to feel nauseous, eating a few soda crackers can be helpful. Drinking liquids between meals instead of during meals also seems to help nausea and vomiting. If you develop constipation, eat more high-fiber foods, such as fresh vegetables or fruit and whole grains. Drink enough fluids to keep your urine clear or pale yellow. Activity and Exercise Exercise only as directed by your health care provider. Exercising will help you: Control your weight. Stay in shape. Be prepared for labor and delivery. Experiencing pain or cramping in the lower abdomen or low back is a good sign that you should stop exercising. Check with your health care provider before continuing normal exercises. Try to avoid standing for long periods of time. Move your legs often if you must stand in one place for a long time. Avoid heavy lifting. Wear low-heeled shoes, and practice good posture. You may continue to have sex  unless your health care provider directs you otherwise. Relief of Pain or Discomfort Wear a good support bra for breast tenderness.   Take warm sitz baths to soothe any pain or discomfort caused by hemorrhoids. Use hemorrhoid cream if your health care provider approves.   Rest with your legs elevated if you have leg cramps or low back pain. If you develop varicose veins in your legs, wear support hose. Elevate your feet for 15 minutes, 3-4 times a day. Limit salt in your diet. Prenatal Care Schedule your prenatal visits by the  twelfth week of pregnancy. They are usually scheduled monthly at first, then more often in the last 2 months before delivery. Write down your questions. Take them to your prenatal visits. Keep all your prenatal visits as directed by your health care provider. Safety Wear your seat belt at all times when driving. Make a list of emergency phone numbers, including numbers for family, friends, the hospital, and police and fire departments. General Tips Ask your health care provider for a referral to a local prenatal education class. Begin classes no later than at the beginning of month 6 of your pregnancy. Ask for help if you have counseling or nutritional needs during pregnancy. Your health care provider can offer advice or refer you to specialists for help with various needs. Do not use hot tubs, steam rooms, or saunas. Do not douche or use tampons or scented sanitary pads. Do not cross your legs for long periods of time. Avoid cat litter boxes and soil used by cats. These carry germs that can cause birth defects in the baby and possibly loss of the fetus by miscarriage or stillbirth. Avoid all smoking, herbs, alcohol, and medicines not prescribed by your health care provider. Chemicals in these affect the formation and growth of the baby. Schedule a dentist appointment. At home, brush your teeth with a soft toothbrush and be gentle when you floss. SEEK MEDICAL CARE IF:   You have dizziness. You have mild pelvic cramps, pelvic pressure, or nagging pain in the abdominal area. You have persistent nausea, vomiting, or diarrhea. You have a bad smelling vaginal discharge. You have pain with urination. You notice increased swelling in your face, hands, legs, or ankles. SEEK IMMEDIATE MEDICAL CARE IF:  You have a fever. You are leaking fluid from your vagina. You have spotting or bleeding from your vagina. You have severe abdominal cramping or pain. You have rapid weight gain or loss. You vomit blood or material that looks like coffee grounds. You are exposed to [place] measles and have never had them. You are exposed to fifth disease or chickenpox. You develop a severe headache. You have shortness of breath. You have any kind of trauma, such as from a fall or a car accident. Document Released: 04/30/2001 Document Revised: 09/20/2013 Document Reviewed: 03/16/2013 [facility] Patient Information 2015 [place], [place]. This information is not intended to replace advice given to you by your health care provider. Make sure you discuss any questions you have with your health care provider.

## 2022-06-10 NOTE — Progress Notes (Addendum)
INITIAL OBSTETRICAL VISIT Patient name: Jacqueline Barrera MRN [id number]  Date of birth: 1995-11-17 Chief Complaint:   Initial Prenatal Visit  History of Present Illness:   Jacqueline Barrera is a 27 y.o. [id number] African-American female at [redacted]w[redacted]d by [place] at 10 weeks with an Estimated Date of Delivery: 12/20/22 being seen today for her initial obstetrical visit.   Patient's last menstrual period was 03/23/2022 (approximate). Her obstetrical history is significant for  EAB x 2, then 32wk IUFD, severe pre-e and abruption; 36.3wk PTB d/t spontaneous labor, no problems w/ HTN .   Today she reports headaches,  N/V, phenergan doesn't help. Smoking THC to help w/ nausea and appetite  Mild dep/anx, denies SI, thinks it's from n/v. Declines therapy/IBH/meds.  Last pap 06/01/18. Results were: NILM w/ HRHPV not done     06/10/2022    3:07 PM 06/01/2018   10:31 AM 06/10/2017    9:05 AM 08/28/2016    2:02 PM  Depression screen PHQ 2/9  Decreased Interest 2 0 2 1  Down, Depressed, Hopeless 2 0 0 0  PHQ - 2 Score 4 0 2 1  Altered sleeping 3  3 3   Tired, decreased energy 3  3 2   Change in appetite 3  0 2  Feeling bad or failure about yourself  0  0 0  Trouble concentrating 0  0 0  Moving slowly or fidgety/restless 0  0 0  Suicidal thoughts 0  0 0  PHQ-9 Score 13  8 8         06/10/2022    3:08 PM  GAD 7 : Generalized Anxiety Score  Nervous, Anxious, on Edge 3  Control/stop worrying 3  Worry too much - different things 3  Trouble relaxing 1  Restless 1  Easily annoyed or irritable 3  Afraid - awful might happen 0  Total GAD 7 Score 14     Review of Systems:   Pertinent items are noted in HPI Denies cramping/contractions, leakage of fluid, vaginal bleeding, abnormal vaginal discharge w/ itching/odor/irritation, headaches, visual changes, shortness of breath, chest pain, abdominal pain, severe nausea/vomiting, or problems with urination or bowel movements unless otherwise stated above.  Pertinent  History Reviewed:  Reviewed past medical,surgical, social, obstetrical and family history.  Reviewed problem list, medications and allergies. OB History  Gravida Para Term Preterm AB Living  5 2   2 2 1   SAB IAB Ectopic Multiple Live Births  0 2   0 1    # Outcome Date GA Lbr Len/2nd Weight Sex Delivery Anes PTL Lv  5 Current           4 Preterm 11/20/17 [redacted]w[redacted]d 07:10 / 00:21 6 lb 4.4 oz (2.845 kg) M Vag-Spont EPI  LIV  3 Preterm 02/04/17 [redacted]w[redacted]d 08:21 / 00:23 3 lb 2.6 oz (1.435 kg) F Vag-Spont EPI N FD     Complications: Severe pre-eclampsia, Placental abruption  2 IAB           1 IAB            Physical Assessment:   Vitals:   06/10/22 1511  BP: 129/72  Pulse: (!) 103  Weight: 172 lb 9.6 oz (78.3 kg)  Body mass index is 29.63 kg/m.       Physical Examination:  General appearance - well appearing, and in no distress  Mental status - alert, oriented to person, place, and time  Psych:  She has a normal mood and affect  Skin - warm and  dry, normal color, no suspicious lesions noted  Chest - effort normal, all lung fields clear to auscultation bilaterally  Heart - normal rate and regular rhythm  Abdomen - soft, nontender  Extremities:  No swelling or varicosities noted  Thin prep pap is not done today- needs but has son w/ her, wants to do next visit  Chaperone: N/A    TODAY'S FHR: 152 via doppler  No results found for this or any previous visit (from the past 24 hour(s)).  Assessment & Plan:  1) Low-Risk Pregnancy [id number] at [redacted]w[redacted]d with an Estimated Date of Delivery: 12/20/22   2) Initial OB visit  3) H/O 32wk IUFD d/t severe pre-e, abruption  4) H/O 36wk PTB> spontaneous labor, offered prometrium, wants- rx sent  5) H/O severe pre-e> baseline labs today, ASA 162mg   6) Due for pap> wants next visit, has son w/ her today  7) N/V> rx diclegis, if not helping let us know  8) THC use> to help w/ n/v, appetite. Counseled and advised cessation  9) Dep/anx> mild, thinks is  from n/v, declines IBH/therapy/meds, let us know if changes mind  10) Headaches> gave printed prevention/relief measures   Meds:  Meds ordered this encounter  Medications   Blood Pressure Monitor MISC    Sig: For regular home bp monitoring during pregnancy    Dispense:  1 each    Refill:  0    Dx: z34.90   Doxylamine-Pyridoxine (DICLEGIS) 10-10 MG TBEC    Sig: 2 tabs q hs, if sx persist add 1 tab q am on day 3, if sx persist add 1 tab q afternoon on day 4    Dispense:  100 tablet    Refill:  6   progesterone (PROMETRIUM) 200 MG capsule    Sig: Place 1 capsule (200 mg total) vaginally at bedtime.    Dispense:  30 capsule    Refill:  5   aspirin EC 81 MG tablet    Sig: Take 2 tablets (162 mg total) by mouth daily. Swallow whole.    Dispense:  180 tablet    Refill:  2    Initial labs obtained Continue prenatal vitamins Reviewed n/v relief measures and warning s/s to report Reviewed recommended weight gain based on pre-gravid BMI Encouraged well-balanced diet Genetic & carrier screening discussed: declines Panorama, NT/IT, AFP, and Horizon  Ultrasound discussed; fetal survey: requested CCNC completed> form faxed if has or is planning to apply for medicaid The nature of [employer] for [employer] with multiple MDs and other Advanced Practice Providers was explained to patient; also emphasized that fellows, residents, and students are part of our team. Does not have home bp cuff. Office bp cuff given: no. Rx sent: yes. Check bp weekly, let us know if consistently >140/90.   Follow-up: Return in about 4 weeks (around 07/08/2022) for LROB, CNM, in person; then 7wks from now for anatomy u/s and LROB w/ CNM.   Orders Placed This Encounter  Procedures   Urine Culture   GC/Chlamydia Probe Amp   CBC/D/Plt+RPR+Rh+ABO+RubIgG...   Protein / creatinine ratio, urine   Comp Met (CMET)    Roma Schanz CNM, [facility] 06/10/2022 3:51 PM

## 2022-06-11 LAB — CBC/D/PLT+RPR+RH+ABO+RUBIGG...
Antibody Screen: NEGATIVE
Basophils Absolute: 0 10*3/uL (ref 0.0–0.2)
Basos: 1 %
EOS (ABSOLUTE): 0 10*3/uL (ref 0.0–0.4)
Eos: 1 %
HCV Ab: NONREACTIVE
HIV Screen 4th Generation wRfx: NONREACTIVE
Hematocrit: 33.8 % — ABNORMAL LOW (ref 34.0–46.6)
Hemoglobin: 11.6 g/dL (ref 11.1–15.9)
Hepatitis B Surface Ag: NEGATIVE
Immature Grans (Abs): 0 10*3/uL (ref 0.0–0.1)
Immature Granulocytes: 0 %
Lymphocytes Absolute: 1.6 10*3/uL (ref 0.7–3.1)
Lymphs: 30 %
MCH: 28.2 pg (ref 26.6–33.0)
MCHC: 34.3 g/dL (ref 31.5–35.7)
MCV: 82 fL (ref 79–97)
Monocytes Absolute: 0.4 10*3/uL (ref 0.1–0.9)
Monocytes: 7 %
Neutrophils Absolute: 3.3 10*3/uL (ref 1.4–7.0)
Neutrophils: 61 %
Platelets: 325 10*3/uL (ref 150–450)
RBC: 4.12 x10E6/uL (ref 3.77–5.28)
RDW: 12.9 % (ref 11.7–15.4)
RPR Ser Ql: NONREACTIVE
Rh Factor: POSITIVE
Rubella Antibodies, IGG: 2.37 index (ref >0.99)
WBC: 5.4 10*3/uL (ref 3.4–10.8)

## 2022-06-11 LAB — COMPREHENSIVE METABOLIC PANEL
ALT: 7 IU/L (ref 0–32)
AST: 11 IU/L (ref 0–40)
Albumin/Globulin Ratio: 1.8 (ref 1.2–2.2)
Albumin: 4.4 g/dL (ref 4.0–5.0)
Alkaline Phosphatase: 62 IU/L (ref 44–121)
BUN/Creatinine Ratio: 8 — ABNORMAL LOW (ref 9–23)
BUN: 5 mg/dL — ABNORMAL LOW (ref 6–20)
Bilirubin Total: 0.2 mg/dL (ref 0.0–1.2)
CO2: 21 mmol/L (ref 20–29)
Calcium: 9.6 mg/dL (ref 8.7–10.2)
Chloride: 101 mmol/L (ref 96–106)
Creatinine, Ser: 0.59 mg/dL (ref 0.57–1.00)
Globulin, Total: 2.5 g/dL (ref 1.5–4.5)
Glucose: 77 mg/dL (ref 70–99)
Potassium: 3.9 mmol/L (ref 3.5–5.2)
Sodium: 136 mmol/L (ref 134–144)
Total Protein: 6.9 g/dL (ref 6.0–8.5)
eGFR: 126 mL/min/{1.73_m2} (ref >59)

## 2022-06-11 LAB — PROTEIN / CREATININE RATIO, URINE
Creatinine, Urine: 252.8 mg/dL
Protein, Ur: 16.8 mg/dL
Protein/Creat Ratio: 66 mg/g creat (ref 0–200)

## 2022-06-11 LAB — HCV INTERPRETATION

## 2022-06-12 LAB — GC/CHLAMYDIA PROBE AMP
Chlamydia trachomatis, NAA: NEGATIVE
Neisseria Gonorrhoeae by PCR: NEGATIVE

## 2022-06-12 LAB — URINE CULTURE

## 2022-07-08 ENCOUNTER — Ambulatory Visit (INDEPENDENT_AMBULATORY_CARE_PROVIDER_SITE_OTHER): Payer: No Typology Code available for payment source | Admitting: Advanced Practice Midwife

## 2022-07-08 ENCOUNTER — Other Ambulatory Visit (HOSPITAL_COMMUNITY)
Admission: RE | Admit: 2022-07-08 | Discharge: 2022-07-08 | Disposition: A | Discharge status: Home / Self Care | Payer: No Typology Code available for payment source | Source: Ambulatory Visit | Attending: Advanced Practice Midwife | Admitting: Advanced Practice Midwife

## 2022-07-08 ENCOUNTER — Encounter: Payer: Self-pay | Admitting: Advanced Practice Midwife

## 2022-07-08 VITALS — BP 122/69 | HR 102 | Wt 177.0 lb

## 2022-07-08 DIAGNOSIS — Z3482 Encounter for supervision of other normal pregnancy, second trimester: Secondary | ICD-10-CM | POA: Insufficient documentation

## 2022-07-08 DIAGNOSIS — Z3A16 16 weeks gestation of pregnancy: Secondary | ICD-10-CM

## 2022-07-08 DIAGNOSIS — Z124 Encounter for screening for malignant neoplasm of cervix: Secondary | ICD-10-CM | POA: Diagnosis present

## 2022-07-08 DIAGNOSIS — Z3492 Encounter for supervision of normal pregnancy, unspecified, second trimester: Secondary | ICD-10-CM

## 2022-07-08 NOTE — Progress Notes (Signed)
   LOW-RISK PREGNANCY VISIT Patient name: Jacqueline Barrera MRN [id number]  Date of birth: 1995/11/18 Chief Complaint:   Routine Prenatal Visit (Lower abdominal pressure, increase in discharge)  History of Present Illness:   Jacqueline Barrera is a 27 y.o. [id number] female at 55w3dwith an Estimated Date of Delivery: 12/20/22 being seen today for ongoing management of a low-risk pregnancy.  Today she reports noticing some increased spot of DC a few times, also some pressure. Changed mind about prometrium.  Nausea has gone away w/o meds.. Contractions: Not present. Vag. Bleeding: None.  Movement: Present. denies leaking of fluid. Review of Systems:   Pertinent items are noted in HPI Denies abnormal vaginal discharge w/ itching/odor/irritation, headaches, visual changes, shortness of breath, chest pain, abdominal pain, severe nausea/vomiting, or problems with urination or bowel movements unless otherwise stated above. Pertinent History Reviewed:  Reviewed past medical,surgical, social, obstetrical and family history.  Reviewed problem list, medications and allergies. Physical Assessment:   Vitals:   07/08/22 1504  BP: 122/69  Pulse: (!) 102  Weight: 177 lb (80.3 kg)  Body mass index is 30.38 kg/m.        Physical Examination:   General appearance: Well appearing, and in no distress  Mental status: Alert, oriented to person, place, and time  Skin: Warm & dry  Cardiovascular: Normal heart rate noted  Respiratory: Normal respiratory effort, no distress  Abdomen: Soft, gravid, nontender  Pelvic: Cervical exam deferred  Pap smear collectedDilation: Closed Effacement (%): 0    Extremities: Edema: None  Fetal Status: Fetal Heart Rate (bpm): 148   Movement: Present    Chaperone:  N/A    No results found for this or any previous visit (from the past 24 hour(s)).  Assessment & Plan:    Pregnancy: [id number] 150w3d. [redacted] weeks gestation of pregnancy   2. Supervision of normal intrauterine pregnancy  in multigravida, second trimester  3.  Hx PTD--decided against prometrium  4);  N/V:  much improved     Meds: No orders of the defined types were placed in this encounter.  Labs/procedures today: none  Plan:  Continue routine obstetrical care  Next visit: prefers will be in person for anatomy scan     Reviewed: general obstetric precautions including but not limited to vaginal bleeding, contractions, leaking of fluid and fetal movement were reviewed in detail with the patient.  All questions were answered.    Follow-up: Return for As scheduled.  Future Appointments  Date Time Provider [place]3/03/2023  1:30 PM [facility] [place] None  07/29/2022  2:30 PM BoRoma SchanzCNM [facility]    No orders of the defined types were placed in this encounter.  FrChristin FudgeNP, CNM 07/08/2022 3:21 PM

## 2022-07-08 NOTE — Patient Instructions (Signed)
Jacqueline Barrera, I greatly value your feedback.  If you receive a survey following your visit with [place] today, we appreciate you taking the time to fill it out.  Thanks, Jacqueline Barrera, CNM     [place]!!! It is now [place] at [facility] ([place],  [address]) Entrance located off of [address] parking   Go to [email or web address] to register for FREE online childbirth classes    Second Trimester of Pregnancy The second trimester is from week 14 through week 27 (months 4 through 6). The second trimester is often a time when you feel your best. Your body has adjusted to being pregnant, and you begin to feel better physically. Usually, morning sickness has lessened or quit completely, you may have more energy, and you may have an increase in appetite. The second trimester is also a time when the fetus is growing rapidly. At the end of the sixth month, the fetus is about 9 inches long and weighs about 1 pounds. You will likely begin to feel the baby move (quickening) between 16 and 20 weeks of pregnancy. Body changes during your second trimester Your body continues to go through many changes during your second trimester. The changes vary from woman to woman. Your weight will continue to increase. You will notice your lower abdomen bulging out. You may begin to get stretch marks on your hips, abdomen, and breasts. You may develop headaches that can be relieved by medicines. The medicines should be approved by your health care provider. You may urinate more often because the fetus is pressing on your bladder. You may develop or continue to have heartburn as a result of your pregnancy. You may develop constipation because certain hormones are causing the muscles that push waste through your intestines to slow down. You may develop hemorrhoids or swollen, bulging veins (varicose veins). You may have back pain. This is  caused by: Weight gain. Pregnancy hormones that are relaxing the joints in your pelvis. A shift in weight and the muscles that support your balance. Your breasts will continue to grow and they will continue to become tender. Your gums may bleed and may be sensitive to brushing and flossing. Dark spots or blotches (chloasma, mask of pregnancy) may develop on your face. This will likely fade after the baby is born. A dark line from your belly button to the pubic area (linea nigra) may appear. This will likely fade after the baby is born. You may have changes in your hair. These can include thickening of your hair, rapid growth, and changes in texture. Some women also have hair loss during or after pregnancy, or hair that feels dry or thin. Your hair will most likely return to normal after your baby is born.  What to expect at prenatal visits During a routine prenatal visit: You will be weighed to make sure you and the fetus are growing normally. Your blood pressure will be taken. Your abdomen will be measured to track your baby's growth. The fetal heartbeat will be listened to. Any test results from the previous visit will be discussed.  Your health care provider may ask you: How you are feeling. If you are feeling the baby move. If you have had any abnormal symptoms, such as leaking fluid, bleeding, severe headaches, or abdominal cramping. If you are using any tobacco products, including cigarettes, chewing tobacco, and electronic cigarettes. If you have any questions.  Other tests that  may be performed during your second trimester include: Blood tests that check for: Low iron levels (anemia). High blood sugar that affects pregnant women (gestational diabetes) between 45 and 28 weeks. Rh antibodies. This is to check for a protein on red blood cells (Rh factor). Urine tests to check for infections, diabetes, or protein in the urine. An ultrasound to confirm the proper growth and  development of the baby. An amniocentesis to check for possible genetic problems. Fetal screens for spina bifida and Down syndrome. HIV (human immunodeficiency virus) testing. Routine prenatal testing includes screening for HIV, unless you choose not to have this test.  Follow these instructions at home: Medicines Follow your health care provider's instructions regarding medicine use. Specific medicines may be either safe or unsafe to take during pregnancy. Take a prenatal vitamin that contains at least 600 micrograms (mcg) of folic acid. If you develop constipation, try taking a stool softener if your health care provider approves. Eating and drinking Eat a balanced diet that includes fresh fruits and vegetables, whole grains, good sources of protein such as meat, eggs, or tofu, and low-fat dairy. Your health care provider will help you determine the amount of weight gain that is right for you. Avoid raw meat and uncooked cheese. These carry germs that can cause birth defects in the baby. If you have low calcium intake from food, talk to your health care provider about whether you should take a daily calcium supplement. Limit foods that are high in fat and processed sugars, such as fried and sweet foods. To prevent constipation: Drink enough fluid to keep your urine clear or pale yellow. Eat foods that are high in fiber, such as fresh fruits and vegetables, whole grains, and beans. Activity Exercise only as directed by your health care provider. Most women can continue their usual exercise routine during pregnancy. Try to exercise for 30 minutes at least 5 days a week. Stop exercising if you experience uterine contractions. Avoid heavy lifting, wear low heel shoes, and practice good posture. A sexual relationship may be continued unless your health care provider directs you otherwise. Relieving pain and discomfort Wear a good support bra to prevent discomfort from breast tenderness. Take  warm sitz baths to soothe any pain or discomfort caused by hemorrhoids. Use hemorrhoid cream if your health care provider approves. Rest with your legs elevated if you have leg cramps or low back pain. If you develop varicose veins, wear support hose. Elevate your feet for 15 minutes, 3-4 times a day. Limit salt in your diet. Prenatal Care Write down your questions. Take them to your prenatal visits. Keep all your prenatal visits as told by your health care provider. This is important. Safety Wear your seat belt at all times when driving. Make a list of emergency phone numbers, including numbers for family, friends, the hospital, and police and fire departments. General instructions Ask your health care provider for a referral to a local prenatal education class. Begin classes no later than the beginning of month 6 of your pregnancy. Ask for help if you have counseling or nutritional needs during pregnancy. Your health care provider can offer advice or refer you to specialists for help with various needs. Do not use hot tubs, steam rooms, or saunas. Do not douche or use tampons or scented sanitary pads. Do not cross your legs for long periods of time. Avoid cat litter boxes and soil used by cats. These carry germs that can cause birth defects in the baby and  possibly loss of the fetus by miscarriage or stillbirth. Avoid all smoking, herbs, alcohol, and unprescribed drugs. Chemicals in these products can affect the formation and growth of the baby. Do not use any products that contain nicotine or tobacco, such as cigarettes and e-cigarettes. If you need help quitting, ask your health care provider. Visit your dentist if you have not gone yet during your pregnancy. Use a soft toothbrush to brush your teeth and be gentle when you floss. Contact a health care provider if: You have dizziness. You have mild pelvic cramps, pelvic pressure, or nagging pain in the abdominal area. You have persistent  nausea, vomiting, or diarrhea. You have a bad smelling vaginal discharge. You have pain when you urinate. Get help right away if: You have a fever. You are leaking fluid from your vagina. You have spotting or bleeding from your vagina. You have severe abdominal cramping or pain. You have rapid weight gain or weight loss. You have shortness of breath with chest pain. You notice sudden or extreme swelling of your face, hands, ankles, feet, or legs. You have not felt your baby move in over an hour. You have severe headaches that do not go away when you take medicine. You have vision changes. Summary The second trimester is from week 14 through week 27 (months 4 through 6). It is also a time when the fetus is growing rapidly. Your body goes through many changes during pregnancy. The changes vary from woman to woman. Avoid all smoking, herbs, alcohol, and unprescribed drugs. These chemicals affect the formation and growth your baby. Do not use any tobacco products, such as cigarettes, chewing tobacco, and e-cigarettes. If you need help quitting, ask your health care provider. Contact your health care provider if you have any questions. Keep all prenatal visits as told by your health care provider. This is important. This information is not intended to replace advice given to you by your health care provider. Make sure you discuss any questions you have with your health care provider.

## 2022-07-11 LAB — CYTOLOGY - PAP
Comment: NEGATIVE
Diagnosis: NEGATIVE
High risk HPV: NEGATIVE

## 2022-07-26 ENCOUNTER — Other Ambulatory Visit: Payer: Self-pay | Admitting: Women's Health

## 2022-07-26 DIAGNOSIS — Z363 Encounter for antenatal screening for malformations: Secondary | ICD-10-CM

## 2022-07-29 ENCOUNTER — Encounter: Payer: Self-pay | Admitting: Women's Health

## 2022-07-29 ENCOUNTER — Ambulatory Visit (INDEPENDENT_AMBULATORY_CARE_PROVIDER_SITE_OTHER): Payer: No Typology Code available for payment source

## 2022-07-29 ENCOUNTER — Ambulatory Visit (INDEPENDENT_AMBULATORY_CARE_PROVIDER_SITE_OTHER): Payer: No Typology Code available for payment source | Admitting: Women's Health

## 2022-07-29 VITALS — BP 134/67 | HR 102 | Wt 185.6 lb

## 2022-07-29 DIAGNOSIS — Z8759 Personal history of other complications of pregnancy, childbirth and the puerperium: Secondary | ICD-10-CM

## 2022-07-29 DIAGNOSIS — Z3A19 19 weeks gestation of pregnancy: Secondary | ICD-10-CM

## 2022-07-29 DIAGNOSIS — O0992 Supervision of high risk pregnancy, unspecified, second trimester: Secondary | ICD-10-CM

## 2022-07-29 DIAGNOSIS — Z363 Encounter for antenatal screening for malformations: Secondary | ICD-10-CM

## 2022-07-29 DIAGNOSIS — O35EXX0 Maternal care for other (suspected) fetal abnormality and damage, fetal genitourinary anomalies, not applicable or unspecified: Secondary | ICD-10-CM

## 2022-07-29 NOTE — Progress Notes (Signed)
[place] 19+3 wks,breech,fundal placenta gr 0,normal ovaries,SVP of fluid 6.5 cm,FHR 157 bpm,bilateral renal pelvic dilatation, RK 4.4 mm,LK 7.7 mm,EFW 289 g 41%,anatomy complete

## 2022-07-29 NOTE — Progress Notes (Addendum)
HIGH-RISK PREGNANCY VISIT Patient name: Jacqueline Barrera MRN [id number]  Date of birth: 1995/10/01 Chief Complaint:   Routine Prenatal Visit (Anatomy scan)  History of Present Illness:   Jacqueline Barrera is a 27 y.o. [id number] female at 12w3dwith an Estimated Date of Delivery: 12/20/22 being seen today for ongoing management of a high-risk pregnancy complicated by history of intrauterine fetal demise and history of severe pre-eclampsia, h/o 36wk PTB.    Today she reports no complaints. Checks home bp's weekly, all wnl.  Contractions: Not present. Vag. Bleeding: None.  Movement: Present. denies leaking of fluid.      06/10/2022    3:07 PM 06/01/2018   10:31 AM 06/10/2017    9:05 AM 08/28/2016    2:02 PM  Depression screen PHQ 2/9  Decreased Interest 2 0 2 1  Down, Depressed, Hopeless 2 0 0 0  PHQ - 2 Score 4 0 2 1  Altered sleeping '3  3 3  '$ Tired, decreased energy '3  3 2  '$ Change in appetite 3  0 2  Feeling bad or failure about yourself  0  0 0  Trouble concentrating 0  0 0  Moving slowly or fidgety/restless 0  0 0  Suicidal thoughts 0  0 0  PHQ-9 Score '13  8 8        '$ 06/10/2022    3:08 PM  GAD 7 : Generalized Anxiety Score  Nervous, Anxious, on Edge 3  Control/stop worrying 3  Worry too much - different things 3  Trouble relaxing 1  Restless 1  Easily annoyed or irritable 3  Afraid - awful might happen 0  Total GAD 7 Score 14     Review of Systems:   Pertinent items are noted in HPI Denies abnormal vaginal discharge w/ itching/odor/irritation, headaches, visual changes, shortness of breath, chest pain, abdominal pain, severe nausea/vomiting, or problems with urination or bowel movements unless otherwise stated above. Pertinent History Reviewed:  Reviewed past medical,surgical, social, obstetrical and family history.  Reviewed problem list, medications and allergies. Physical Assessment:   Vitals:   07/29/22 1416  BP: 134/67  Pulse: (!) 102  Weight: 185 lb 9.6 oz (84.2 kg)   Body mass index is 31.86 kg/m.           Physical Examination:   General appearance: alert, well appearing, and in no distress  Mental status: alert, oriented to person, place, and time  Skin: warm & dry   Extremities: Edema: None    Cardiovascular: normal heart rate noted  Respiratory: normal respiratory effort, no distress  Abdomen: gravid, soft, non-tender  Pelvic: Cervical exam deferred         Fetal Status:     Movement: Present    Fetal Surveillance Testing today: [place]19+3 wks,breech,fundal placenta gr 0,normal ovaries,SVP of fluid 6.5 cm,FHR 157 bpm,bilateral renal pelvic dilatation, RK 4.4 mm,LK 7.7 mm,EFW 289 g 41%,anatomy complete   Chaperone: N/A    No results found for this or any previous visit (from the past 24 hour(s)).  Assessment & Plan:  High-risk pregnancy: [id number] 189w3dith an Estimated Date of Delivery: 12/20/22   1) H/O 32wk IUFD/severe pre-e/abruption> ASA, reviewed pre-e s/s, reasons to seek care  2) H/O 36wk PTB, initially decided for prometrium, but changed her mind  3) Fetal bilateral RPD> declined genetic screening earlier in pregnancy, declines today. Discussed and gave printed info  Meds: No orders of the defined types were placed in this encounter.   Labs/procedures today: U/S  Treatment Plan:  U/S 24, 28, 45, 36wks      2x/wk nst or weekly bpp @ 32wks      Deliver @ 39wks (37-38wks if h/o abruption or severe maternal anxiety, or per MFM):____   Reviewed: Preterm labor symptoms and general obstetric precautions including but not limited to vaginal bleeding, contractions, leaking of fluid and fetal movement were reviewed in detail with the patient.  All questions were answered. Does have home bp cuff. Office bp cuff given: not applicable. Check bp weekly, let us know if consistently >140 and/or >90.  Follow-up: Return in about 4 weeks (around 08/26/2022) for [place], US:EFW, MD or CNM, in person.   Future Appointments  Date Time Provider  [place]  08/26/2022  2:15 PM [facility] [place] [facility] None  08/26/2022  3:10 PM Eure, Mertie Clause, MD [facility]    Orders Placed This Encounter  Procedures   US OB Follow Up   Roma Schanz CNM, [facility] 07/29/2022 2:55 PM

## 2022-07-29 NOTE — Addendum Note (Signed)
Addended by: Roma Schanz on: 07/29/2022 02:55 PM   Modules accepted: Orders

## 2022-07-29 NOTE — Patient Instructions (Signed)
Jacqueline Barrera, thank you for choosing our office today! We appreciate the opportunity to meet your healthcare needs. You may receive a short survey by mail, e-mail, or through [employer]. If you are happy with your care we would appreciate if you could take just a few minutes to complete the survey questions. We read all of your comments and take your feedback very seriously. Thank you again for choosing our office.  Center for [employer] Team at [place] at [facility] ([place], [place] [address]) Entrance C, located off of [place] parking  Go to [email or web address] to register for FREE online childbirth classes  Call the office [phone]) or go to [facility] if: You begin to severe cramping Your water breaks.  Sometimes it is a big gush of fluid, sometimes it is just a trickle that keeps getting your panties wet or running down your legs You have vaginal bleeding.  It is normal to have a small amount of spotting if your cervix was checked.   [facility] Pediatricians/Family Doctors [facility]): [address] [place], [place] [facility]: [address] [place], [phone]                [facility]): [place], [phone] (call to ask if accepting patients) [facility] Department: [address], [place], [place] Pediatricians/Family Doctors [facility]): [place]. [place], [address], [facility]: [address] [place], [place] [facility]: [place], [place] [facility]): [phone] [facility]: [address] [place]: [address] [place] [facility]: [phone], [phone]  Home Blood Pressure Monitoring for Patients   Your provider has recommended that you check your blood pressure (BP) at least once a week at home. If you do not have a blood pressure cuff at home, one will be provided for you. Contact your provider if you have not received your monitor within 1 week.   Helpful Tips for Accurate Home Blood Pressure Checks  Don't smoke, exercise, or drink caffeine 30 minutes before checking your BP Use the restroom before checking your BP (a full bladder can raise your pressure) Relax in a comfortable upright chair Feet on the ground Left arm resting comfortably on a flat surface at the level of your heart Legs uncrossed Back supported Sit quietly and don't talk Place the cuff on your bare arm Adjust snuggly, so that only two fingertips can fit between your skin and the top of the cuff Check 2 readings separated by at least one minute Keep a log of your BP readings For a visual, please reference this diagram: http://ccnc.care/bpdiagram  Provider Name: [facility]     Phone: [phone]  Zone 1: ALL CLEAR  Continue to monitor your symptoms:  BP reading is less than 140 (top number) or less than 90 (bottom number)  No right upper stomach pain No headaches or seeing spots No feeling nauseated or throwing up No swelling in face and hands  Zone 2: CAUTION Call your doctor's office for any of the following:  BP reading is greater than 140 (top number) or greater than  90 (bottom number)  Stomach pain under your ribs in the middle or right side Headaches or seeing spots Feeling nauseated or throwing up Swelling in face and hands  Zone 3: EMERGENCY  Seek immediate medical care if you have any of the following:  BP reading is greater than160 (top number) or greater than 110 (bottom number) Severe headaches not improving with Tylenol Serious difficulty catching your breath Any worsening symptoms from  Zone 2     Second Trimester of Pregnancy The second trimester is from week 14 through week 27 (months 4 through 6). The second trimester is often a time when you feel your best. Your body has adjusted to being pregnant, and you begin to feel better physically. Usually, morning sickness has lessened or quit completely, you may have more energy, and you may have an increase in appetite. The second trimester is also a time when the fetus is growing rapidly. At the end of the sixth month, the fetus is about 9 inches long and weighs about 1 pounds. You will likely begin to feel the baby move (quickening) between 16 and 20 weeks of pregnancy. Body changes during your second trimester Your body continues to go through many changes during your second trimester. The changes vary from woman to woman. Your weight will continue to increase. You will notice your lower abdomen bulging out. You may begin to get stretch marks on your hips, abdomen, and breasts. You may develop headaches that can be relieved by medicines. The medicines should be approved by your health care provider. You may urinate more often because the fetus is pressing on your bladder. You may develop or continue to have heartburn as a result of your pregnancy. You may develop constipation because certain hormones are causing the muscles that push waste through your intestines to slow down. You may develop hemorrhoids or swollen, bulging veins (varicose veins). You may have back pain. This is caused by: Weight gain. Pregnancy hormones that are relaxing the joints in your pelvis. A shift in weight and the muscles that support your balance. Your breasts will continue to grow and they will continue to become tender. Your gums may bleed and may be sensitive to brushing and flossing. Dark spots or blotches (chloasma, mask of pregnancy) may develop on your face. This will likely fade after the baby is born. A dark line from your belly button to  the pubic area (linea nigra) may appear. This will likely fade after the baby is born. You may have changes in your hair. These can include thickening of your hair, rapid growth, and changes in texture. Some women also have hair loss during or after pregnancy, or hair that feels dry or thin. Your hair will most likely return to normal after your baby is born.  What to expect at prenatal visits During a routine prenatal visit: You will be weighed to make sure you and the fetus are growing normally. Your blood pressure will be taken. Your abdomen will be measured to track your baby's growth. The fetal heartbeat will be listened to. Any test results from the previous visit will be discussed.  Your health care provider may ask you: How you are feeling. If you are feeling the baby move. If you have had any abnormal symptoms, such as leaking fluid, bleeding, severe headaches, or abdominal cramping. If you are using any tobacco products, including cigarettes, chewing tobacco, and electronic cigarettes. If you have any questions.  Other tests that may be performed during   your second trimester include: Blood tests that check for: Low iron levels (anemia). High blood sugar that affects pregnant women (gestational diabetes) between 24 and 28 weeks. Rh antibodies. This is to check for a protein on red blood cells (Rh factor). Urine tests to check for infections, diabetes, or protein in the urine. An ultrasound to confirm the proper growth and development of the baby. An amniocentesis to check for possible genetic problems. Fetal screens for spina bifida and Down syndrome. HIV (human immunodeficiency virus) testing. Routine prenatal testing includes screening for HIV, unless you choose not to have this test.  Follow these instructions at home: Medicines Follow your health care provider's instructions regarding medicine use. Specific medicines may be either safe or unsafe to take during  pregnancy. Take a prenatal vitamin that contains at least 600 micrograms (mcg) of folic acid. If you develop constipation, try taking a stool softener if your health care provider approves. Eating and drinking Eat a balanced diet that includes fresh fruits and vegetables, whole grains, good sources of protein such as meat, eggs, or tofu, and low-fat dairy. Your health care provider will help you determine the amount of weight gain that is right for you. Avoid raw meat and uncooked cheese. These carry germs that can cause birth defects in the baby. If you have low calcium intake from food, talk to your health care provider about whether you should take a daily calcium supplement. Limit foods that are high in fat and processed sugars, such as fried and sweet foods. To prevent constipation: Drink enough fluid to keep your urine clear or pale yellow. Eat foods that are high in fiber, such as fresh fruits and vegetables, whole grains, and beans. Activity Exercise only as directed by your health care provider. Most women can continue their usual exercise routine during pregnancy. Try to exercise for 30 minutes at least 5 days a week. Stop exercising if you experience uterine contractions. Avoid heavy lifting, wear low heel shoes, and practice good posture. A sexual relationship may be continued unless your health care provider directs you otherwise. Relieving pain and discomfort Wear a good support bra to prevent discomfort from breast tenderness. Take warm sitz baths to soothe any pain or discomfort caused by hemorrhoids. Use hemorrhoid cream if your health care provider approves. Rest with your legs elevated if you have leg cramps or low back pain. If you develop varicose veins, wear support hose. Elevate your feet for 15 minutes, 3-4 times a day. Limit salt in your diet. Prenatal Care Write down your questions. Take them to your prenatal visits. Keep all your prenatal visits as told by your health  care provider. This is important. Safety Wear your seat belt at all times when driving. Make a list of emergency phone numbers, including numbers for family, friends, the hospital, and police and fire departments. General instructions Ask your health care provider for a referral to a local prenatal education class. Begin classes no later than the beginning of month 6 of your pregnancy. Ask for help if you have counseling or nutritional needs during pregnancy. Your health care provider can offer advice or refer you to specialists for help with various needs. Do not use hot tubs, steam rooms, or saunas. Do not douche or use tampons or scented sanitary pads. Do not cross your legs for long periods of time. Avoid cat litter boxes and soil used by cats. These carry germs that can cause birth defects in the baby and possibly loss of the   fetus by miscarriage or stillbirth. Avoid all smoking, herbs, alcohol, and unprescribed drugs. Chemicals in these products can affect the formation and growth of the baby. Do not use any products that contain nicotine or tobacco, such as cigarettes and e-cigarettes. If you need help quitting, ask your health care provider. Visit your dentist if you have not gone yet during your pregnancy. Use a soft toothbrush to brush your teeth and be gentle when you floss. Contact a health care provider if: You have dizziness. You have mild pelvic cramps, pelvic pressure, or nagging pain in the abdominal area. You have persistent nausea, vomiting, or diarrhea. You have a bad smelling vaginal discharge. You have pain when you urinate. Get help right away if: You have a fever. You are leaking fluid from your vagina. You have spotting or bleeding from your vagina. You have severe abdominal cramping or pain. You have rapid weight gain or weight loss. You have shortness of breath with chest pain. You notice sudden or extreme swelling of your face, hands, ankles, feet, or legs. You  have not felt your baby move in over an hour. You have severe headaches that do not go away when you take medicine. You have vision changes. Summary The second trimester is from week 14 through week 27 (months 4 through 6). It is also a time when the fetus is growing rapidly. Your body goes through many changes during pregnancy. The changes vary from woman to woman. Avoid all smoking, herbs, alcohol, and unprescribed drugs. These chemicals affect the formation and growth your baby. Do not use any tobacco products, such as cigarettes, chewing tobacco, and e-cigarettes. If you need help quitting, ask your health care provider. Contact your health care provider if you have any questions. Keep all prenatal visits as told by your health care provider. This is important. This information is not intended to replace advice given to you by your health care provider. Make sure you discuss any questions you have with your health care provider. Document Released: 04/30/2001 Document Revised: 10/12/2015 Document Reviewed: 07/07/2012 Elsevier Interactive Patient Education  2017 [employer].

## 2022-08-26 ENCOUNTER — Ambulatory Visit: Payer: No Typology Code available for payment source

## 2022-08-26 ENCOUNTER — Encounter: Payer: Self-pay | Admitting: Obstetrics & Gynecology

## 2022-08-26 ENCOUNTER — Ambulatory Visit (INDEPENDENT_AMBULATORY_CARE_PROVIDER_SITE_OTHER): Payer: No Typology Code available for payment source | Admitting: Obstetrics & Gynecology

## 2022-08-26 VITALS — BP 132/72 | HR 104 | Wt 191.4 lb

## 2022-08-26 DIAGNOSIS — Z3A23 23 weeks gestation of pregnancy: Secondary | ICD-10-CM

## 2022-08-26 DIAGNOSIS — O0992 Supervision of high risk pregnancy, unspecified, second trimester: Secondary | ICD-10-CM | POA: Diagnosis not present

## 2022-08-26 DIAGNOSIS — O35EXX0 Maternal care for other (suspected) fetal abnormality and damage, fetal genitourinary anomalies, not applicable or unspecified: Secondary | ICD-10-CM

## 2022-08-26 DIAGNOSIS — Z8759 Personal history of other complications of pregnancy, childbirth and the puerperium: Secondary | ICD-10-CM | POA: Diagnosis not present

## 2022-08-26 DIAGNOSIS — O09292 Supervision of pregnancy with other poor reproductive or obstetric history, second trimester: Secondary | ICD-10-CM

## 2022-08-26 DIAGNOSIS — O09299 Supervision of pregnancy with other poor reproductive or obstetric history, unspecified trimester: Secondary | ICD-10-CM

## 2022-08-26 NOTE — Progress Notes (Signed)
HIGH-RISK PREGNANCY VISIT Patient name: Jacqueline Barrera MRN [id number]  Date of birth: [birth year] Chief Complaint:   Routine Prenatal Visit and Pregnancy Ultrasound  History of Present Illness:   Jacqueline Barrera is a 27 y.o. [id number] female at [redacted]w[redacted]d with an Estimated Date of Delivery: 12/20/22 being seen today for ongoing management of a high-risk pregnancy complicated by history of  severe pre eclampsia, IUFD.    Today she reports no complaints. Contractions: Not present.  .  Movement: Present. denies leaking of fluid.      06/10/2022    3:07 PM 06/01/2018   10:31 AM 06/10/2017    9:05 AM 08/28/2016    2:02 PM  Depression screen PHQ 2/9  Decreased Interest 2 0 2 1  Down, Depressed, Hopeless 2 0 0 0  PHQ - 2 Score 4 0 2 1  Altered sleeping 3  3 3   Tired, decreased energy 3  3 2   Change in appetite 3  0 2  Feeling bad or failure about yourself  0  0 0  Trouble concentrating 0  0 0  Moving slowly or fidgety/restless 0  0 0  Suicidal thoughts 0  0 0  PHQ-9 Score 13  8 8         06/10/2022    3:08 PM  GAD 7 : Generalized Anxiety Score  Nervous, Anxious, on Edge 3  Control/stop worrying 3  Worry too much - different things 3  Trouble relaxing 1  Restless 1  Easily annoyed or irritable 3  Afraid - awful might happen 0  Total GAD 7 Score 14     Review of Systems:   Pertinent items are noted in HPI Denies abnormal vaginal discharge w/ itching/odor/irritation, headaches, visual changes, shortness of breath, chest pain, abdominal pain, severe nausea/vomiting, or problems with urination or bowel movements unless otherwise stated above. Pertinent History Reviewed:  Reviewed past medical,surgical, social, obstetrical and family history.  Reviewed problem list, medications and allergies. Physical Assessment:   Vitals:   08/26/22 1503  BP: 132/72  Pulse: (!) 104  Weight: 191 lb 6.4 oz (86.8 kg)  Body mass index is 32.85 kg/m.           Physical Examination:   General appearance:  alert, well appearing, and in no distress  Mental status: alert, oriented to person, place, and time  Skin: warm & dry   Extremities: Edema: Trace    Cardiovascular: normal heart rate noted  Respiratory: normal respiratory effort, no distress  Abdomen: gravid, soft, non-tender  Pelvic: Cervical exam deferred         Fetal Status:     Movement: Present    Fetal Surveillance Testing today: [place]   Chaperone:     No results found for this or any previous visit (from the past 24 hour(s)).  Assessment & Plan:  High-risk pregnancy: [id number] at 110w3d with an Estimated Date of Delivery: 12/20/22      ICD-10-CM   1. Supervision of high risk pregnancy in second trimester  O09.92     2. History of IUFD  Z87.59     3. H/O preeclampsia  O09.299         Meds: No orders of the defined types were placed in this encounter.   Orders: No orders of the defined types were placed in this encounter.    Labs/procedures today: U/S  Treatment Plan:  PN2 next visit  Reviewed:   Follow-up: Return in about 4 weeks (around 09/23/2022) for PN2.  No future appointments.  No orders of the defined types were placed in this encounter.  Lazaro Arms  Attending Physician for the Center for [facility] 08/26/2022 3:44 PM

## 2022-08-26 NOTE — Progress Notes (Signed)
[place] 23+3 wks,cephalic,cx 2.8 cm,fundal placenta gr 0,normal ovaries,SVP of fluid 5 cm,left renal pelvic dilatation 7.7 mm with dilated ureter,RK 3.9 mm,WNL,EFW 582 g 36%

## 2022-09-24 ENCOUNTER — Other Ambulatory Visit: Payer: No Typology Code available for payment source

## 2022-09-24 ENCOUNTER — Ambulatory Visit: Payer: No Typology Code available for payment source | Admitting: Obstetrics & Gynecology

## 2022-09-24 ENCOUNTER — Encounter: Payer: Self-pay | Admitting: Obstetrics & Gynecology

## 2022-09-24 VITALS — BP 125/77 | HR 118 | Wt 192.0 lb

## 2022-09-24 DIAGNOSIS — O0992 Supervision of high risk pregnancy, unspecified, second trimester: Secondary | ICD-10-CM

## 2022-09-24 DIAGNOSIS — O09299 Supervision of pregnancy with other poor reproductive or obstetric history, unspecified trimester: Secondary | ICD-10-CM

## 2022-09-24 DIAGNOSIS — Z8759 Personal history of other complications of pregnancy, childbirth and the puerperium: Secondary | ICD-10-CM

## 2022-09-24 DIAGNOSIS — Z3A27 27 weeks gestation of pregnancy: Secondary | ICD-10-CM

## 2022-09-24 DIAGNOSIS — Q622 Congenital megaureter: Secondary | ICD-10-CM

## 2022-09-24 DIAGNOSIS — O09292 Supervision of pregnancy with other poor reproductive or obstetric history, second trimester: Secondary | ICD-10-CM

## 2022-09-24 DIAGNOSIS — Z131 Encounter for screening for diabetes mellitus: Secondary | ICD-10-CM

## 2022-09-24 NOTE — Progress Notes (Signed)
HIGH-RISK PREGNANCY VISIT Patient name: Jacqueline Barrera MRN [id number]  Date of birth: [birth year] Chief Complaint:   Routine Prenatal Visit  History of Present Illness:   Jacqueline Barrera is a 27 y.o. [id number] female at [redacted]w[redacted]d with an Estimated Date of Delivery: 12/20/22 being seen today for ongoing management of a high-risk pregnancy complicated by Hx of IUFD due to abruption, fetal bilateral RPD, ?congenital megaureter    Today she reports no complaints and pelvic pressure. Contractions: Not present. Vag. Bleeding: None.  Movement: Present. denies leaking of fluid.      06/10/2022    3:07 PM 06/01/2018   10:31 AM 06/10/2017    9:05 AM 08/28/2016    2:02 PM  Depression screen PHQ 2/9  Decreased Interest 2 0 2 1  Down, Depressed, Hopeless 2 0 0 0  PHQ - 2 Score 4 0 2 1  Altered sleeping 3  3 3   Tired, decreased energy 3  3 2   Change in appetite 3  0 2  Feeling bad or failure about yourself  0  0 0  Trouble concentrating 0  0 0  Moving slowly or fidgety/restless 0  0 0  Suicidal thoughts 0  0 0  PHQ-9 Score 13  8 8         06/10/2022    3:08 PM  GAD 7 : Generalized Anxiety Score  Nervous, Anxious, on Edge 3  Control/stop worrying 3  Worry too much - different things 3  Trouble relaxing 1  Restless 1  Easily annoyed or irritable 3  Afraid - awful might happen 0  Total GAD 7 Score 14     Review of Systems:   Pertinent items are noted in HPI Denies abnormal vaginal discharge w/ itching/odor/irritation, headaches, visual changes, shortness of breath, chest pain, abdominal pain, severe nausea/vomiting, or problems with urination or bowel movements unless otherwise stated above. Pertinent History Reviewed:  Reviewed past medical,surgical, social, obstetrical and family history.  Reviewed problem list, medications and allergies. Physical Assessment:   Vitals:   09/24/22 1050  BP: 125/77  Pulse: (!) 118  Weight: 192 lb (87.1 kg)  Body mass index is 32.96 kg/m.            Physical Examination:   General appearance: alert, well appearing, and in no distress  Mental status: alert, oriented to person, place, and time  Skin: warm & dry   Extremities:      Cardiovascular: normal heart rate noted  Respiratory: normal respiratory effort, no distress  Abdomen: gravid, soft, non-tender  Pelvic: Cervical exam deferred         Fetal Status:     Movement: Present    Fetal Surveillance Testing today: PN2   Chaperone: N/A    No results found for this or any previous visit (from the past 24 hour(s)).  Assessment & Plan:  High-risk pregnancy: [id number] at [redacted]w[redacted]d with an Estimated Date of Delivery: 12/20/22      ICD-10-CM   1. Supervision of high risk pregnancy in second trimester  O09.92     2. History of IUFD  Z87.59     3. H/O preeclampsia  O09.299     4. Congenital megaureter + bilateral RPD  Q62.2         Meds: No orders of the defined types were placed in this encounter.   Orders: No orders of the defined types were placed in this encounter.    Labs/procedures today: none  Treatment Plan:  repeat sonogram 32  weeks    Follow-up: Return in about 3 weeks (around 10/15/2022) for LROB, HROB.   No future appointments.  No orders of the defined types were placed in this encounter.  Lazaro Arms  Attending Physician for the Center for [facility] 09/24/2022 11:38 AM

## 2022-10-02 LAB — RPR: RPR Ser Ql: NONREACTIVE

## 2022-10-02 LAB — CBC
Hematocrit: 31.6 % — ABNORMAL LOW (ref 34.0–46.6)
Hemoglobin: 10.8 g/dL — ABNORMAL LOW (ref 11.1–15.9)
MCH: 28.7 pg (ref 26.6–33.0)
MCHC: 34.2 g/dL (ref 31.5–35.7)
MCV: 84 fL (ref 79–97)
Platelets: 261 10*3/uL (ref 150–450)
RBC: 3.76 x10E6/uL — ABNORMAL LOW (ref 3.77–5.28)
RDW: 12.3 % (ref 11.7–15.4)
WBC: 7.7 10*3/uL (ref 3.4–10.8)

## 2022-10-02 LAB — ANTIBODY SCREEN: Antibody Screen: NEGATIVE

## 2022-10-02 LAB — GLUCOSE TOLERANCE, 2 HOURS W/ 1HR
Glucose, 2 hour: 105 mg/dL (ref 70–152)
Glucose, Fasting: 79 mg/dL (ref 70–91)

## 2022-10-02 LAB — HIV ANTIBODY (ROUTINE TESTING W REFLEX): HIV Screen 4th Generation wRfx: NONREACTIVE

## 2022-10-11 ENCOUNTER — Other Ambulatory Visit: Payer: Self-pay

## 2022-10-11 ENCOUNTER — Inpatient Hospital Stay (HOSPITAL_COMMUNITY)
Admission: AD | Admit: 2022-10-11 | Discharge: 2022-10-11 | Disposition: A | Discharge status: Home / Self Care | Payer: No Typology Code available for payment source | Attending: Obstetrics & Gynecology | Admitting: Obstetrics & Gynecology

## 2022-10-11 ENCOUNTER — Encounter (HOSPITAL_COMMUNITY): Payer: Self-pay | Admitting: Obstetrics & Gynecology

## 2022-10-11 ENCOUNTER — Encounter: Payer: Self-pay | Admitting: Women's Health

## 2022-10-11 ENCOUNTER — Other Ambulatory Visit: Payer: Self-pay | Admitting: Obstetrics & Gynecology

## 2022-10-11 DIAGNOSIS — R519 Headache, unspecified: Secondary | ICD-10-CM | POA: Insufficient documentation

## 2022-10-11 DIAGNOSIS — O26893 Other specified pregnancy related conditions, third trimester: Secondary | ICD-10-CM | POA: Diagnosis not present

## 2022-10-11 DIAGNOSIS — O09293 Supervision of pregnancy with other poor reproductive or obstetric history, third trimester: Secondary | ICD-10-CM | POA: Diagnosis not present

## 2022-10-11 DIAGNOSIS — R1011 Right upper quadrant pain: Secondary | ICD-10-CM | POA: Insufficient documentation

## 2022-10-11 DIAGNOSIS — R0600 Dyspnea, unspecified: Secondary | ICD-10-CM | POA: Diagnosis not present

## 2022-10-11 DIAGNOSIS — O359XX0 Maternal care for (suspected) fetal abnormality and damage, unspecified, not applicable or unspecified: Secondary | ICD-10-CM

## 2022-10-11 DIAGNOSIS — O99353 Diseases of the nervous system complicating pregnancy, third trimester: Secondary | ICD-10-CM | POA: Insufficient documentation

## 2022-10-11 DIAGNOSIS — G44209 Tension-type headache, unspecified, not intractable: Secondary | ICD-10-CM | POA: Diagnosis not present

## 2022-10-11 DIAGNOSIS — Z3A30 30 weeks gestation of pregnancy: Secondary | ICD-10-CM | POA: Insufficient documentation

## 2022-10-11 DIAGNOSIS — H539 Unspecified visual disturbance: Secondary | ICD-10-CM | POA: Insufficient documentation

## 2022-10-11 DIAGNOSIS — O99013 Anemia complicating pregnancy, third trimester: Secondary | ICD-10-CM | POA: Insufficient documentation

## 2022-10-11 LAB — CBC WITH DIFFERENTIAL/PLATELET
Abs Immature Granulocytes: 0.04 10*3/uL (ref 0.00–0.07)
Basophils Absolute: 0 10*3/uL (ref 0.0–0.1)
Basophils Relative: 0 %
Eosinophils Absolute: 0.1 10*3/uL (ref 0.0–0.5)
Eosinophils Relative: 1 %
HCT: 29.1 % — ABNORMAL LOW (ref 36.0–46.0)
Hemoglobin: 9.8 g/dL — ABNORMAL LOW (ref 12.0–15.0)
Immature Granulocytes: 1 %
Lymphocytes Relative: 23 %
Lymphs Abs: 1.8 10*3/uL (ref 0.7–4.0)
MCH: 28.8 pg (ref 26.0–34.0)
MCHC: 33.7 g/dL (ref 30.0–36.0)
MCV: 85.6 fL (ref 80.0–100.0)
Monocytes Absolute: 0.9 10*3/uL (ref 0.1–1.0)
Monocytes Relative: 11 %
Neutro Abs: 5.1 10*3/uL (ref 1.7–7.7)
Neutrophils Relative %: 64 %
Platelets: 240 10*3/uL (ref 150–400)
RBC: 3.4 MIL/uL — ABNORMAL LOW (ref 3.87–5.11)
RDW: 12.6 % (ref 11.5–15.5)
WBC: 7.9 10*3/uL (ref 4.0–10.5)
nRBC: 0 % (ref 0.0–0.2)

## 2022-10-11 LAB — COMPREHENSIVE METABOLIC PANEL
ALT: 12 U/L (ref 0–44)
AST: 13 U/L — ABNORMAL LOW (ref 15–41)
Albumin: 3 g/dL — ABNORMAL LOW (ref 3.5–5.0)
Alkaline Phosphatase: 85 U/L (ref 38–126)
Anion gap: 8 (ref 5–15)
BUN: 6 mg/dL (ref 6–20)
CO2: 22 mmol/L (ref 22–32)
Calcium: 9.1 mg/dL (ref 8.9–10.3)
Chloride: 103 mmol/L (ref 98–111)
Creatinine, Ser: 0.7 mg/dL (ref 0.44–1.00)
GFR, Estimated: >60 mL/min (ref >60)
Glucose, Bld: 66 mg/dL — ABNORMAL LOW (ref 70–99)
Potassium: 3.5 mmol/L (ref 3.5–5.1)
Sodium: 133 mmol/L — ABNORMAL LOW (ref 135–145)
Total Bilirubin: 0.4 mg/dL (ref 0.3–1.2)
Total Protein: 6.1 g/dL — ABNORMAL LOW (ref 6.5–8.1)

## 2022-10-11 LAB — PROTEIN / CREATININE RATIO, URINE
Creatinine, Urine: 162 mg/dL
Protein Creatinine Ratio: 0.1 mg/mg{Cre} (ref 0.00–0.15)
Total Protein, Urine: 17 mg/dL

## 2022-10-11 MED ORDER — ACETAMINOPHEN 500 MG PO TABS: 1000.0000 mg | ORAL_TABLET | Freq: Once | ORAL | Status: DC

## 2022-10-11 MED ORDER — ACETAMINOPHEN-CAFFEINE 500-65 MG PO TABS
2.0000 | ORAL_TABLET | Freq: Once | ORAL | Status: AC
  Administered 2022-10-11: 2 via ORAL
  Filled 2022-10-11: qty 2

## 2022-10-11 MED ORDER — FERROUS SULFATE 325 (65 FE) MG PO TBEC: 325.0000 mg | DELAYED_RELEASE_TABLET | Freq: Two times a day (BID) | ORAL | 3 refills | Status: DC

## 2022-10-11 NOTE — MAU Provider Note (Cosign Needed Addendum)
History     CSN: [id number]  Arrival date and time: 10/11/22 1502   Event Date/Time   First Provider Initiated Contact with Patient 10/11/22 1602     HPI Jacqueline Barrera is a 27 y.o. [redacted]w[redacted]d [id number] presenting for evaluation of HA, visual disturbance, RUQ pain, and dyspnea.  She called her OB/GYN's office and spoke with nurse line, who recommended she be evaluated today.  She has history of preeclampsia that started with her first pregnancy, and unfortunately resulted in fetal demise.  Her second pregnancy was not complicated by preeclampsia, but was preterm spontaneous onset of labor at 35 weeks.  She reports history of migraines, however these have been worse during pregnancy.  Headache is bilateral, non-pulsatile, no affect on activity level, no photophobia nor phonophobia. She has not taken any medication for this.  Compliant with her baby aspirin. Headache started this morning, visual disturbance started this morning.  Did have episode of vomiting earlier today. Has exertional dyspnea starting 2 weeks ago, but now having dyspnea at rest.  Reports bilateral ankle edema that is now resolved.  Denies vaginal bleeding, LOF, reports positive fetal movements.  No contractions.  OB History     Gravida  5   Para  2   Term      Preterm  2   AB  2   Living  1      SAB  0   IAB  2   Ectopic      Multiple  0   Live Births  1           Past Medical History:  Diagnosis Date   Anxiety    Scoliosis    Trichimoniasis 05/04/2021   05/04/21 rx flagyl    Past Surgical History:  Procedure Laterality Date   ADENOIDECTOMY     TONSILLECTOMY AND ADENOIDECTOMY     WISDOM TOOTH EXTRACTION      Family History  Problem Relation Age of Onset   Healthy Mother    Hyperlipidemia Maternal Aunt    Hypertension Maternal Grandmother     Social History   Tobacco Use   Smoking status: Former    Types: Cigars   Smokeless tobacco: Never   Tobacco comments:    smokes 1 cigar  daily  Vaping Use   Vaping Use: Never used  Substance Use Topics   Alcohol use: Not Currently    Alcohol/week: 35.0 standard drinks of alcohol    Types: 35 Shots of liquor per week    Comment: "on Saturday's"   Drug use: Not Currently    Frequency: 7.0 times per week    Types: Marijuana    Comment: Stopped May 2024    Allergies:  Allergies  Allergen Reactions   Other Nausea And Vomiting    jalapenos    Medications Prior to Admission  Medication Sig Dispense Refill Last Dose   aspirin EC 81 MG tablet Take 2 tablets (162 mg total) by mouth daily. Swallow whole. 180 tablet 2 10/11/2022 at 1000   Prenatal Vit-Fe Fumarate-FA (PRENATAL VITAMIN PO) Take by mouth.   10/11/2022 at 1000   Blood Pressure Monitor MISC For regular home bp monitoring during pregnancy 1 each 0    Doxylamine-Pyridoxine (DICLEGIS) 10-10 MG TBEC 2 tabs q hs, if sx persist add 1 tab q am on day 3, if sx persist add 1 tab q afternoon on day 4 (Patient not taking: Reported on 07/08/2022) 100 tablet 6    progesterone (PROMETRIUM) 200 MG capsule  Place 1 capsule (200 mg total) vaginally at bedtime. (Patient not taking: Reported on 07/08/2022) 30 capsule 5    promethazine (PHENERGAN) 12.5 MG tablet Take 1 tablet (12.5 mg total) by mouth every 6 (six) hours as needed for nausea or vomiting. (Patient not taking: Reported on 07/08/2022) 30 tablet 1     ROS: Per HPI Physical Exam   Blood pressure 121/70, pulse (!) 101, temperature 98.2 F (36.8 C), temperature source Oral, resp. rate 18, last menstrual period 03/23/2022, SpO2 99 %.  Physical Exam Constitutional:      General: She is not in acute distress. HENT:     Head: Normocephalic and atraumatic.  Eyes:     Extraocular Movements: Extraocular movements intact.     Conjunctiva/sclera: Conjunctivae normal.  Cardiovascular:     Rate and Rhythm: Regular rhythm. Tachycardia present.     Heart sounds: Normal heart sounds.  Pulmonary:     Effort: Pulmonary effort is  normal.     Breath sounds: Normal breath sounds.  Skin:    General: Skin is warm and dry.  Neurological:     General: No focal deficit present.     Mental Status: She is alert and oriented to person, place, and time.    MAU Course   MDM  1630 Normotensive, tachycardic with dyspnea.  Obtain EKG for tachycardia with dyspnea.  Primarily suspect tension headache.  Rule out preeclampsia. CBC, CMP, urine protein.  1700 EKG sinus tach. Excedrin Tension given. FT: bpm 150s, mod variability, accelerations presents, variable decelerations that come back to baseline, appropriate for GA.  1750 Hgb 9.8.  Urine protein/creatinine ratio unremarkable, CMP unremarkable.  Patient has maintained SpO2 of 99% on room air during observation.  Normal work of breathing with resolution of headache. BP remained normotensive.  Assessment and Plan   Tension Headache Symptoms consistent with tension headache. Improved with Excedrin. Recommend OTC tylenol or Excedrin TENSION for future headaches.  Anemia Hgb 9.8. Start ferrous sulfate 325 mg daily.  Tiffany Kocher DO 10/11/2022, 6:17 PM    I confirm that I have verified and agree with the information documented in the resident's note.   Carlynn Herald, CNM 10/13/2022 8:51 PM

## 2022-10-11 NOTE — MAU Note (Signed)
Jacqueline Barrera is a 27 y.o. at [redacted]w[redacted]d here in MAU reporting: she was instructed to be seen in MAU for BP evaluation secondary has HA, visual disturbances, and epigastric pain.  Reports has current HA, didn't take any meds other prescribed Baby Aspirin.  States has Hx of Pre Eclampsia with first pregnancy. Denies VB or LOF.  Endorses +FM. LMP: NA Onset of complaint: 1.5 weeks Pain score: 4 Vitals:   10/11/22 1547  BP: 124/77  Pulse: (!) 117  Resp: 18  Temp: 98.2 F (36.8 C)  SpO2: 99%     FHT:155 bpm Lab orders placed from triage:   UA

## 2022-10-16 ENCOUNTER — Ambulatory Visit: Payer: No Typology Code available for payment source | Admitting: Obstetrics & Gynecology

## 2022-10-16 ENCOUNTER — Encounter: Payer: Self-pay | Admitting: Obstetrics & Gynecology

## 2022-10-16 ENCOUNTER — Ambulatory Visit: Payer: No Typology Code available for payment source

## 2022-10-16 VITALS — BP 139/78 | HR 86 | Wt 193.6 lb

## 2022-10-16 DIAGNOSIS — Z3A30 30 weeks gestation of pregnancy: Secondary | ICD-10-CM

## 2022-10-16 DIAGNOSIS — O0992 Supervision of high risk pregnancy, unspecified, second trimester: Secondary | ICD-10-CM

## 2022-10-16 DIAGNOSIS — O0993 Supervision of high risk pregnancy, unspecified, third trimester: Secondary | ICD-10-CM

## 2022-10-16 DIAGNOSIS — O359XX0 Maternal care for (suspected) fetal abnormality and damage, unspecified, not applicable or unspecified: Secondary | ICD-10-CM

## 2022-10-16 NOTE — Progress Notes (Signed)
[place] 30+5 wks,cephalic,fundal left lateral placenta gr 3,AFI 17 cm,normal ovaries,FHR 146 bpm,mild left hydronephrosis with dilated ureter,left renal pelvis 7 mm,right renal pelvis 5 mm with dilated ureter,EFW 1611 g 35%

## 2022-10-16 NOTE — Progress Notes (Signed)
HIGH-RISK PREGNANCY VISIT Patient name: Jacqueline Barrera MRN [id number]  Date of birth: 1995-06-21 Chief Complaint:   Routine Prenatal Visit  History of Present Illness:   Jacqueline Barrera is a 27 y.o. [id number] female at [redacted]w[redacted]d with an Estimated Date of Delivery: 12/20/22 being seen today for ongoing management of a high-risk pregnancy complicated by:  -Bilateral RPD with possible megaureter -h/o severe pre/abruption/IUFD  Today she reports no complaints.   Contractions: Not present. Vag. Bleeding: None.  Movement: Present. denies leaking of fluid.      06/10/2022    3:07 PM 06/01/2018   10:31 AM 06/10/2017    9:05 AM 08/28/2016    2:02 PM  Depression screen PHQ 2/9  Decreased Interest 2 0 2 1  Down, Depressed, Hopeless 2 0 0 0  PHQ - 2 Score 4 0 2 1  Altered sleeping 3  3 3   Tired, decreased energy 3  3 2   Change in appetite 3  0 2  Feeling bad or failure about yourself  0  0 0  Trouble concentrating 0  0 0  Moving slowly or fidgety/restless 0  0 0  Suicidal thoughts 0  0 0  PHQ-9 Score 13  8 8      Current Outpatient Medications  Medication Instructions   aspirin EC 162 mg, Oral, Daily, Swallow whole.   Blood Pressure Monitor MISC For regular home bp monitoring during pregnancy   ferrous sulfate 325 mg, Oral, 2 times daily   Prenatal Vit-Fe Fumarate-FA (PRENATAL VITAMIN PO) Oral     Review of Systems:   Pertinent items are noted in HPI Denies abnormal vaginal discharge w/ itching/odor/irritation, headaches, visual changes, shortness of breath, chest pain, abdominal pain, severe nausea/vomiting, or problems with urination or bowel movements unless otherwise stated above. Pertinent History Reviewed:  Reviewed past medical,surgical, social, obstetrical and family history.  Reviewed problem list, medications and allergies. Physical Assessment:   Vitals:   10/16/22 1129  BP: 139/78  Pulse: 86  Weight: 193 lb 9.6 oz (87.8 kg)  Body mass index is 33.23 kg/m.            Physical Examination:   General appearance: alert, well appearing, and in no distress  Mental status: normal mood, behavior, speech, dress, motor activity, and thought processes  Skin: warm & dry   Extremities:      Cardiovascular: normal heart rate noted  Respiratory: normal respiratory effort, no distress  Abdomen: gravid, soft, non-tender  Pelvic: Cervical exam deferred         Fetal Status: Fetal Heart Rate (bpm): 145 Fundal Height: 31 cm Movement: Present    Fetal Surveillance Testing today: growth scan scheduled for later this afternoon   Chaperone: N/A    No results found for this or any previous visit (from the past 24 hour(s)).   Assessment & Plan:  High-risk pregnancy: [id number] at [redacted]w[redacted]d with an Estimated Date of Delivery: 12/20/22   -Bilateral RPD with possible megaureter -h/o severe pre/abruption/IUFD  Meds: No orders of the defined types were placed in this encounter.   Labs/procedures today: none  Treatment Plan:  scheduled for follow up US today, next visit in 2 wks  Reviewed: Preterm labor symptoms and general obstetric precautions including but not limited to vaginal bleeding, contractions, leaking of fluid and fetal movement were reviewed in detail with the patient.  All questions were answered.    Follow-up: Return in about 2 weeks (around 10/30/2022) for LROB visit.   Future Appointments  Date Time  Provider Department Center  10/16/2022  3:45 PM [facility] [place] [facility] None  10/29/2022  4:10 PM Cheral Marker, CNM [facility]    No orders of the defined types were placed in this encounter.   Myna Hidalgo, DO Attending Obstetrician & Gynecologist, [facility] for [employer], [facility]

## 2022-10-28 ENCOUNTER — Encounter: Payer: No Typology Code available for payment source | Admitting: Advanced Practice Midwife

## 2022-10-29 ENCOUNTER — Ambulatory Visit: Payer: No Typology Code available for payment source | Admitting: Obstetrics & Gynecology

## 2022-10-29 ENCOUNTER — Encounter: Payer: No Typology Code available for payment source | Admitting: Women's Health

## 2022-10-29 ENCOUNTER — Encounter: Payer: Self-pay | Admitting: Obstetrics & Gynecology

## 2022-10-29 VITALS — BP 135/80 | HR 115 | Wt 197.0 lb

## 2022-10-29 DIAGNOSIS — Z3A32 32 weeks gestation of pregnancy: Secondary | ICD-10-CM

## 2022-10-29 DIAGNOSIS — O0992 Supervision of high risk pregnancy, unspecified, second trimester: Secondary | ICD-10-CM

## 2022-10-29 DIAGNOSIS — Z8759 Personal history of other complications of pregnancy, childbirth and the puerperium: Secondary | ICD-10-CM

## 2022-10-29 DIAGNOSIS — O359XX0 Maternal care for (suspected) fetal abnormality and damage, unspecified, not applicable or unspecified: Secondary | ICD-10-CM

## 2022-10-29 DIAGNOSIS — O0993 Supervision of high risk pregnancy, unspecified, third trimester: Secondary | ICD-10-CM

## 2022-10-29 NOTE — Progress Notes (Signed)
HIGH-RISK PREGNANCY VISIT Patient name: Jacqueline Barrera MRN [id number]  Date of birth: 1996/04/04 Chief Complaint:   Routine Prenatal Visit  History of Present Illness:   Jacqueline Barrera is a 27 y.o. [id number] female at [redacted]w[redacted]d with an Estimated Date of Delivery: 12/20/22 being seen today for ongoing management of a high-risk pregnancy complicated by hx of severe pre eclampsia with IUFD, bilateral hydroureter with mild RPD, normal fluid(female).    Today she reports no complaints. Contractions: Not present. Vag. Bleeding: None.  Movement: Present. denies leaking of fluid.      06/10/2022    3:07 PM 06/01/2018   10:31 AM 06/10/2017    9:05 AM 08/28/2016    2:02 PM  Depression screen PHQ 2/9  Decreased Interest 2 0 2 1  Down, Depressed, Hopeless 2 0 0 0  PHQ - 2 Score 4 0 2 1  Altered sleeping 3  3 3   Tired, decreased energy 3  3 2   Change in appetite 3  0 2  Feeling bad or failure about yourself  0  0 0  Trouble concentrating 0  0 0  Moving slowly or fidgety/restless 0  0 0  Suicidal thoughts 0  0 0  PHQ-9 Score 13  8 8         06/10/2022    3:08 PM  GAD 7 : Generalized Anxiety Score  Nervous, Anxious, on Edge 3  Control/stop worrying 3  Worry too much - different things 3  Trouble relaxing 1  Restless 1  Easily annoyed or irritable 3  Afraid - awful might happen 0  Total GAD 7 Score 14     Review of Systems:   Pertinent items are noted in HPI Denies abnormal vaginal discharge w/ itching/odor/irritation, headaches, visual changes, shortness of breath, chest pain, abdominal pain, severe nausea/vomiting, or problems with urination or bowel movements unless otherwise stated above. Pertinent History Reviewed:  Reviewed past medical,surgical, social, obstetrical and family history.  Reviewed problem list, medications and allergies. Physical Assessment:   Vitals:   10/29/22 1402  BP: 135/80  Pulse: (!) 115  Weight: 197 lb (89.4 kg)  Body mass index is 33.81 kg/m.            Physical Examination:   General appearance: alert, well appearing, and in no distress  Mental status: alert, oriented to person, place, and time  Skin: warm & dry   Extremities:      Cardiovascular: normal heart rate noted  Respiratory: normal respiratory effort, no distress  Abdomen: gravid, soft, non-tender  Pelvic: Cervical exam deferred         Fetal Status: Fetal Heart Rate (bpm): 165 Fundal Height: 31 cm Movement: Present    Fetal Surveillance Testing today: FHR 165   Chaperone:     No results found for this or any previous visit (from the past 24 hour(s)).  Assessment & Plan:  High-risk pregnancy: [id number] at [redacted]w[redacted]d with an Estimated Date of Delivery: 12/20/22      ICD-10-CM   1. Supervision of high risk pregnancy in second trimester  O09.92     2. History of IUFD  Z87.59     3. Bilateral RPD-->inform peds at delivery  O35.9XX0         Meds: No orders of the defined types were placed in this encounter.   Orders: No orders of the defined types were placed in this encounter.    Labs/procedures today: none  Treatment Plan:    Reviewed:   Follow-up:  Return in about 2 weeks (around 11/12/2022) for HROB.   No future appointments.  No orders of the defined types were placed in this encounter.  Lazaro Arms  Attending Physician for the Center for [facility] 10/29/2022 2:37 PM

## 2022-11-11 ENCOUNTER — Encounter (HOSPITAL_COMMUNITY): Payer: Self-pay | Admitting: Family Medicine

## 2022-11-11 ENCOUNTER — Inpatient Hospital Stay (HOSPITAL_COMMUNITY)
Admission: AD | Admit: 2022-11-11 | Discharge: 2022-11-11 | Disposition: A | Discharge status: Home / Self Care | Payer: No Typology Code available for payment source | Attending: Family Medicine | Admitting: Family Medicine

## 2022-11-11 DIAGNOSIS — O09213 Supervision of pregnancy with history of pre-term labor, third trimester: Secondary | ICD-10-CM | POA: Insufficient documentation

## 2022-11-11 DIAGNOSIS — Z3A34 34 weeks gestation of pregnancy: Secondary | ICD-10-CM | POA: Diagnosis not present

## 2022-11-11 DIAGNOSIS — O99013 Anemia complicating pregnancy, third trimester: Secondary | ICD-10-CM | POA: Diagnosis not present

## 2022-11-11 DIAGNOSIS — O26893 Other specified pregnancy related conditions, third trimester: Secondary | ICD-10-CM | POA: Diagnosis not present

## 2022-11-11 DIAGNOSIS — R0602 Shortness of breath: Secondary | ICD-10-CM | POA: Diagnosis not present

## 2022-11-11 LAB — COMPREHENSIVE METABOLIC PANEL
ALT: 12 U/L (ref 0–44)
AST: 17 U/L (ref 15–41)
Albumin: 3.1 g/dL — ABNORMAL LOW (ref 3.5–5.0)
Alkaline Phosphatase: 118 U/L (ref 38–126)
Anion gap: 10 (ref 5–15)
BUN: 6 mg/dL (ref 6–20)
CO2: 21 mmol/L — ABNORMAL LOW (ref 22–32)
Calcium: 9.2 mg/dL (ref 8.9–10.3)
Chloride: 103 mmol/L (ref 98–111)
Creatinine, Ser: 0.69 mg/dL (ref 0.44–1.00)
GFR, Estimated: >60 mL/min (ref >60)
Glucose, Bld: 72 mg/dL (ref 70–99)
Potassium: 3.5 mmol/L (ref 3.5–5.1)
Sodium: 134 mmol/L — ABNORMAL LOW (ref 135–145)
Total Bilirubin: 0.4 mg/dL (ref 0.3–1.2)
Total Protein: 6.6 g/dL (ref 6.5–8.1)

## 2022-11-11 LAB — URINALYSIS, ROUTINE W REFLEX MICROSCOPIC
Bilirubin Urine: NEGATIVE
Glucose, UA: NEGATIVE mg/dL
Hgb urine dipstick: NEGATIVE
Ketones, ur: NEGATIVE mg/dL
Leukocytes,Ua: NEGATIVE
Nitrite: NEGATIVE
Protein, ur: 30 mg/dL — AB
Specific Gravity, Urine: 1.019 (ref 1.005–1.030)
pH: 7 (ref 5.0–8.0)

## 2022-11-11 LAB — CBC
HCT: 32.2 % — ABNORMAL LOW (ref 36.0–46.0)
Hemoglobin: 10.4 g/dL — ABNORMAL LOW (ref 12.0–15.0)
MCH: 28.1 pg (ref 26.0–34.0)
MCHC: 32.3 g/dL (ref 30.0–36.0)
MCV: 87 fL (ref 80.0–100.0)
Platelets: 272 10*3/uL (ref 150–400)
RBC: 3.7 MIL/uL — ABNORMAL LOW (ref 3.87–5.11)
RDW: 13.3 % (ref 11.5–15.5)
WBC: 7.5 10*3/uL (ref 4.0–10.5)
nRBC: 0 % (ref 0.0–0.2)

## 2022-11-11 NOTE — MAU Provider Note (Signed)
History     CSN: [id number]  Arrival date and time: 11/11/22 1435   None     Chief Complaint  Patient presents with   Abdominal Pain   Contractions   Decreased Fetal Movement   HPI This is a 27 year old G5 P0-2-2-1 at 34 weeks and 3 days with a pregnancy complicated by severe preeclampsia in a previous pregnancy that resulted in an abruption and IUFD.  She also has a history of a spontaneous 36-week preterm birth.  She presents with contractions and feeling like she cannot catch her breath.  Her contractions are intermittent about every 30 minutes or so.  Her feeling like she cannot catch her breath started around 1230.  She was at work when it started and just talking to coworkers.  She was inside in an air conditioned place and seated.  She denies chest pain, cough, wheezing.  She does have some right upper quadrant abdominal pain that has been intermittent, but this started over a month ago and is not associated with her shortness of breath. The shortness of breath stopped a few moments ago.  OB History     Gravida  5   Para  2   Term      Preterm  2   AB  2   Living  1      SAB  0   IAB  2   Ectopic      Multiple  0   Live Births  1           Past Medical History:  Diagnosis Date   Anxiety    Scoliosis    Trichimoniasis 05/04/2021   05/04/21 rx flagyl    Past Surgical History:  Procedure Laterality Date   ADENOIDECTOMY     TONSILLECTOMY AND ADENOIDECTOMY     WISDOM TOOTH EXTRACTION      Family History  Problem Relation Age of Onset   Healthy Mother    Hyperlipidemia Maternal Aunt    Hypertension Maternal Grandmother     Social History   Tobacco Use   Smoking status: Former    Types: Cigars   Smokeless tobacco: Never   Tobacco comments:    smokes 1 cigar daily  Vaping Use   Vaping Use: Never used  Substance Use Topics   Alcohol use: Not Currently    Alcohol/week: 35.0 standard drinks of alcohol    Types: 35 Shots of liquor per  week    Comment: "on Saturday's"   Drug use: Not Currently    Frequency: 7.0 times per week    Types: Marijuana    Comment: Stopped May 2024    Allergies:  Allergies  Allergen Reactions   Other Nausea And Vomiting    jalapenos    Medications Prior to Admission  Medication Sig Dispense Refill Last Dose   aspirin EC 81 MG tablet Take 2 tablets (162 mg total) by mouth daily. Swallow whole. 180 tablet 2    Blood Pressure Monitor MISC For regular home bp monitoring during pregnancy 1 each 0    ferrous sulfate 325 (65 FE) MG EC tablet Take 1 tablet (325 mg total) by mouth 2 (two) times daily. 60 tablet 3    Prenatal Vit-Fe Fumarate-FA (PRENATAL VITAMIN PO) Take by mouth.       Review of Systems Physical Exam   Blood pressure 120/73, pulse (!) 112, temperature 98.1 F (36.7 C), temperature source Oral, resp. rate (!) 22, last menstrual period 03/23/2022, SpO2 98 %.  Physical Exam Vitals and nursing note reviewed.  Constitutional:      Appearance: She is well-developed.  HENT:     Head: Normocephalic and atraumatic.  Cardiovascular:     Rate and Rhythm: Regular rhythm. Tachycardia present.  Pulmonary:     Effort: Pulmonary effort is normal.     Breath sounds: Normal breath sounds.  Abdominal:     General: Abdomen is flat. Bowel sounds are normal.     Tenderness: There is abdominal tenderness. There is no guarding or rebound. Negative signs include Murphy's sign and Rovsing's sign.  Skin:    General: Skin is warm and dry.  Neurological:     Mental Status: She is alert.     MAU Course  Procedures  MDM Check CBC, CMP.  As lungs are clear, I do not feel the need to get a chest x-ray.  Additionally, as this is not ongoing, I do not feel like she needs a CTA to rule out a PE.   Results for orders placed or performed during the hospital encounter of 11/11/22 (from the past 24 hour(s))  Urinalysis, Routine w reflex microscopic -Urine, Clean Catch     Status: Abnormal    Collection Time: 11/11/22  2:59 PM  Result Value Ref Range   Color, Urine YELLOW YELLOW   APPearance CLEAR CLEAR   Specific Gravity, Urine 1.019 1.005 - 1.030   pH 7.0 5.0 - 8.0   Glucose, UA NEGATIVE NEGATIVE mg/dL   Hgb urine dipstick NEGATIVE NEGATIVE   Bilirubin Urine NEGATIVE NEGATIVE   Ketones, ur NEGATIVE NEGATIVE mg/dL   Protein, ur 30 (A) NEGATIVE mg/dL   Nitrite NEGATIVE NEGATIVE   Leukocytes,Ua NEGATIVE NEGATIVE   RBC / HPF 0-5 0 - 5 RBC/hpf   WBC, UA 0-5 0 - 5 WBC/hpf   Bacteria, UA RARE (A) NONE SEEN   Squamous Epithelial / HPF 0-5 0 - 5 /HPF   Mucus PRESENT   CBC     Status: Abnormal   Collection Time: 11/11/22  4:46 PM  Result Value Ref Range   WBC 7.5 4.0 - 10.5 K/uL   RBC 3.70 (L) 3.87 - 5.11 MIL/uL   Hemoglobin 10.4 (L) 12.0 - 15.0 g/dL   HCT 16.1 (L) 09.6 - 04.5 %   MCV 87.0 80.0 - 100.0 fL   MCH 28.1 26.0 - 34.0 pg   MCHC 32.3 30.0 - 36.0 g/dL   RDW 40.9 81.1 - 91.4 %   Platelets 272 150 - 400 K/uL   nRBC 0.0 0.0 - 0.2 %  Comprehensive metabolic panel     Status: Abnormal   Collection Time: 11/11/22  4:46 PM  Result Value Ref Range   Sodium 134 (L) 135 - 145 mmol/L   Potassium 3.5 3.5 - 5.1 mmol/L   Chloride 103 98 - 111 mmol/L   CO2 21 (L) 22 - 32 mmol/L   Glucose, Bld 72 70 - 99 mg/dL   BUN 6 6 - 20 mg/dL   Creatinine, Ser 7.82 0.44 - 1.00 mg/dL   Calcium 9.2 8.9 - 95.6 mg/dL   Total Protein 6.6 6.5 - 8.1 g/dL   Albumin 3.1 (L) 3.5 - 5.0 g/dL   AST 17 15 - 41 U/L   ALT 12 0 - 44 U/L   Alkaline Phosphatase 118 38 - 126 U/L   Total Bilirubin 0.4 0.3 - 1.2 mg/dL   GFR, Estimated >21 >30 mL/min   Anion gap 10 5 - 15    Assessment and  Plan   1. [redacted] weeks gestation of pregnancy   2. Anemia affecting pregnancy in third trimester   3. SOB (shortness of breath)    SOB resolved. Continue iron supplements for anemia. F/u in office tomorrow.  NST reactive.  Levie Heritage 11/11/2022, 4:15 PM

## 2022-11-11 NOTE — MAU Note (Addendum)
.  Jacqueline Barrera is a 27 y.o. at [redacted]w[redacted]d here in MAU reporting: around 1200 she began having shortness of breath and RUQ pain. Denies cough or other URI sx. She has also seen floaters earlier today as well. Denies VB or LOF. +FM. Patient also reports contractions every 10-15 minutes. DFM today.   Pain score: ctx- 5        RUQ-6 Vitals:   11/11/22 1501  BP: 121/73  Pulse: (!) 107  Resp: (!) 22  Temp: 98.1 F (36.7 C)     FHT:147 Lab orders placed from triage:  UA

## 2022-11-12 ENCOUNTER — Encounter: Payer: Self-pay | Admitting: Obstetrics & Gynecology

## 2022-11-12 ENCOUNTER — Ambulatory Visit (INDEPENDENT_AMBULATORY_CARE_PROVIDER_SITE_OTHER): Payer: No Typology Code available for payment source | Admitting: Obstetrics & Gynecology

## 2022-11-12 VITALS — BP 129/78 | HR 97 | Wt 196.0 lb

## 2022-11-12 DIAGNOSIS — Z8759 Personal history of other complications of pregnancy, childbirth and the puerperium: Secondary | ICD-10-CM

## 2022-11-12 DIAGNOSIS — Z3A34 34 weeks gestation of pregnancy: Secondary | ICD-10-CM

## 2022-11-12 DIAGNOSIS — O359XX0 Maternal care for (suspected) fetal abnormality and damage, unspecified, not applicable or unspecified: Secondary | ICD-10-CM

## 2022-11-12 DIAGNOSIS — O0993 Supervision of high risk pregnancy, unspecified, third trimester: Secondary | ICD-10-CM

## 2022-11-12 NOTE — Progress Notes (Signed)
LOW-RISK PREGNANCY VISIT Patient name: Jacqueline Barrera MRN [id number]  Date of birth: Dec 03, 1995 Chief Complaint:   Routine Prenatal Visit  History of Present Illness:   Jacqueline Barrera is a 27 y.o. [id number] female at [redacted]w[redacted]d with an Estimated Date of Delivery: 12/20/22 being seen today for ongoing management of a low-risk pregnancy.     06/10/2022    3:07 PM 06/01/2018   10:31 AM 06/10/2017    9:05 AM 08/28/2016    2:02 PM  Depression screen PHQ 2/9  Decreased Interest 2 0 2 1  Down, Depressed, Hopeless 2 0 0 0  PHQ - 2 Score 4 0 2 1  Altered sleeping 3  3 3   Tired, decreased energy 3  3 2   Change in appetite 3  0 2  Feeling bad or failure about yourself  0  0 0  Trouble concentrating 0  0 0  Moving slowly or fidgety/restless 0  0 0  Suicidal thoughts 0  0 0  PHQ-9 Score 13  8 8     Today she reports no complaints. Contractions: Irritability. Vag. Bleeding: None.  Movement: Present. denies leaking of fluid. Review of Systems:   Pertinent items are noted in HPI Denies abnormal vaginal discharge w/ itching/odor/irritation, headaches, visual changes, shortness of breath, chest pain, abdominal pain, severe nausea/vomiting, or problems with urination or bowel movements unless otherwise stated above. Pertinent History Reviewed:  Reviewed past medical,surgical, social, obstetrical and family history.  Reviewed problem list, medications and allergies. Physical Assessment:   Vitals:   11/12/22 1518  BP: 129/78  Pulse: 97  Weight: 196 lb (88.9 kg)  Body mass index is 33.64 kg/m.        Physical Examination:   General appearance: Well appearing, and in no distress  Mental status: Alert, oriented to person, place, and time  Skin: Warm & dry  Cardiovascular: Normal heart rate noted  Respiratory: Normal respiratory effort, no distress  Abdomen: Soft, gravid, nontender  Pelvic: Cervical exam deferred         Extremities: Edema: None  Fetal Status: Fetal Heart Rate (bpm): 145 Fundal  Height: 32 cm Movement: Present Presentation: Vertex  Chaperone: n/a    Results for orders placed or performed during the hospital encounter of 11/11/22 (from the past 24 hour(s))  CBC   Collection Time: 11/11/22  4:46 PM  Result Value Ref Range   WBC 7.5 4.0 - 10.5 K/uL   RBC 3.70 (L) 3.87 - 5.11 MIL/uL   Hemoglobin 10.4 (L) 12.0 - 15.0 g/dL   HCT 14.7 (L) 82.9 - 56.2 %   MCV 87.0 80.0 - 100.0 fL   MCH 28.1 26.0 - 34.0 pg   MCHC 32.3 30.0 - 36.0 g/dL   RDW 13.0 86.5 - 78.4 %   Platelets 272 150 - 400 K/uL   nRBC 0.0 0.0 - 0.2 %  Comprehensive metabolic panel   Collection Time: 11/11/22  4:46 PM  Result Value Ref Range   Sodium 134 (L) 135 - 145 mmol/L   Potassium 3.5 3.5 - 5.1 mmol/L   Chloride 103 98 - 111 mmol/L   CO2 21 (L) 22 - 32 mmol/L   Glucose, Bld 72 70 - 99 mg/dL   BUN 6 6 - 20 mg/dL   Creatinine, Ser 6.96 0.44 - 1.00 mg/dL   Calcium 9.2 8.9 - 29.5 mg/dL   Total Protein 6.6 6.5 - 8.1 g/dL   Albumin 3.1 (L) 3.5 - 5.0 g/dL   AST 17 15 - 41  U/L   ALT 12 0 - 44 U/L   Alkaline Phosphatase 118 38 - 126 U/L   Total Bilirubin 0.4 0.3 - 1.2 mg/dL   GFR, Estimated >84 >69 mL/min   Anion gap 10 5 - 15    Assessment & Plan:  1) Low-risk pregnancy [id number] at [redacted]w[redacted]d with an Estimated Date of Delivery: 12/20/22   2) Hx of placental abruption with IUFD,   3) Bilateral RPD, mild   Meds: No orders of the defined types were placed in this encounter.  Labs/procedures today:   Plan:  Continue routine obstetrical care  Next visit: prefers in person    Reviewed: Preterm labor symptoms and general obstetric precautions including but not limited to vaginal bleeding, contractions, leaking of fluid and fetal movement were reviewed in detail with the patient.  All questions were answered. Has home bp cuff. Rx faxed to . Check bp weekly, let us know if >140/90.   Follow-up: No follow-ups on file.  No orders of the defined types were placed in this encounter.   Lazaro Arms,  MD 11/12/2022 4:08 PM

## 2022-11-26 ENCOUNTER — Encounter: Payer: Self-pay | Admitting: Obstetrics & Gynecology

## 2022-11-26 ENCOUNTER — Other Ambulatory Visit (HOSPITAL_COMMUNITY)
Admission: RE | Admit: 2022-11-26 | Discharge: 2022-11-26 | Disposition: A | Discharge status: Home / Self Care | Payer: No Typology Code available for payment source | Source: Ambulatory Visit | Attending: Obstetrics & Gynecology | Admitting: Obstetrics & Gynecology

## 2022-11-26 ENCOUNTER — Ambulatory Visit: Payer: No Typology Code available for payment source | Admitting: Obstetrics & Gynecology

## 2022-11-26 VITALS — BP 115/67 | HR 90 | Wt 201.0 lb

## 2022-11-26 DIAGNOSIS — O0993 Supervision of high risk pregnancy, unspecified, third trimester: Secondary | ICD-10-CM

## 2022-11-26 DIAGNOSIS — Z8759 Personal history of other complications of pregnancy, childbirth and the puerperium: Secondary | ICD-10-CM

## 2022-11-26 DIAGNOSIS — Z3A36 36 weeks gestation of pregnancy: Secondary | ICD-10-CM

## 2022-11-26 DIAGNOSIS — O359XX0 Maternal care for (suspected) fetal abnormality and damage, unspecified, not applicable or unspecified: Secondary | ICD-10-CM

## 2022-11-26 NOTE — Progress Notes (Signed)
   Induction Assessment Scheduling Form: Fax to [facility]:  [phone]  Kinga Cassar                                                                                   DOB:  June 05, 1995                                                            MRN:  [id number]                                                                     Phone #:           [phone]                 Provider:  [facility]  GP:  [id number]                                                            Estimated Date of Delivery: 12/20/22  Dating Criteria: 1st trimester sonogram    Medical Indications for induction:  History of fetal demise due to abruption, severe pre eclampsia, MFM agreed with induction at 37-38 weeks Admission Date/Time:  12/06/22 am Gestational age on admission:  38wod   Filed Weights   11/26/22 1623  Weight: 201 lb (91.2 kg)   HIV:  Non Reactive (05/07 0912) GEX:[id number]    Cervical exam is unsure   Method of induction(proposed):  choice   Scheduling Provider Signature:  Lazaro Arms, MD                                            Today's Date:  11/26/2022

## 2022-11-26 NOTE — Progress Notes (Signed)
HIGH-RISK PREGNANCY VISIT Patient name: Jacqueline Barrera MRN [id number]  Date of birth: 02-11-1996 Chief Complaint:   Routine Prenatal Visit  History of Present Illness:   Jacqueline Barrera is a 27 y.o. [id number] female at [redacted]w[redacted]d with an Estimated Date of Delivery: 12/20/22 being seen today for ongoing management of a high-risk pregnancy complicated by Hx of severe Pre E with abruption and IUFD, bilateral RPD.    Today she reports no complaints. Contractions: Irritability. Vag. Bleeding: None.  Movement: Present. denies leaking of fluid.      06/10/2022    3:07 PM 06/01/2018   10:31 AM 06/10/2017    9:05 AM 08/28/2016    2:02 PM  Depression screen PHQ 2/9  Decreased Interest 2 0 2 1  Down, Depressed, Hopeless 2 0 0 0  PHQ - 2 Score 4 0 2 1  Altered sleeping 3  3 3   Tired, decreased energy 3  3 2   Change in appetite 3  0 2  Feeling bad or failure about yourself  0  0 0  Trouble concentrating 0  0 0  Moving slowly or fidgety/restless 0  0 0  Suicidal thoughts 0  0 0  PHQ-9 Score 13  8 8         06/10/2022    3:08 PM  GAD 7 : Generalized Anxiety Score  Nervous, Anxious, on Edge 3  Control/stop worrying 3  Worry too much - different things 3  Trouble relaxing 1  Restless 1  Easily annoyed or irritable 3  Afraid - awful might happen 0  Total GAD 7 Score 14     Review of Systems:   Pertinent items are noted in HPI Denies abnormal vaginal discharge w/ itching/odor/irritation, headaches, visual changes, shortness of breath, chest pain, abdominal pain, severe nausea/vomiting, or problems with urination or bowel movements unless otherwise stated above. Pertinent History Reviewed:  Reviewed past medical,surgical, social, obstetrical and family history.  Reviewed problem list, medications and allergies. Physical Assessment:   Vitals:   11/26/22 1623  BP: 115/67  Pulse: 90  Weight: 201 lb (91.2 kg)  Body mass index is 34.5 kg/m.           Physical Examination:   General appearance:  alert, well appearing, and in no distress  Mental status: alert, oriented to person, place, and time  Skin: warm & dry   Extremities: Edema: Trace    Cardiovascular: normal heart rate noted  Respiratory: normal respiratory effort, no distress  Abdomen: gravid, soft, non-tender  Pelvic: Cervical exam deferred         Fetal Status:     Movement: Present    Fetal Surveillance Testing today: FHT 136   Chaperone: Latisha Cresenzo    No results found for this or any previous visit (from the past 24 hour(s)).  Assessment & Plan:  High-risk pregnancy: J4N8295 at [redacted]w[redacted]d with an Estimated Date of Delivery: 12/20/22      ICD-10-CM   1. Supervision of high risk pregnancy in third trimester  O09.93 Cervicovaginal ancillary only    Culture, beta strep (group b only)    2. [redacted] weeks gestation of pregnancy  Z3A.36 Cervicovaginal ancillary only    Culture, beta strep (group b only)    3. Bilateral RPD-->inform peds at delivery  O35.9XX0     4. History of IUFD  Z87.59        Meds: No orders of the defined types were placed in this encounter.   Orders:  Orders Placed  This Encounter  Procedures   Culture, beta strep (group b only)     Labs/procedures today:  cultures  Treatment Plan:  IOL 12/06/22 am(38 weeks)  Reviewed: Preterm labor symptoms and general obstetric precautions including but not limited to vaginal bleeding, contractions, leaking of fluid and fetal movement were reviewed in detail with the patient.  All questions were answered. Does have home bp cuff. Office bp cuff given: not applicable. Check bp daily, let us know if consistently >140 and/or >90.  Follow-up: Return for keep scheduled.   Future Appointments  Date Time Provider Department Center  12/04/2022  3:30 PM Lazaro Arms, MD [facility]    Orders Placed This Encounter  Procedures   Culture, beta strep (group b only)   Lazaro Arms  Attending Physician for the Center for [facility] 11/26/2022 5:02 PM

## 2022-11-27 ENCOUNTER — Telehealth (HOSPITAL_COMMUNITY): Payer: Self-pay | Admitting: *Deleted

## 2022-11-27 ENCOUNTER — Encounter (HOSPITAL_COMMUNITY): Payer: Self-pay

## 2022-11-27 DIAGNOSIS — Z3A36 36 weeks gestation of pregnancy: Secondary | ICD-10-CM | POA: Diagnosis not present

## 2022-11-27 DIAGNOSIS — O0993 Supervision of high risk pregnancy, unspecified, third trimester: Secondary | ICD-10-CM | POA: Diagnosis not present

## 2022-11-27 NOTE — Telephone Encounter (Signed)
Preadmission screen  

## 2022-11-28 ENCOUNTER — Telehealth (HOSPITAL_COMMUNITY): Payer: Self-pay | Admitting: *Deleted

## 2022-11-28 ENCOUNTER — Encounter (HOSPITAL_COMMUNITY): Payer: Self-pay | Admitting: *Deleted

## 2022-11-28 LAB — CERVICOVAGINAL ANCILLARY ONLY
Chlamydia: NEGATIVE
Comment: NEGATIVE
Comment: NORMAL
Neisseria Gonorrhea: NEGATIVE

## 2022-11-28 NOTE — Telephone Encounter (Signed)
Preadmission screen  

## 2022-11-30 LAB — CULTURE, BETA STREP (GROUP B ONLY)

## 2022-12-04 ENCOUNTER — Ambulatory Visit (INDEPENDENT_AMBULATORY_CARE_PROVIDER_SITE_OTHER): Payer: No Typology Code available for payment source | Admitting: Obstetrics & Gynecology

## 2022-12-04 VITALS — BP 129/71 | HR 110 | Wt 202.0 lb

## 2022-12-04 DIAGNOSIS — O359XX0 Maternal care for (suspected) fetal abnormality and damage, unspecified, not applicable or unspecified: Secondary | ICD-10-CM

## 2022-12-04 DIAGNOSIS — O0993 Supervision of high risk pregnancy, unspecified, third trimester: Secondary | ICD-10-CM

## 2022-12-04 DIAGNOSIS — Z3A37 37 weeks gestation of pregnancy: Secondary | ICD-10-CM

## 2022-12-04 DIAGNOSIS — O09299 Supervision of pregnancy with other poor reproductive or obstetric history, unspecified trimester: Secondary | ICD-10-CM

## 2022-12-04 DIAGNOSIS — Z8759 Personal history of other complications of pregnancy, childbirth and the puerperium: Secondary | ICD-10-CM

## 2022-12-04 DIAGNOSIS — O09293 Supervision of pregnancy with other poor reproductive or obstetric history, third trimester: Secondary | ICD-10-CM

## 2022-12-04 NOTE — Progress Notes (Signed)
HIGH-RISK PREGNANCY VISIT Patient name: Jacqueline Barrera MRN [id number]  Date of birth: [birth year] Chief Complaint:   Routine Prenatal Visit  History of Present Illness:   Jacqueline Barrera is a 27 y.o. [id number] female at [redacted]w[redacted]d with an Estimated Date of Delivery: 12/20/22 being seen today for ongoing management of a high-risk pregnancy complicated by Hx of severe PreE with placental abruption and IUFD, bilateral RPD->notify Peds.    Today she reports no complaints. Contractions: Irritability. Vag. Bleeding: None.  Movement: Present. denies leaking of fluid.      06/10/2022    3:07 PM 06/01/2018   10:31 AM 06/10/2017    9:05 AM 08/28/2016    2:02 PM  Depression screen PHQ 2/9  Decreased Interest 2 0 2 1  Down, Depressed, Hopeless 2 0 0 0  PHQ - 2 Score 4 0 2 1  Altered sleeping 3  3 3   Tired, decreased energy 3  3 2   Change in appetite 3  0 2  Feeling bad or failure about yourself  0  0 0  Trouble concentrating 0  0 0  Moving slowly or fidgety/restless 0  0 0  Suicidal thoughts 0  0 0  PHQ-9 Score 13  8 8         06/10/2022    3:08 PM  GAD 7 : Generalized Anxiety Score  Nervous, Anxious, on Edge 3  Control/stop worrying 3  Worry too much - different things 3  Trouble relaxing 1  Restless 1  Easily annoyed or irritable 3  Afraid - awful might happen 0  Total GAD 7 Score 14     Review of Systems:   Pertinent items are noted in HPI Denies abnormal vaginal discharge w/ itching/odor/irritation, headaches, visual changes, shortness of breath, chest pain, abdominal pain, severe nausea/vomiting, or problems with urination or bowel movements unless otherwise stated above. Pertinent History Reviewed:  Reviewed past medical,surgical, social, obstetrical and family history.  Reviewed problem list, medications and allergies. Physical Assessment:   Vitals:   12/04/22 1536  BP: 129/71  Pulse: (!) 110  Weight: 202 lb (91.6 kg)  Body mass index is 34.67 kg/m.           Physical  Examination:   General appearance: alert, well appearing, and in no distress  Mental status: alert, oriented to person, place, and time  Skin: warm & dry   Extremities:      Cardiovascular: normal heart rate noted  Respiratory: normal respiratory effort, no distress  Abdomen: gravid, soft, non-tender  Pelvic: Cervical exam deferred         Fetal Status: Fetal Heart Rate (bpm): 144 Fundal Height: 38 cm Movement: Present Presentation: Vertex  Fetal Surveillance Testing today: FHR 144   Chaperone: N/A    No results found for this or any previous visit (from the past 24 hour(s)).  Assessment & Plan:  High-risk pregnancy: [id number] at [redacted]w[redacted]d with an Estimated Date of Delivery: 12/20/22      ICD-10-CM   1. Supervision of high risk pregnancy in third trimester  O09.93     2. History of IUFD  Z87.59     3. H/O preeclampsia  O09.299     4. Bilateral RPD-->inform peds at delivery  O35.9XX0         Meds: No orders of the defined types were placed in this encounter.   Orders: No orders of the defined types were placed in this encounter.    Labs/procedures today:   Treatment Plan:  IOL 2 days    Follow-up: No follow-ups on file.   Future Appointments  Date Time Provider Department Center  12/06/2022  7:15 AM [facility] SCHED ROOM [facility] None    No orders of the defined types were placed in this encounter.  Lazaro Arms  Attending Physician for the Center for [facility] 12/04/2022 4:09 PM

## 2022-12-06 ENCOUNTER — Inpatient Hospital Stay (HOSPITAL_COMMUNITY): Payer: No Typology Code available for payment source

## 2022-12-07 ENCOUNTER — Other Ambulatory Visit: Payer: Self-pay

## 2022-12-07 ENCOUNTER — Inpatient Hospital Stay (HOSPITAL_COMMUNITY): Payer: No Typology Code available for payment source | Admitting: Anesthesiology

## 2022-12-07 ENCOUNTER — Inpatient Hospital Stay (HOSPITAL_COMMUNITY)
Admission: RE | Admit: 2022-12-07 | Discharge: 2022-12-09 | DRG: 807 | Disposition: A | Discharge status: Home / Self Care | Payer: No Typology Code available for payment source | Attending: Obstetrics and Gynecology | Admitting: Obstetrics and Gynecology

## 2022-12-07 ENCOUNTER — Encounter (HOSPITAL_COMMUNITY): Payer: Self-pay | Admitting: Obstetrics and Gynecology

## 2022-12-07 DIAGNOSIS — Z7982 Long term (current) use of aspirin: Secondary | ICD-10-CM

## 2022-12-07 DIAGNOSIS — Z87891 Personal history of nicotine dependence: Secondary | ICD-10-CM | POA: Diagnosis not present

## 2022-12-07 DIAGNOSIS — O99214 Obesity complicating childbirth: Secondary | ICD-10-CM | POA: Diagnosis present

## 2022-12-07 DIAGNOSIS — O358XX0 Maternal care for other (suspected) fetal abnormality and damage, not applicable or unspecified: Secondary | ICD-10-CM | POA: Diagnosis present

## 2022-12-07 DIAGNOSIS — Z8759 Personal history of other complications of pregnancy, childbirth and the puerperium: Secondary | ICD-10-CM

## 2022-12-07 DIAGNOSIS — O09293 Supervision of pregnancy with other poor reproductive or obstetric history, third trimester: Secondary | ICD-10-CM

## 2022-12-07 DIAGNOSIS — Z3A38 38 weeks gestation of pregnancy: Secondary | ICD-10-CM

## 2022-12-07 DIAGNOSIS — Z8751 Personal history of pre-term labor: Secondary | ICD-10-CM

## 2022-12-07 DIAGNOSIS — O26893 Other specified pregnancy related conditions, third trimester: Principal | ICD-10-CM | POA: Diagnosis present

## 2022-12-07 DIAGNOSIS — O09299 Supervision of pregnancy with other poor reproductive or obstetric history, unspecified trimester: Secondary | ICD-10-CM

## 2022-12-07 DIAGNOSIS — Z3490 Encounter for supervision of normal pregnancy, unspecified, unspecified trimester: Secondary | ICD-10-CM | POA: Diagnosis present

## 2022-12-07 DIAGNOSIS — O0990 Supervision of high risk pregnancy, unspecified, unspecified trimester: Secondary | ICD-10-CM

## 2022-12-07 LAB — COMPREHENSIVE METABOLIC PANEL
ALT: 11 U/L (ref 0–44)
AST: 16 U/L (ref 15–41)
Albumin: 2.9 g/dL — ABNORMAL LOW (ref 3.5–5.0)
Alkaline Phosphatase: 140 U/L — ABNORMAL HIGH (ref 38–126)
Anion gap: 11 (ref 5–15)
BUN: 6 mg/dL (ref 6–20)
CO2: 19 mmol/L — ABNORMAL LOW (ref 22–32)
Calcium: 9.4 mg/dL (ref 8.9–10.3)
Chloride: 104 mmol/L (ref 98–111)
Creatinine, Ser: 0.72 mg/dL (ref 0.44–1.00)
GFR, Estimated: >60 mL/min (ref >60)
Glucose, Bld: 91 mg/dL (ref 70–99)
Potassium: 3.7 mmol/L (ref 3.5–5.1)
Sodium: 134 mmol/L — ABNORMAL LOW (ref 135–145)
Total Bilirubin: 0.4 mg/dL (ref 0.3–1.2)
Total Protein: 6.6 g/dL (ref 6.5–8.1)

## 2022-12-07 LAB — RPR: RPR Ser Ql: NONREACTIVE

## 2022-12-07 LAB — CBC
HCT: 31.9 % — ABNORMAL LOW (ref 36.0–46.0)
Hemoglobin: 10.4 g/dL — ABNORMAL LOW (ref 12.0–15.0)
MCH: 27.2 pg (ref 26.0–34.0)
MCHC: 32.6 g/dL (ref 30.0–36.0)
MCV: 83.3 fL (ref 80.0–100.0)
Platelets: 286 10*3/uL (ref 150–400)
RBC: 3.83 MIL/uL — ABNORMAL LOW (ref 3.87–5.11)
RDW: 13.8 % (ref 11.5–15.5)
WBC: 8.3 10*3/uL (ref 4.0–10.5)
nRBC: 0 % (ref 0.0–0.2)

## 2022-12-07 LAB — TYPE AND SCREEN
ABO/RH(D): O POS
Antibody Screen: NEGATIVE

## 2022-12-07 LAB — PROTEIN / CREATININE RATIO, URINE
Creatinine, Urine: 79 mg/dL
Protein Creatinine Ratio: 0.11 mg/mg{Cre} (ref 0.00–0.15)
Total Protein, Urine: 9 mg/dL

## 2022-12-07 MED ORDER — OXYCODONE-ACETAMINOPHEN 5-325 MG PO TABS: 1.0000 | ORAL_TABLET | ORAL | Status: DC | PRN

## 2022-12-07 MED ORDER — EPHEDRINE 5 MG/ML INJ: 10.0000 mg | INTRAVENOUS | Status: DC | PRN

## 2022-12-07 MED ORDER — OXYTOCIN-SODIUM CHLORIDE 30-0.9 UT/500ML-% IV SOLN
1.0000 m[IU]/min | INTRAVENOUS | Status: DC
  Filled 2022-12-07: qty 500

## 2022-12-07 MED ORDER — OXYCODONE-ACETAMINOPHEN 5-325 MG PO TABS: 2.0000 | ORAL_TABLET | ORAL | Status: DC | PRN

## 2022-12-07 MED ORDER — SODIUM CHLORIDE 0.9% FLUSH: 3.0000 mL | INTRAVENOUS | Status: DC | PRN

## 2022-12-07 MED ORDER — ACETAMINOPHEN 325 MG PO TABS
650.0000 mg | ORAL_TABLET | ORAL | Status: DC | PRN
  Administered 2022-12-08: 650 mg via ORAL
  Filled 2022-12-07: qty 2

## 2022-12-07 MED ORDER — BENZOCAINE-MENTHOL 20-0.5 % EX AERO: 1.0000 | INHALATION_SPRAY | CUTANEOUS | Status: DC | PRN

## 2022-12-07 MED ORDER — PHENYLEPHRINE 80 MCG/ML (10ML) SYRINGE FOR IV PUSH (FOR BLOOD PRESSURE SUPPORT): 80.0000 ug | PREFILLED_SYRINGE | INTRAVENOUS | Status: DC | PRN

## 2022-12-07 MED ORDER — COCONUT OIL OIL: 1.0000 | TOPICAL_OIL | Status: DC | PRN

## 2022-12-07 MED ORDER — MISOPROSTOL 50MCG HALF TABLET
50.0000 ug | ORAL_TABLET | Freq: Once | ORAL | Status: AC
  Administered 2022-12-07: 50 ug via ORAL

## 2022-12-07 MED ORDER — MISOPROSTOL 50MCG HALF TABLET
ORAL_TABLET | ORAL | Status: AC
  Filled 2022-12-07: qty 1

## 2022-12-07 MED ORDER — PRENATAL MULTIVITAMIN CH
1.0000 | ORAL_TABLET | Freq: Every day | ORAL | Status: DC
  Filled 2022-12-07 (×2): qty 1

## 2022-12-07 MED ORDER — FENTANYL CITRATE (PF) 100 MCG/2ML IJ SOLN
50.0000 ug | INTRAMUSCULAR | Status: DC | PRN
  Administered 2022-12-07: 100 ug via INTRAVENOUS
  Filled 2022-12-07: qty 2

## 2022-12-07 MED ORDER — SENNOSIDES-DOCUSATE SODIUM 8.6-50 MG PO TABS
2.0000 | ORAL_TABLET | ORAL | Status: DC
  Filled 2022-12-07: qty 2

## 2022-12-07 MED ORDER — WITCH HAZEL-GLYCERIN EX PADS: 1.0000 | MEDICATED_PAD | CUTANEOUS | Status: DC | PRN

## 2022-12-07 MED ORDER — FENTANYL-BUPIVACAINE-NACL 0.5-0.125-0.9 MG/250ML-% EP SOLN
EPIDURAL | Status: DC | PRN
  Administered 2022-12-07: 12 mL/h via EPIDURAL

## 2022-12-07 MED ORDER — OXYTOCIN BOLUS FROM INFUSION
333.0000 mL | Freq: Once | INTRAVENOUS | Status: AC
  Administered 2022-12-07: 333 mL via INTRAVENOUS

## 2022-12-07 MED ORDER — SODIUM CHLORIDE 0.9% FLUSH
3.0000 mL | Freq: Two times a day (BID) | INTRAVENOUS | Status: DC
  Administered 2022-12-07 – 2022-12-08 (×2): 3 mL via INTRAVENOUS

## 2022-12-07 MED ORDER — SIMETHICONE 80 MG PO CHEW: 80.0000 mg | CHEWABLE_TABLET | ORAL | Status: DC | PRN

## 2022-12-07 MED ORDER — DIPHENHYDRAMINE HCL 25 MG PO CAPS: 25.0000 mg | ORAL_CAPSULE | Freq: Four times a day (QID) | ORAL | Status: DC | PRN

## 2022-12-07 MED ORDER — SOD CITRATE-CITRIC ACID 500-334 MG/5ML PO SOLN: 30.0000 mL | ORAL | Status: DC | PRN

## 2022-12-07 MED ORDER — MISOPROSTOL 50MCG HALF TABLET
50.0000 ug | ORAL_TABLET | Freq: Once | ORAL | Status: AC
  Administered 2022-12-07: 50 ug via ORAL
  Filled 2022-12-07: qty 1

## 2022-12-07 MED ORDER — ONDANSETRON HCL 4 MG PO TABS: 4.0000 mg | ORAL_TABLET | ORAL | Status: DC | PRN

## 2022-12-07 MED ORDER — TERBUTALINE SULFATE 1 MG/ML IJ SOLN: 0.2500 mg | Freq: Once | INTRAMUSCULAR | Status: DC | PRN

## 2022-12-07 MED ORDER — ACETAMINOPHEN 325 MG PO TABS: 650.0000 mg | ORAL_TABLET | ORAL | Status: DC | PRN

## 2022-12-07 MED ORDER — ONDANSETRON HCL 4 MG/2ML IJ SOLN: 4.0000 mg | INTRAMUSCULAR | Status: DC | PRN

## 2022-12-07 MED ORDER — DIBUCAINE (PERIANAL) 1 % EX OINT: 1.0000 | TOPICAL_OINTMENT | CUTANEOUS | Status: DC | PRN

## 2022-12-07 MED ORDER — OXYTOCIN-SODIUM CHLORIDE 30-0.9 UT/500ML-% IV SOLN: 2.5000 [IU]/h | INTRAVENOUS | Status: DC

## 2022-12-07 MED ORDER — DIPHENHYDRAMINE HCL 50 MG/ML IJ SOLN: 12.5000 mg | INTRAMUSCULAR | Status: DC | PRN

## 2022-12-07 MED ORDER — LACTATED RINGERS IV SOLN: INTRAVENOUS | Status: DC

## 2022-12-07 MED ORDER — OXYTOCIN-SODIUM CHLORIDE 30-0.9 UT/500ML-% IV SOLN
1.0000 m[IU]/min | INTRAVENOUS | Status: DC
  Administered 2022-12-07: 2 m[IU]/min via INTRAVENOUS

## 2022-12-07 MED ORDER — LACTATED RINGERS IV SOLN: 500.0000 mL | Freq: Once | INTRAVENOUS | Status: DC

## 2022-12-07 MED ORDER — MISOPROSTOL 25 MCG QUARTER TABLET
25.0000 ug | ORAL_TABLET | Freq: Once | ORAL | Status: AC
  Administered 2022-12-07: 25 ug via VAGINAL
  Filled 2022-12-07: qty 1

## 2022-12-07 MED ORDER — LIDOCAINE HCL (PF) 1 % IJ SOLN: 30.0000 mL | INTRAMUSCULAR | Status: DC | PRN

## 2022-12-07 MED ORDER — SODIUM CHLORIDE 0.9 % IV SOLN: 250.0000 mL | INTRAVENOUS | Status: DC | PRN

## 2022-12-07 MED ORDER — LACTATED RINGERS IV SOLN: 500.0000 mL | INTRAVENOUS | Status: DC | PRN

## 2022-12-07 MED ORDER — ONDANSETRON HCL 4 MG/2ML IJ SOLN: 4.0000 mg | Freq: Four times a day (QID) | INTRAMUSCULAR | Status: DC | PRN

## 2022-12-07 MED ORDER — IBUPROFEN 600 MG PO TABS
600.0000 mg | ORAL_TABLET | Freq: Four times a day (QID) | ORAL | Status: DC
  Administered 2022-12-07 – 2022-12-09 (×5): 600 mg via ORAL
  Filled 2022-12-07 (×5): qty 1

## 2022-12-07 MED ORDER — ZOLPIDEM TARTRATE 5 MG PO TABS: 5.0000 mg | ORAL_TABLET | Freq: Every evening | ORAL | Status: DC | PRN

## 2022-12-07 MED ORDER — LIDOCAINE HCL (PF) 1 % IJ SOLN
INTRAMUSCULAR | Status: DC | PRN
  Administered 2022-12-07: 2 mL via EPIDURAL
  Administered 2022-12-07: 10 mL via EPIDURAL

## 2022-12-07 MED ORDER — FENTANYL-BUPIVACAINE-NACL 0.5-0.125-0.9 MG/250ML-% EP SOLN
12.0000 mL/h | EPIDURAL | Status: DC | PRN
  Filled 2022-12-07: qty 250

## 2022-12-07 NOTE — Progress Notes (Signed)
Labor Progress Note  Jacqueline Barrera is a 27 y.o. [id number] at [redacted]w[redacted]d presented for IOL 2/2 hx of IUFD.  S: Patient is doing well and is comfortable. She is hesitant to have a foley balloon placed for mechanical ripening but is agreeable to try if necessary.   O:  BP 117/60 (BP Location: Left Arm)   Pulse 82   Temp 98.6 F (37 C) (Oral)   Resp 17   Ht 5\' 4"  (1.626 m)   Wt 92.8 kg   LMP 03/23/2022 (Approximate)   BMI 35.12 kg/m  EFM:145 bpm/Moderate variability/ 15x15 accels/ Early decels CAT: 1 Toco: contracting every 1-4 minutes   CVE: Dilation: 4 Effacement (%): 70 Cervical Position: Posterior Station: -2 Presentation: Vertex Exam by:: Carley Hammed, MD   A&P: 27 y.o. J4N8295 [redacted]w[redacted]d  here for IOL 2/2 hx of IUFD.  #Labor: Progressing well, no need for foley balloon placement. Will initiate pitocin.  #Pain: Epidural #FWB: CAT 1 #GBS negative  Glee Arvin, MD FMOB Fellow, Faculty practice [facility], Center for [facility] 12/07/22  2:21 PM

## 2022-12-07 NOTE — Discharge Summary (Shared)
Postpartum Discharge Summary  Date of Service updated***     Patient Name: Jacqueline Barrera DOB: [birth year] MRN: [id number]  Date of admission: 12/07/2022 Delivery date:12/07/2022 Delivering provider: Ennis Forts Date of discharge: 12/07/2022  Admitting diagnosis: Encounter for induction of labor [Z34.90] Intrauterine pregnancy: [redacted]w[redacted]d     Secondary diagnosis:  Principal Problem:   Encounter for induction of labor Active Problems:   History of IUFD   H/O preeclampsia   History of preterm delivery   Pregnancy, supervision, high-risk   Vaginal delivery  Additional problems: ***    Discharge diagnosis: Term Pregnancy Delivered                                              Post partum procedures:{Postpartum procedures:23558} Augmentation: AROM, Pitocin, Cytotec, and IP Foley Complications: None  Hospital course: Induction of Labor With Vaginal Delivery   27 y.o. yo W1U2725 at [redacted]w[redacted]d was admitted to the hospital 12/07/2022 for induction of labor.  Indication for induction:  hx IUFD .  Patient had an uncomplicated labor course.  Membrane Rupture Time/Date: 6:50 PM,12/07/2022  Delivery Method:Vaginal, Spontaneous Episiotomy: None Lacerations:  None Details of delivery can be found in separate delivery note.  Patient had a postpartum course complicated by***. Patient is discharged home 12/07/22.  Newborn Data: Birth date:12/07/2022 Birth time:7:36 PM Gender:Female Living status:Living Apgars:9 ,9  Weight:   Magnesium Sulfate received: No BMZ received: No Rhophylac:N/A MMR:N/A T-DaP:{Tdap:23962} Flu: N/A Transfusion:{Transfusion received:30440034}  Physical exam  Vitals:   12/07/22 1805 12/07/22 1811 12/07/22 1830 12/07/22 1900  BP: 127/72 125/73 122/71 (!) 142/99  Pulse: 90 82 86 (!) 101  Resp:  18 18 18   Temp:  98.7 F (37.1 C)    TempSrc:  Oral    Weight:      Height:       General: {Exam; general:21111117} Lochia: {Desc;  appropriate/inappropriate:30686::"appropriate"} Uterine Fundus: {Desc; firm/soft:30687} Incision: {Exam; incision:21111123} DVT Evaluation: {Exam; dvt:2111122} Labs: Lab Results  Component Value Date   WBC 8.3 12/07/2022   HGB 10.4 (L) 12/07/2022   HCT 31.9 (L) 12/07/2022   MCV 83.3 12/07/2022   PLT 286 12/07/2022      Latest Ref Rng & Units 12/07/2022    1:36 AM  CMP  Glucose 70 - 99 mg/dL 91   BUN 6 - 20 mg/dL 6   Creatinine 3.66 - 4.40 mg/dL 3.47   Sodium 425 - 956 mmol/L 134   Potassium 3.5 - 5.1 mmol/L 3.7   Chloride 98 - 111 mmol/L 104   CO2 22 - 32 mmol/L 19   Calcium 8.9 - 10.3 mg/dL 9.4   Total Protein 6.5 - 8.1 g/dL 6.6   Total Bilirubin 0.3 - 1.2 mg/dL 0.4   Alkaline Phos 38 - 126 U/L 140   AST 15 - 41 U/L 16   ALT 0 - 44 U/L 11    Edinburgh Score:    12/25/2017   12:15 PM  Edinburgh Postnatal Depression Scale Screening Tool  I have been able to laugh and see the funny side of things. 0  I have looked forward with enjoyment to things. 0  I have blamed myself unnecessarily when things went wrong. 0  I have been anxious or worried for no good reason. 0  I have felt scared or panicky for no good reason. 0  Things have been getting on top  of me. 0  I have been so unhappy that I have had difficulty sleeping. 0  I have felt sad or miserable. 0  I have been so unhappy that I have been crying. 0  The thought of harming myself has occurred to me. 0  Edinburgh Postnatal Depression Scale Total 0     After visit meds:  Allergies as of 12/07/2022       Reactions   Other Nausea And Vomiting   jalapenos     Med Rec must be completed prior to using this [facility]***        Discharge home in stable condition Infant Feeding: {Baby feeding:23562} Infant Disposition:{CHL IP OB HOME WITH UXLKGM:01027} Discharge instruction: per After Visit Summary and Postpartum booklet. Activity: Advance as tolerated. Pelvic rest for 6 weeks.  Diet: {OB OZDG:64403474} Future  Appointments:No future appointments. Follow up Visit:   Please schedule this patient for a In person postpartum visit in 4 weeks with the following provider: Any provider. Additional Postpartum F/U: NA   High risk pregnancy complicated by:  Hx IUFD  Delivery mode:  Vaginal, Spontaneous Anticipated Birth Control:   Undecided at this time.    12/07/2022 Celedonio Savage, MD

## 2022-12-07 NOTE — Progress Notes (Signed)
Labor Progress Note  Jacqueline Barrera is a 27 y.o. N8G9562 at [redacted]w[redacted]d presented for IOL 2/2 hx of IUFD.  S: Patient is feeling extremely uncomfortable with contractions  O:  BP 131/69   Pulse 95   Temp 98.6 F (37 C) (Oral)   Resp 18   Ht 5\' 4"  (1.626 m)   Wt 92.8 kg   LMP 03/23/2022 (Approximate)   BMI 35.12 kg/m  EFM:145 bpm/Moderate variability/ 15x15 accels/ Early decels CAT: 1 Toco: contracting every 1-4 minutes   CVE: Dilation: 5 Effacement (%): 70 Cervical Position: Posterior Station: -3 Presentation: Vertex Exam by:: Dameon Soltis, MD   A&P: 27 y.o. Z3Y8657 [redacted]w[redacted]d  here for IOL 2/2 hx of IUFD.  #Labor: Pitocin running.  Progressing well.  Requesting epidural.  Discussed AROM and patient will get epidural and then I will AROM. #Pain: Epidural #FWB: CAT 1 #GBS negative  Celedonio Savage, MD FMOB Fellow, Faculty practice [facility], Center for [facility] 12/07/22  5:50 PM

## 2022-12-07 NOTE — H&P (Signed)
OBSTETRIC ADMISSION HISTORY AND PHYSICAL  Jacqueline Barrera is a 27 y.o. female [phone] with IUP at [redacted]w[redacted]d by 10 wk [place] presenting for IOL history of IUFD. She reports +FMs, No LOF, no VB, no blurry vision, headaches or peripheral edema, and RUQ pain.  She plans on bottle feeding. She request possible salpingectomy for birth control. She received her prenatal care at [facility]   Dating: By ultrasound --->  Estimated Date of Delivery: 12/20/22  Sono:    @30  w 5 d, CWD, normal anatomy, cephalic presentation, 1611 g, 35% EFW   Prenatal History/Complications:  Patient Active Problem List   Diagnosis Date Noted   Encounter for induction of labor 12/07/2022   Pregnancy, supervision, high-risk 06/10/2022   Nausea 04/06/2020   History of preterm delivery 12/25/2017   History of IUFD 06/10/2017   H/O preeclampsia 06/10/2017   Marijuana use 08/28/2016     FAMILY TREE  RESULTS  Language English Pap 2/24 neg  Initiated care at 12wks GC/CT Initial:  -/-          36wks:  Dating by 10wk u/s    Support person  Genetics NT/IT/AFP/Panorama: declined  BP cuff rx'd Carrier Screen declined    [place]/Hgb Elec   Rhogam     TDaP vaccine declined Blood Type O/Positive/-- (01/22 1549)  Flu vaccine  Antibody Negative (05/07 0912)  Covid vaccine  HBsAg Negative (01/22 1549)    RPR Non Reactive (05/07 0912)  Anatomy US Female, bilateral RPD Rubella  2.37 (01/22 1549)  Feeding Plan both HIV Non Reactive (05/07 0912)  Contraception salpingectomy Hep C neg  Circumcision yes    Pediatrician [facility] A1C/GTT Early:      26-28wks:neg  Prenatal Classes discussed      GBS Negative/-- (07/10 1330)   [ ]  PCN allergy  BTL Consent  10/16/22- completed    VBAC Consent  PHQ9 & GAD7  [ ] New OB  [ ] 28wks   [ ] 36wks  Waterbirth [ ] Class [ ]  36wkCNM visit/consent       Past Medical History: Past Medical History:  Diagnosis Date   Anxiety    History of pre-eclampsia in prior pregnancy, currently pregnant     Scoliosis    Trichimoniasis 05/04/2021   05/04/21 rx flagyl    Past Surgical History: Past Surgical History:  Procedure Laterality Date   ADENOIDECTOMY     TONSILLECTOMY AND ADENOIDECTOMY     WISDOM TOOTH EXTRACTION      Obstetrical History: OB History     Gravida  5   Para  2   Term      Preterm  2   AB  2   Living  1      SAB  0   IAB  2   Ectopic      Multiple  0   Live Births  1           Social History Social History   Socioeconomic History   Marital status: Single    Spouse name: Not on file   Number of children: 1   Years of education: Not on file   Highest education level: Not on file  Occupational History   Not on file  Tobacco Use   Smoking status: Former    Types: Cigars   Smokeless tobacco: Never   Tobacco comments:    smokes 1 cigar daily  Vaping Use   Vaping status: Never Used  Substance and Sexual Activity   Alcohol use:  Not Currently    Alcohol/week: 35.0 standard drinks of alcohol    Types: 35 Shots of liquor per week    Comment: "on Saturday's"   Drug use: Not Currently    Frequency: 7.0 times per week    Types: Marijuana    Comment: Stopped May 2024   Sexual activity: Not Currently    Partners: Male    Birth control/protection: None  Other Topics Concern   Not on file  Social History Narrative   Not on file   Social Determinants of Health   Financial Resource Strain: Low Risk  (06/10/2022)   Overall Financial Resource Strain (CARDIA)    Difficulty of Paying Living Expenses: Not hard at all  Food Insecurity: No Food Insecurity (06/10/2022)   Hunger Vital Sign    Worried About Running Out of Food in the Last Year: Never true    Ran Out of Food in the Last Year: Never true  Transportation Needs: No Transportation Needs (06/10/2022)   PRAPARE - Administrator, Civil Service (Medical): No    Lack of Transportation (Non-Medical): No  Physical Activity: Insufficiently Active (06/10/2022)   Exercise Vital  Sign    Days of Exercise per Week: 1 day    Minutes of Exercise per Session: 10 min  Stress: Stress Concern Present (06/10/2022)   [employer] of Occupational Health - Occupational Stress Questionnaire    Feeling of Stress : To some extent  Social Connections: Moderately Integrated (06/10/2022)   Social Connection and Isolation Panel [NHANES]    Frequency of Communication with Friends and Family: More than three times a week    Frequency of Social Gatherings with Friends and Family: More than three times a week    Attends Religious Services: 1 to 4 times per year    Active Member of [employer] or Organizations: No    Attends Banker Meetings: Never    Marital Status: Living with partner    Family History: Family History  Problem Relation Age of Onset   Healthy Mother    Hyperlipidemia Maternal Aunt    Hypertension Maternal Grandmother     Allergies: Allergies  Allergen Reactions   Other Nausea And Vomiting    jalapenos    Medications Prior to Admission  Medication Sig Dispense Refill Last Dose   aspirin EC 81 MG tablet Take 2 tablets (162 mg total) by mouth daily. Swallow whole. 180 tablet 2    Blood Pressure Monitor MISC For regular home bp monitoring during pregnancy (Patient not taking: Reported on 12/04/2022) 1 each 0    ferrous sulfate 325 (65 FE) MG EC tablet Take 1 tablet (325 mg total) by mouth 2 (two) times daily. (Patient not taking: Reported on 11/12/2022) 60 tablet 3    Prenatal Vit-Fe Fumarate-FA (PRENATAL VITAMIN PO) Take by mouth.        Review of Systems   All systems reviewed and negative except as stated in HPI  Last menstrual period 03/23/2022. General appearance: alert, cooperative, and appears stated age Lungs: clear to auscultation bilaterally Heart: regular rate and rhythm Abdomen: soft, non-tender; bowel sounds normal Pelvic: No lesions Extremities: Homans sign is negative, no sign of DVT  Presentation: cephalic Fetal  monitoringBaseline: 140 bpm, Variability: Good {> 6 bpm), Accelerations: Reactive, and Decelerations: Absent Uterine activityNone     Prenatal labs: ABO, Rh: O/Positive/-- (01/22 1549) Antibody: Negative (05/07 0912) Rubella: 2.37 (01/22 1549) RPR: Non Reactive (05/07 0912)  HBsAg: Negative (01/22 1549)  HIV: Non  Reactive (05/07 0912)  GBS: Negative/-- (07/10 1330)  1 hr Glucola normal Genetic screening declined Anatomy US renal pelvis dilation bilaterally  Prenatal Transfer Tool  Maternal Diabetes: No Genetic Screening: Declined Maternal Ultrasounds/Referrals: Fetal renal pyelectasis Fetal Ultrasounds or other Referrals:  Referred to Materal Fetal Medicine  Maternal Substance Abuse:  No Significant Maternal Medications:  None Significant Maternal Lab Results:  Group B Strep negative Number of Prenatal Visits:greater than 3 verified prenatal visits Other Comments:  None  No results found for this or any previous visit (from the past 24 hour(s)).  Patient Active Problem List   Diagnosis Date Noted   Encounter for induction of labor 12/07/2022   Pregnancy, supervision, high-risk 06/10/2022   Nausea 04/06/2020   History of preterm delivery 12/25/2017   History of IUFD 06/10/2017   H/O preeclampsia 06/10/2017   Marijuana use 08/28/2016    Assessment/Plan:  Shavana Calder is a 27 y.o. [id number] at [redacted]w[redacted]d here for IOL for history of IUFD  #Labor: Plan Cytotec 50/25 mcg p.o./vaginal #Pain: Per patient request #FWB: Category 1 #ID:  GBS negative #MOF: Breast and bottle #MOC: Salpingectomy #Circ:  Yes # History of preeclampsia.  Blood pressures throughout pregnancy have been normal to mild range.  Pre-e labs collected today. # Renal pelvis dilation bilaterally: Will notify peds upon delivery.  Lahoma Crocker Mercado-Ortiz, DO  12/07/2022, 1:20 AM

## 2022-12-07 NOTE — Anesthesia Preprocedure Evaluation (Signed)
Anesthesia Evaluation  Patient identified by MRN, date of birth, ID band Patient awake    Reviewed: Allergy & Precautions, Patient's Chart, lab work & pertinent test results  Airway Mallampati: II  TM Distance: >3 FB Neck ROM: Full    Dental no notable dental hx.    Pulmonary neg pulmonary ROS, former smoker   Pulmonary exam normal breath sounds clear to auscultation       Cardiovascular negative cardio ROS Normal cardiovascular exam Rhythm:Regular Rate:Normal     Neuro/Psych  PSYCHIATRIC DISORDERS Anxiety     negative neurological ROS     GI/Hepatic negative GI ROS, Neg liver ROS,,,  Endo/Other  Obesity BMI 35  Renal/GU negative Renal ROS  negative genitourinary   Musculoskeletal Visible scoliosis mostly thoracic, has never been operated on   Abdominal  (+) + obese  Peds negative pediatric ROS (+)  Hematology negative hematology ROS (+)   Anesthesia Other Findings   Reproductive/Obstetrics (+) Pregnancy                             Anesthesia Physical Anesthesia Plan  ASA: 2  Anesthesia Plan: Epidural   Post-op Pain Management:    Induction:   PONV Risk Score and Plan: 2  Airway Management Planned: Natural Airway  Additional Equipment: None  Intra-op Plan:   Post-operative Plan:   Informed Consent: I have reviewed the patients History and Physical, chart, labs and discussed the procedure including the risks, benefits and alternatives for the proposed anesthesia with the patient or authorized representative who has indicated his/her understanding and acceptance.       Plan Discussed with:   Anesthesia Plan Comments:        Anesthesia Quick Evaluation

## 2022-12-07 NOTE — Progress Notes (Signed)
Labor Progress Note  Jacqueline Barrera is a 27 y.o. [phone] at [redacted]w[redacted]d presented for IOL due to hx of IUFD   S: Patient is doing well and is not feeling contractions too intensely after her cytotec.   O:  BP 126/76   Pulse 82   Temp 98.5 F (36.9 C) (Oral)   Resp 17   Ht 5\' 4"  (1.626 m)   Wt 92.8 kg   LMP 03/23/2022 (Approximate)   BMI 35.12 kg/m  EFM:140 bpm/Moderate variability/ 15x15 accels/ None decels CAT: 1 Toco: contracting every 2-6 minutes.    CVE: Dilation: Fingertip Effacement (%): 60 Cervical Position: Posterior Station: -3 Presentation: Vertex Exam by:: Lajuana Matte, RNC   A&P: 27 y.o. [id number] [redacted]w[redacted]d  here for IOL 2/2 Hx of IUFD.   #Labor: Recommended placing Foley balloon for cervical ripening, however, patient declines. She wishes to continue with cytotec. 2nd round of cytotec administered orally.  #Pain: Planning on epidural  #FWB: CAT 1 #GBS negative  Glee Arvin, MD [facility] PGY-3 12/07/22  9:30 AM

## 2022-12-07 NOTE — Anesthesia Postprocedure Evaluation (Signed)
Anesthesia Post Note  Patient: Jacqueline Barrera  Procedure(s) Performed: AN AD HOC LABOR EPIDURAL     Patient location during evaluation: Mother Baby Anesthesia Type: Epidural Level of consciousness: awake and alert Pain management: pain level controlled Vital Signs Assessment: post-procedure vital signs reviewed and stable Respiratory status: spontaneous breathing, nonlabored ventilation and respiratory function stable Cardiovascular status: stable Postop Assessment: no headache, no backache and epidural receding Anesthetic complications: no   No notable events documented.  Last Vitals:  Vitals:   12/07/22 2045 12/07/22 2100  BP: 137/89 118/76  Pulse: 97 93  Resp:    Temp:      Last Pain:  Vitals:   12/07/22 2000  TempSrc:   PainSc: 0-No pain   Pain Goal: Patients Stated Pain Goal: 0 (12/07/22 0600)                 Marquerite Forsman

## 2022-12-07 NOTE — Anesthesia Procedure Notes (Signed)
Epidural Patient location during procedure: OB Start time: 12/07/2022 5:28 PM End time: 12/07/2022 5:44 PM  Staffing Anesthesiologist: Lannie Fields, DO Performed: anesthesiologist   Preanesthetic Checklist Completed: patient identified, IV checked, risks and benefits discussed, monitors and equipment checked, pre-op evaluation and timeout performed  Epidural Patient position: sitting Prep: DuraPrep and site prepped and draped Patient monitoring: continuous pulse ox, blood pressure, heart rate and cardiac monitor Approach: midline Location: L3-L4 Injection technique: LOR air  Needle:  Needle type: Tuohy  Needle gauge: 17 G Needle length: 9 cm Needle insertion depth: 6 cm Catheter type: closed end flexible Catheter size: 19 Gauge Catheter at skin depth: 11 cm Test dose: negative  Assessment Sensory level: T8 Events: blood not aspirated, no cerebrospinal fluid, injection not painful, no injection resistance, no paresthesia and negative IV test  Additional Notes Patient identified. Risks/Benefits/Options discussed with patient including but not limited to bleeding, infection, nerve damage, paralysis, failed block, incomplete pain control, headache, blood pressure changes, nausea, vomiting, reactions to medication both or allergic, itching and postpartum back pain. Confirmed with bedside nurse the patient's most recent platelet count. Confirmed with patient that they are not currently taking any anticoagulation, have any bleeding history or any family history of bleeding disorders. Patient expressed understanding and wished to proceed. All questions were answered. Sterile technique was used throughout the entire procedure. Please see nursing notes for vital signs. Test dose was given through epidural catheter and negative prior to continuing to dose epidural or start infusion. Warning signs of high block given to the patient including shortness of breath, tingling/numbness in  hands, complete motor block, or any concerning symptoms with instructions to call for help. Patient was given instructions on fall risk and not to get out of bed. All questions and concerns addressed with instructions to call with any issues or inadequate analgesia.    Uncontrollable sobbing throughout procedure, requiring IV fentanyl for sedation. Slight scoliosis, easy epidural.Reason for block:procedure for pain

## 2022-12-08 NOTE — Progress Notes (Signed)
Pt had previously signed consent for a tubal and was deciding when admitted to [facility]. Pt decided she does not want a tubal, and is deciding on an alternate birth control method. L&D nurse called to removed pts epidural.   Jeralyn Bennett RN

## 2022-12-08 NOTE — Progress Notes (Addendum)
POSTPARTUM PROGRESS NOTE  Subjective: Jacqueline Barrera is a 27 y.o. [id number] s/p SVD at [redacted]w[redacted]d.  She reports she is doing well. No acute events overnight. She denies any problems with ambulating, voiding or po intake. Denies nausea or vomiting. She has  passed flatus. Pain is well controlled.  Lochia is appropriate.  Objective: Blood pressure 118/79, pulse 74, temperature 97.7 F (36.5 C), temperature source Oral, resp. rate 16, height 5\' 4"  (1.626 m), weight 92.8 kg, last menstrual period 03/23/2022, SpO2 100%, unknown if currently breastfeeding.  Physical Exam:  General: alert, cooperative and no distress Chest: no respiratory distress Abdomen: soft, non-tender  Uterine Fundus: firm and at level of umbilicus Extremities: No calf swelling or tenderness  no LE edema  Recent Labs    12/07/22 0136  HGB 10.4*  HCT 31.9*    Assessment/Plan: Jacqueline Barrera is a 27 y.o. [id number] s/p VD at [redacted]w[redacted]d for IOL 2/2 hx of IUFD.  Routine Postpartum Care: Doing well, pain well-controlled.  -- Continue routine care, lactation support  -- Contraception: Pt no longer wants a BTL and now wants to start OCPs outpatient -- Feeding: bottle  Dispo: Plan for discharge PPD#2.  Para March, DO Faculty Practice, [facility] [employer] 12/08/2022 7:40 AM  Attestation of Attending Supervision of Resident Physician (MD/DO): Evaluation, management, and procedures were performed by the Resident Physician under my supervision and collaboration. I have reviewed the Resident Physician's note and chart, and I agree with the management and plan.  Patient with one elevated postpartum BP. If has a second, will start procardia.    Baldemar Lenis, MD, [facility] Attending [facility] [employer] (Faculty Practice)  12/08/2022 10:11 AM

## 2022-12-08 NOTE — Progress Notes (Signed)
Notified Dr. Ladon Applebaum of elevated blood pressures this morning. Dr. Ladon Applebaum ordered to recheck blood pressure around 1200 noon and notify her of result. Dr. Ladon Applebaum will make decision after next BP check. Earl Gala, [facility] [place]

## 2022-12-08 NOTE — Clinical Social Work Maternal (Signed)
CLINICAL SOCIAL WORK MATERNAL/CHILD NOTE   Patient Details  Name: Jacqueline Barrera MRN: [id number] Date of Birth:    Date:  [birth year]/20[birth year]   Clinical Social Worker Initiating Note:  Albertine Patricia, LCSWA            Date/Time: Initiated:  12/08/22/1805          Child's Name:  Jacqueline Barrera    Biological Parents:  Mother, Father (FOB: Laquon Emel DOB: 08/10/1988)    Need for Interpreter:  None    Reason for Referral:  Behavioral Health Concerns, Current Substance Use/Substance Use During Pregnancy      Address:  [address] 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   Strengths:  Ability to meet basic needs  , Home prepared for child  , Pediatrician chosen    Psychotropic Medications:          Pediatrician:    [facility]   Pediatrician List:    [facility]  [facility]       Pediatrician Fax Number:     Risk Factors/Current Problems:  Substance Use  , Mental Health Concerns      Cognitive State:  Able to  Concentrate  , Alert  , Goal Oriented  , Linear Thinking      Mood/Affect:  Comfortable  , Interested  , Calm  , Relaxed      CSW Assessment: CSW was consulted due to marijuana use during pregnancy and history of depression and anxiety. CSW met with MOB at bedside to complete assessment. When CSW entered room, MOB was observed sitting in hospital bed, holding infant. MOB's mother was present sitting nearby. CSW introduced self and requested to speak with MOB alone. MOB provided verbal consent for CSW to complete consult with maternal grandmother present. CSW explained reason for consult. MOB presented as calm, was agreeable to consult and remained engaged throughout encounter.    CSW confirmed demographic information on file. CSW inquired how MOB is feeling emotionally since infant's arrival. MOB shared she is feeling "good." CSW inquired about MOB's mental health history. MOB shares she was diagnosed with depression and anxiety as a young adult. CSW inquired about mental health symptoms during pregnancy. MOB shared that she endorsed symptoms of depression and anxiety during the first trimester of her pregnancy but felt fine after.  MOB states she has not attended therapy in the past or taken psychotropic medications. MOB declined outpatient mental health resources at this time. MOB denied a history of postpartum depression after the birth of her older son. MOB identified her mom, aunt, her mom's cousins, and FOB as supports. MOB denied current SI/HI.   CSW provided education regarding the baby blues period vs. perinatal mood disorders, discussed treatment and gave resources for mental health follow up if concerns arise.  CSW recommends self-evaluation during the postpartum time period using the New Mom Checklist from Postpartum Progress and encouraged MOB to contact a medical professional if symptoms are noted at any time.     MOB reports she has all needed items for infant, including a car seat and  bassinet. MOB denied transportation barriers.   CSW informed MOB about hospital drug screen policy due to documented use of marijuana during pregnancy. CSW explained that infant's UDS resulted as negative for illicit substances, infant's CDS would be monitored and a CPS report would be made if warranted. MOB expressed understanding. CSW inquired about substance use during pregnancy. MOB reports she smoked marijuana in the beginning of her pregnancy to help her have an appetite and due to nausea symptoms. MOB states she stopped smoking marijuana May 2024. MOB denied using other illicit substances during pregnancy. MOB denied a history of CPS involvement.    CSW provided review of Sudden Infant Death Syndrome (SIDS) precautions.     CSW identifies no further need for intervention and no barriers to discharge at this time.   CSW Plan/Description:  No Further Intervention Required/No Barriers to Discharge, Sudden Infant Death Syndrome (SIDS) Education, Perinatal Mood and Anxiety Disorder (PMADs) Education, Hospital Drug Screen Policy Information, CSW Will Continue to Monitor Umbilical Cord Tissue Drug Screen Results and Make Report if Reggie Pile, LCSWA May 14, 2023, 6:08 PM

## 2022-12-09 ENCOUNTER — Other Ambulatory Visit (HOSPITAL_COMMUNITY): Payer: Self-pay

## 2022-12-09 MED ORDER — IBUPROFEN 600 MG PO TABS
600.0000 mg | ORAL_TABLET | Freq: Four times a day (QID) | ORAL | 0 refills | Status: DC
  Filled 2022-12-09: qty 30, 8d supply, fill #0

## 2022-12-09 MED ORDER — ACETAMINOPHEN 325 MG PO TABS
650.0000 mg | ORAL_TABLET | ORAL | 0 refills | Status: DC | PRN
  Filled 2022-12-09: qty 100, 9d supply, fill #0

## 2022-12-09 MED ORDER — SENNOSIDES-DOCUSATE SODIUM 8.6-50 MG PO TABS
2.0000 | ORAL_TABLET | ORAL | 0 refills | Status: DC
  Filled 2022-12-09: qty 30, 15d supply, fill #0

## 2022-12-26 ENCOUNTER — Encounter: Payer: Self-pay | Admitting: *Deleted

## 2023-01-02 ENCOUNTER — Telehealth (HOSPITAL_COMMUNITY): Payer: Self-pay

## 2023-01-02 NOTE — Telephone Encounter (Signed)
01/02/2023 1926  Name: Jacqueline Barrera MRN: [id number] DOB: 06/26/1995  Reason for Call:  Transition of Care Hospital Discharge Call  Contact Status: Patient Contact Status: Complete  Language assistant needed: Interpreter Mode: Interpreter Not Needed        Follow-Up Questions: Do You Have Any Concerns About Your Health As You Heal From Delivery?: No Do You Have Any Concerns About Your Infants Health?: No  Edinburgh Postnatal Depression Scale:  In the Past 7 Days:    PHQ2-9 Depression Scale:     Discharge Follow-up: Edinburgh score requires follow up?:  (Patient declines EPDS. She states that she is doing well emotionally. RN asked patient if she would like me to call her back at a later time to complete EPDS and patient declines.) Did patient express any COVID concerns?: No  Post-discharge interventions: Reviewed Newborn Safe Sleep Practices  Signature [name]

## 2023-01-13 ENCOUNTER — Ambulatory Visit: Payer: No Typology Code available for payment source | Admitting: Women's Health

## 2023-01-16 ENCOUNTER — Encounter: Payer: Self-pay | Admitting: Women's Health

## 2023-01-16 ENCOUNTER — Ambulatory Visit: Payer: No Typology Code available for payment source | Admitting: Women's Health

## 2023-01-16 DIAGNOSIS — Z30011 Encounter for initial prescription of contraceptive pills: Secondary | ICD-10-CM

## 2023-01-16 MED ORDER — SLYND 4 MG PO TABS: 1.0000 | ORAL_TABLET | Freq: Every day | ORAL | 3 refills | Status: DC

## 2023-01-16 NOTE — Progress Notes (Signed)
POSTPARTUM VISIT Patient name: Jacqueline Barrera MRN [id number]  Date of birth: [birth year] Chief Complaint:   Postpartum Care  History of Present Illness:   Jacqueline Barrera is a 27 y.o. [id number] African American female being seen today for a postpartum visit. She is 5 weeks postpartum following a spontaneous vaginal delivery at 38.1 gestational weeks. IOL: yes, for h/o IUFD. Anesthesia: epidural.  Laceration: none.  Complications: none. Inpatient contraception: no.   Pregnancy uncomplicated. Tobacco use: former . Substance use disorder: no. Last pap smear: 07/08/22 and results were NILM w/ HRHPV negative. Next pap smear due: 2027 Patient's last menstrual period was 03/23/2022 (approximate).  Postpartum course has been uncomplicated. Bleeding none. Bowel function is normal. Bladder function is normal. Urinary incontinence? no, fecal incontinence? no Patient is not sexually active. Last sexual activity: prior to birth of baby. Desired contraception: pills, does have migraines w/ aura. Discussed Slynd vs micronor, wants Slynd. Patient does want a pregnancy in the future.  Desired family size is 3 children.   Upstream - 01/16/23 1136       Pregnancy Intention Screening   Does the patient want to become pregnant in the next year? No    Does the patient's partner want to become pregnant in the next year? No    Would the patient like to discuss contraceptive options today? Yes      Contraception Wrap Up   Current Method Oral Contraceptive    End Method Oral Contraceptive    Contraception Counseling Provided Yes            The pregnancy intention screening data noted above was reviewed. Potential methods of contraception were discussed. The patient elected to proceed with Oral Contraceptive.  Edinburgh Postpartum Depression Screening: negative  Edinburgh Postnatal Depression Scale - 01/16/23 1135       Edinburgh Postnatal Depression Scale:  In the Past 7 Days   I have been able to laugh and  see the funny side of things. 0    I have looked forward with enjoyment to things. 0    I have blamed myself unnecessarily when things went wrong. 0    I have been anxious or worried for no good reason. 0    I have felt scared or panicky for no good reason. 0    Things have been getting on top of me. 0    I have been so unhappy that I have had difficulty sleeping. 0    I have felt sad or miserable. 0    I have been so unhappy that I have been crying. 0    The thought of harming myself has occurred to me. 0    Edinburgh Postnatal Depression Scale Total 0                06/10/2022    3:08 PM  GAD 7 : Generalized Anxiety Score  Nervous, Anxious, on Edge 3  Control/stop worrying 3  Worry too much - different things 3  Trouble relaxing 1  Restless 1  Easily annoyed or irritable 3  Afraid - awful might happen 0  Total GAD 7 Score 14     Baby's course has been uncomplicated. Baby is feeding by bottle. Infant has a pediatrician/family doctor? Yes.  Childcare strategy if returning to work/school: family.  Pt has material needs met for her and baby: Yes.   Review of Systems:   Pertinent items are noted in HPI Denies Abnormal vaginal discharge w/ itching/odor/irritation, headaches, visual changes, shortness  of breath, chest pain, abdominal pain, severe nausea/vomiting, or problems with urination or bowel movements. Pertinent History Reviewed:  Reviewed past medical,surgical, obstetrical and family history.  Reviewed problem list, medications and allergies. OB History  Gravida Para Term Preterm AB Living  5 3 1 2 2 2   SAB IAB Ectopic Multiple Live Births  0 2   0 2    # Outcome Date GA Lbr Len/2nd Weight Sex Type Anes PTL Lv  5 Term 12/07/22 [redacted]w[redacted]d 00:17 / 00:29 7 lb 0.2 oz (3.18 kg) M Vag-Spont EPI  LIV     Birth Comments: WNL  4 Preterm 11/20/17 [redacted]w[redacted]d 07:10 / 00:21 6 lb 4.4 oz (2.845 kg) M Vag-Spont EPI  LIV  3 Preterm 02/04/17 [redacted]w[redacted]d 08:21 / 00:23 3 lb 2.6 oz (1.435 kg) F  Vag-Spont EPI N FD     Complications: Severe pre-eclampsia, Placental abruption  2 IAB           1 IAB            Physical Assessment:   Vitals:   01/16/23 1131  BP: 108/64  Pulse: 74  Weight: 184 lb 4.8 oz (83.6 kg)  Height: 5\' 4"  (1.626 m)  Body mass index is 31.64 kg/m.       Physical Examination:   General appearance: alert, well appearing, and in no distress  Mental status: alert, oriented to person, place, and time  Skin: warm & dry   Cardiovascular: normal heart rate noted   Respiratory: normal respiratory effort, no distress   Breasts: deferred, no complaints   Abdomen: soft, non-tender   Pelvic: examination not indicated. Thin prep pap obtained: No  Rectal: not examined  Extremities: Edema: none   Chaperone: N/A         No results found for this or any previous visit (from the past 24 hour(s)).  Assessment & Plan:  1) Postpartum exam 2) 5 wks s/p spontaneous vaginal delivery after IOL for h/o IUFD 3) bottle feeding 4) Depression screening 5) Contraception management: rx Slynd to [employer] (migraines w/ aura)  Essential components of care per ACOG recommendations:  1.  Mood and well being:  If positive depression screen, discussed and plan developed.  If using tobacco we discussed reduction/cessation and risk of relapse If current substance abuse, we discussed and referral to local resources was offered.   2. Infant care and feeding:  If breastfeeding, discussed returning to work, pumping, breastfeeding-associated pain, guidance regarding return to fertility while lactating if not using another method. If needed, patient was provided with a letter to be allowed to pump q 2-3hrs to support lactation in a private location with access to a refrigerator to store breastmilk.   Recommended that all caregivers be immunized for flu, pertussis and other preventable communicable diseases If pt does not have material needs met for her/baby, referred to local resources for  help obtaining these.  3. Sexuality, contraception and birth spacing Provided guidance regarding sexuality, management of dyspareunia, and resumption of intercourse Discussed avoiding interpregnancy interval <37mths and recommended birth spacing of 18 months  4. Sleep and fatigue Discussed coping options for fatigue and sleep disruption Encouraged family/partner/community support of 4 hrs of uninterrupted sleep to help with mood and fatigue  5. Physical recovery  If pt had a C/S, assessed incisional pain and providing guidance on normal vs prolonged recovery If pt had a laceration, perineal healing and pain reviewed.  If urinary or fecal incontinence, discussed management and referred to PT or uro/gyn  if indicated  Patient is safe to resume physical activity. Discussed attainment of healthy weight.  6.  Chronic disease management Discussed pregnancy complications if any, and their implications for future childbearing and long-term maternal health. Review recommendations for prevention of recurrent pregnancy complications, such as 17 hydroxyprogesterone caproate to reduce risk for recurrent PTB not applicable, or aspirin to reduce risk of preeclampsia not applicable. Pt had GDM: no. If yes, 2hr GTT scheduled: not applicable. Reviewed medications and non-pregnant dosing including consideration of whether pt is breastfeeding using a reliable resource such as LactMed: not applicable Referred for f/u w/ PCP or subspecialist providers as indicated: not applicable  7. Health maintenance Mammogram at 29yo or earlier if indicated Pap smears as indicated  Meds:  Meds ordered this encounter  Medications   Drospirenone (SLYND) 4 MG TABS    Sig: Take 1 tablet (4 mg total) by mouth daily.    Dispense:  90 tablet    Refill:  3    Follow-up: No follow-ups on file.   No orders of the defined types were placed in this encounter.   Cheral Marker CNM, [facility] 01/16/2023 11:57 AM

## 2023-02-26 ENCOUNTER — Other Ambulatory Visit: Payer: No Typology Code available for payment source

## 2023-02-27 ENCOUNTER — Other Ambulatory Visit (INDEPENDENT_AMBULATORY_CARE_PROVIDER_SITE_OTHER): Payer: No Typology Code available for payment source

## 2023-02-27 ENCOUNTER — Other Ambulatory Visit (HOSPITAL_COMMUNITY)
Admission: RE | Admit: 2023-02-27 | Discharge: 2023-02-27 | Disposition: A | Discharge status: Home / Self Care | Payer: No Typology Code available for payment source | Source: Ambulatory Visit | Attending: Obstetrics & Gynecology | Admitting: Obstetrics & Gynecology

## 2023-02-27 DIAGNOSIS — Z113 Encounter for screening for infections with a predominantly sexual mode of transmission: Secondary | ICD-10-CM | POA: Insufficient documentation

## 2023-02-27 DIAGNOSIS — N898 Other specified noninflammatory disorders of vagina: Secondary | ICD-10-CM | POA: Insufficient documentation

## 2023-02-27 NOTE — Progress Notes (Signed)
   NURSE VISIT- VAGINITIS/STD/POC  SUBJECTIVE:  Jacqueline Barrera is a 27 y.o. [id number] GYN patientfemale here for a vaginal swab for STD and vaginitis screening.  She reports the following symptoms: discharge described as clear for 3 days. Patient desires to be tested for everything "just to be safe." Denies abnormal vaginal bleeding, significant pelvic pain, fever, or UTI symptoms.  OBJECTIVE:  There were no vitals taken for this visit.  Appears well, in no apparent distress  ASSESSMENT: Vaginal swab for STD screen and vaginitis.  PLAN: Self-collected vaginal probe for Gonorrhea, Chlamydia, Trichomonas, Bacterial Vaginosis, Yeast sent to lab Treatment: to be determined once results are received Follow-up as needed if symptoms persist/worsen, or new symptoms develop  Caralyn Guile  02/27/2023 8:44 AM

## 2023-02-28 LAB — CERVICOVAGINAL ANCILLARY ONLY
Bacterial Vaginitis (gardnerella): NEGATIVE
Candida Glabrata: NEGATIVE
Candida Vaginitis: NEGATIVE
Chlamydia: NEGATIVE
Comment: NEGATIVE
Comment: NEGATIVE
Comment: NEGATIVE
Comment: NEGATIVE
Comment: NEGATIVE
Comment: NORMAL
Neisseria Gonorrhea: NEGATIVE
Trichomonas: NEGATIVE

## 2023-03-25 ENCOUNTER — Ambulatory Visit: Payer: No Typology Code available for payment source

## 2023-03-25 ENCOUNTER — Other Ambulatory Visit (HOSPITAL_COMMUNITY)
Admission: RE | Admit: 2023-03-25 | Discharge: 2023-03-25 | Disposition: A | Discharge status: Home / Self Care | Payer: No Typology Code available for payment source | Source: Ambulatory Visit | Attending: Obstetrics & Gynecology | Admitting: Obstetrics & Gynecology

## 2023-03-25 DIAGNOSIS — N898 Other specified noninflammatory disorders of vagina: Secondary | ICD-10-CM

## 2023-03-25 NOTE — Progress Notes (Signed)
   NURSE VISIT- VAGINITIS/STD/POC  SUBJECTIVE:  Jacqueline Barrera is a 27 y.o. G2I9485 GYN patientfemale here for a vaginal swab for vaginitis screening.  She reports the following symptoms: vaginal itching and discharge described as white for 3 days. Denies abnormal vaginal bleeding, significant pelvic pain, fever, or UTI symptoms.  OBJECTIVE:  There were no vitals taken for this visit.  Appears well, in no apparent distress  ASSESSMENT: Vaginal swab for vaginitis screening  PLAN: Self-collected vaginal probe for Gonorrhea, Chlamydia, Trichomonas, Bacterial Vaginosis, Yeast sent to lab Treatment: to be determined once results are received Follow-up as needed if symptoms persist/worsen, or new symptoms develop  Caralyn Guile  03/25/2023 2:41 PM

## 2023-03-27 ENCOUNTER — Other Ambulatory Visit: Payer: Self-pay | Admitting: Adult Health

## 2023-03-27 LAB — CERVICOVAGINAL ANCILLARY ONLY
Bacterial Vaginitis (gardnerella): NEGATIVE
Candida Glabrata: NEGATIVE
Candida Vaginitis: POSITIVE — AB
Chlamydia: NEGATIVE
Comment: NEGATIVE
Comment: NEGATIVE
Comment: NEGATIVE
Comment: NEGATIVE
Comment: NEGATIVE
Comment: NORMAL
Neisseria Gonorrhea: NEGATIVE
Trichomonas: NEGATIVE

## 2023-03-27 MED ORDER — FLUCONAZOLE 150 MG PO TABS
ORAL_TABLET | ORAL | 1 refills | Status: DC
Start: 2023-03-27 — End: 2024-01-07

## 2023-03-27 NOTE — Progress Notes (Signed)
+  yeast on vaginal swab will rx diflucan  

## 2023-06-03 ENCOUNTER — Other Ambulatory Visit (INDEPENDENT_AMBULATORY_CARE_PROVIDER_SITE_OTHER): Payer: No Typology Code available for payment source

## 2023-06-03 ENCOUNTER — Other Ambulatory Visit (HOSPITAL_COMMUNITY)
Admission: RE | Admit: 2023-06-03 | Discharge: 2023-06-03 | Disposition: A | Discharge status: Home / Self Care | Payer: No Typology Code available for payment source | Source: Ambulatory Visit | Attending: Obstetrics & Gynecology | Admitting: Obstetrics & Gynecology

## 2023-06-03 DIAGNOSIS — Z113 Encounter for screening for infections with a predominantly sexual mode of transmission: Secondary | ICD-10-CM | POA: Insufficient documentation

## 2023-06-03 NOTE — Progress Notes (Signed)
   NURSE VISIT- STD  SUBJECTIVE:  Jacqueline Barrera is a 28 y.o. [id number] GYN patientfemale here for a vaginal swab for STD screen.  She reports the following symptoms: none. Was told she came in contact with someone who had trichomoniasis but wants everything checked.   Denies abnormal vaginal bleeding, significant pelvic pain, fever, or UTI symptoms.  OBJECTIVE:  There were no vitals taken for this visit.  Appears well, in no apparent distress  ASSESSMENT: Vaginal swab for STD screen  PLAN: Self-collected vaginal probe for Gonorrhea, Chlamydia, Trichomonas, Bacterial Vaginosis, Yeast sent to lab Treatment: to be determined once results are received Follow-up as needed if symptoms persist/worsen, or new symptoms develop  Zuly Belkin  06/03/2023 11:31 AM

## 2023-06-04 LAB — CERVICOVAGINAL ANCILLARY ONLY
Bacterial Vaginitis (gardnerella): NEGATIVE
Candida Glabrata: NEGATIVE
Candida Vaginitis: NEGATIVE
Chlamydia: NEGATIVE
Comment: NEGATIVE
Comment: NEGATIVE
Comment: NEGATIVE
Comment: NEGATIVE
Comment: NEGATIVE
Comment: NORMAL
Neisseria Gonorrhea: NEGATIVE
Trichomonas: NEGATIVE

## 2023-08-05 ENCOUNTER — Ambulatory Visit

## 2023-08-11 ENCOUNTER — Other Ambulatory Visit (HOSPITAL_COMMUNITY)
Admission: RE | Admit: 2023-08-11 | Discharge: 2023-08-11 | Disposition: A | Discharge status: Home / Self Care | Source: Ambulatory Visit | Attending: Obstetrics & Gynecology | Admitting: Obstetrics & Gynecology

## 2023-08-11 ENCOUNTER — Ambulatory Visit: Admitting: *Deleted

## 2023-08-11 DIAGNOSIS — Z3202 Encounter for pregnancy test, result negative: Secondary | ICD-10-CM

## 2023-08-11 DIAGNOSIS — N898 Other specified noninflammatory disorders of vagina: Secondary | ICD-10-CM | POA: Insufficient documentation

## 2023-08-11 DIAGNOSIS — N926 Irregular menstruation, unspecified: Secondary | ICD-10-CM

## 2023-08-11 LAB — POCT URINE PREGNANCY: Preg Test, Ur: NEGATIVE

## 2023-08-11 NOTE — Progress Notes (Signed)
   NURSE VISIT- VAGINITIS/STD  SUBJECTIVE:  Jacqueline Barrera is a 28 y.o. [id number] GYN patientfemale here for a vaginal swab for vaginitis screening.  She reports the following symptoms: discharge described as clear and slimy for 2-3 days. Has also not started her period since starting birth control pills as she was late taking them.  Denies abnormal vaginal bleeding, significant pelvic pain, fever, or UTI symptoms.  OBJECTIVE:  LMP 07/06/2023   Appears well, in no apparent distress  ASSESSMENT: Vaginal swab for vaginitis screening UPT negative  PLAN: Self-collected vaginal probe for Gonorrhea, Chlamydia, Trichomonas, Bacterial Vaginosis, Yeast sent to lab Treatment: to be determined once results are received Follow-up as needed if symptoms persist/worsen, or new symptoms develop  [name]  08/11/2023 4:14 PM

## 2023-08-12 LAB — CERVICOVAGINAL ANCILLARY ONLY
Bacterial Vaginitis (gardnerella): NEGATIVE
Candida Glabrata: NEGATIVE
Candida Vaginitis: NEGATIVE
Chlamydia: NEGATIVE
Comment: NEGATIVE
Comment: NEGATIVE
Comment: NEGATIVE
Comment: NEGATIVE
Comment: NEGATIVE
Comment: NORMAL
Neisseria Gonorrhea: NEGATIVE
Trichomonas: NEGATIVE

## 2023-09-30 ENCOUNTER — Ambulatory Visit

## 2023-10-10 ENCOUNTER — Ambulatory Visit

## 2023-10-16 ENCOUNTER — Ambulatory Visit

## 2023-10-21 ENCOUNTER — Other Ambulatory Visit (HOSPITAL_COMMUNITY)
Admission: RE | Admit: 2023-10-21 | Discharge: 2023-10-21 | Disposition: A | Discharge status: Home / Self Care | Source: Ambulatory Visit | Attending: Obstetrics & Gynecology | Admitting: Obstetrics & Gynecology

## 2023-10-21 ENCOUNTER — Ambulatory Visit

## 2023-10-21 DIAGNOSIS — Z113 Encounter for screening for infections with a predominantly sexual mode of transmission: Secondary | ICD-10-CM

## 2023-10-21 NOTE — Progress Notes (Signed)
   NURSE VISIT- STD  SUBJECTIVE:  Jacqueline Barrera is a 28 y.o. [id number] GYN patientfemale here for a vaginal swab for vaginitis screening, STD screen.  She reports the following symptoms: none for 0 days. Denies abnormal vaginal bleeding, significant pelvic pain, fever, or UTI symptoms.  OBJECTIVE:  There were no vitals taken for this visit.  Appears well, in no apparent distress  ASSESSMENT: Vaginal swab for STD screen  PLAN: Self-collected vaginal probe for Gonorrhea, Chlamydia, Trichomonas, Bacterial Vaginosis, Yeast sent to lab Treatment: to be determined once results are received Follow-up as needed if symptoms persist/worsen, or new symptoms develop  Alyssa Jumper  10/21/2023 8:59 AM

## 2023-10-22 ENCOUNTER — Ambulatory Visit: Payer: Self-pay | Admitting: Adult Health

## 2023-10-22 LAB — CERVICOVAGINAL ANCILLARY ONLY
Bacterial Vaginitis (gardnerella): NEGATIVE
Candida Glabrata: NEGATIVE
Candida Vaginitis: NEGATIVE
Chlamydia: NEGATIVE
Comment: NEGATIVE
Comment: NEGATIVE
Comment: NEGATIVE
Comment: NEGATIVE
Comment: NEGATIVE
Comment: NORMAL
Neisseria Gonorrhea: NEGATIVE
Trichomonas: NEGATIVE

## 2023-12-09 ENCOUNTER — Ambulatory Visit

## 2023-12-15 ENCOUNTER — Ambulatory Visit

## 2023-12-23 ENCOUNTER — Other Ambulatory Visit (HOSPITAL_COMMUNITY)
Admission: RE | Admit: 2023-12-23 | Discharge: 2023-12-23 | Disposition: A | Discharge status: Home / Self Care | Source: Ambulatory Visit | Attending: Obstetrics & Gynecology | Admitting: Obstetrics & Gynecology

## 2023-12-23 ENCOUNTER — Ambulatory Visit (INDEPENDENT_AMBULATORY_CARE_PROVIDER_SITE_OTHER): Admitting: *Deleted

## 2023-12-23 DIAGNOSIS — Z113 Encounter for screening for infections with a predominantly sexual mode of transmission: Secondary | ICD-10-CM

## 2023-12-23 NOTE — Progress Notes (Signed)
   NURSE VISIT- VAGINITIS/STD/POC  SUBJECTIVE:  Jacqueline Barrera is a 28 y.o. [id number] GYN patientfemale here for a vaginal swab for STD screen.  She reports the following symptoms: none for 0 days. Denies abnormal vaginal bleeding, significant pelvic pain, fever, or UTI symptoms.  OBJECTIVE:  There were no vitals taken for this visit.  Appears well, in no apparent distress  ASSESSMENT: Vaginal swab for STD screen  PLAN: Self-collected vaginal probe for Gonorrhea, Chlamydia, Trichomonas, Bacterial Vaginosis, Yeast sent to lab Treatment: to be determined once results are received Follow-up as needed if symptoms persist/worsen, or new symptoms develop  Alan LITTIE Fischer  12/23/2023 9:14 AM

## 2023-12-24 ENCOUNTER — Ambulatory Visit: Payer: Self-pay | Admitting: Adult Health

## 2023-12-24 DIAGNOSIS — Z113 Encounter for screening for infections with a predominantly sexual mode of transmission: Secondary | ICD-10-CM

## 2023-12-24 LAB — CERVICOVAGINAL ANCILLARY ONLY
Bacterial Vaginitis (gardnerella): NEGATIVE
Candida Glabrata: NEGATIVE
Candida Vaginitis: NEGATIVE
Chlamydia: NEGATIVE
Comment: NEGATIVE
Comment: NEGATIVE
Comment: NEGATIVE
Comment: NEGATIVE
Comment: NEGATIVE
Comment: NORMAL
Neisseria Gonorrhea: NEGATIVE
Trichomonas: NEGATIVE

## 2023-12-26 ENCOUNTER — Other Ambulatory Visit: Payer: Self-pay | Admitting: Women's Health

## 2023-12-30 ENCOUNTER — Ambulatory Visit: Payer: Self-pay | Admitting: Nurse Practitioner

## 2023-12-30 ENCOUNTER — Encounter: Payer: Self-pay | Admitting: Nurse Practitioner

## 2023-12-30 VITALS — BP 124/82 | Ht 65.0 in | Wt 183.0 lb

## 2023-12-30 DIAGNOSIS — M67431 Ganglion, right wrist: Secondary | ICD-10-CM | POA: Diagnosis not present

## 2023-12-30 DIAGNOSIS — G43719 Chronic migraine without aura, intractable, without status migrainosus: Secondary | ICD-10-CM | POA: Diagnosis not present

## 2023-12-30 DIAGNOSIS — R350 Frequency of micturition: Secondary | ICD-10-CM | POA: Diagnosis not present

## 2023-12-30 MED ORDER — PROPRANOLOL HCL ER 60 MG PO CP24: 60.0000 mg | ORAL_CAPSULE | Freq: Every day | ORAL | 0 refills | Status: AC

## 2023-12-30 MED ORDER — UBRELVY 50 MG PO TABS: 50.0000 mg | ORAL_TABLET | ORAL | 0 refills | Status: DC

## 2023-12-30 NOTE — Addendum Note (Signed)
 Addended by: GLENNON SAND on: 12/30/2023 02:24 PM   Modules accepted: Orders

## 2023-12-30 NOTE — Patient Instructions (Addendum)
 1) igraine - Ubrelvy  for acute HA and propranolol  daily for prevention Ganglionic cyst - Gen surg consult Follow up appt in 3 weeks to assess migraines

## 2023-12-30 NOTE — Progress Notes (Signed)
 New Patient Office Visit  Subjective    Patient ID: Jacqueline Barrera, female    DOB: Sep 20, 1995  Age: 28 y.o. MRN: [id number]  CC:  Chief Complaint  Patient presents with   Establish Care    Pt here to Establish care also concerned for lumps on right wrist and is known to have migraines in the past     HPI [name] presents to establish care Patient here today as a new patient and reports migraine headaches with dizziness and must lie down to sleep the migraine away.  Patient currently on birth control only.  Patient also reports 1 month of right wrist ganglion cyst, painful.  Last pap unsure of exact date but due Jul 08, 2025.  Outpatient Encounter Medications as of 12/30/2023  Medication Sig   Drospirenone  (SLYND ) 4 MG TABS TAKE ONE (1) TABLET (4 MG TOTAL) BY MOUTH DAILY.   fluconazole  (DIFLUCAN ) 150 MG tablet Take 1 now and 1 in 3 days if needed (Patient not taking: Reported on 08/11/2023)   No facility-administered encounter medications on file as of 12/30/2023.    Past Medical History:  Diagnosis Date   Anxiety    History of pre-eclampsia in prior pregnancy, currently pregnant    Scoliosis    Trichimoniasis 05/04/2021   05/04/21 rx flagyl     Past Surgical History:  Procedure Laterality Date   ADENOIDECTOMY     TONSILLECTOMY AND ADENOIDECTOMY     WISDOM TOOTH EXTRACTION      Family History  Problem Relation Age of Onset   Healthy Mother    Hyperlipidemia Maternal Aunt    Hypertension Maternal Grandmother     Social History   Socioeconomic History   Marital status: Single    Spouse name: Not on file   Number of children: 1   Years of education: Not on file   Highest education level: Associate degree: occupational, Scientist, product/process development, or vocational program  Occupational History   Not on file  Tobacco Use   Smoking status: Every Day    Types: Cigars   Smokeless tobacco: Never   Tobacco comments:    smokes 1 cigar daily  Vaping Use   Vaping status: Never Used   Substance and Sexual Activity   Alcohol use: Not Currently    Alcohol/week: 35.0 standard drinks of alcohol    Types: 35 Shots of liquor per week    Comment: on Saturday's   Drug use: Yes    Frequency: 7.0 times per week    Types: Marijuana    Comment: Stopped May 2024   Sexual activity: Not Currently    Partners: Male    Birth control/protection: None  Other Topics Concern   Not on file  Social History Narrative   Not on file   Social Drivers of Health   Financial Resource Strain: Medium Risk (12/29/2023)   Overall Financial Resource Strain (CARDIA)    Difficulty of Paying Living Expenses: Somewhat hard  Food Insecurity: Food Insecurity Present (12/29/2023)   Hunger Vital Sign    Worried About Running Out of Food in the Last Year: Sometimes true    Ran Out of Food in the Last Year: Never true  Transportation Needs: No Transportation Needs (12/29/2023)   PRAPARE - Administrator, Civil Service (Medical): No    Lack of Transportation (Non-Medical): No  Physical Activity: Insufficiently Active (06/10/2022)   Exercise Vital Sign    Days of Exercise per Week: 1 day    Minutes of Exercise  per Session: 10 min  Stress: Stress Concern Present (12/29/2023)   [employer] of Occupational Health - Occupational Stress Questionnaire    Feeling of Stress: To some extent  Social Connections: Moderately Isolated (12/29/2023)   Social Connection and Isolation Panel    Frequency of Communication with Friends and Family: More than three times a week    Frequency of Social Gatherings with Friends and Family: Once a week    Attends Religious Services: More than 4 times per year    Active Member of [employer] or Organizations: No    Attends Engineer, structural: Not on file    Marital Status: Never married  Intimate Partner Violence: Not At Risk (12/07/2022)   Humiliation, Afraid, Rape, and Kick questionnaire    Fear of Current or Ex-Partner: No    Emotionally Abused: No     Physically Abused: No    Sexually Abused: No    ROS      Objective    BP 124/82   Ht 5' 5 (1.651 m)   Wt 183 lb 0.6 oz (83 kg)   BMI 30.46 kg/m   Physical Exam Vitals and nursing note reviewed.  Constitutional:      Appearance: Normal appearance.  HENT:     Head: Normocephalic.     Nose: Nose normal.     Mouth/Throat:     Mouth: Mucous membranes are moist.  Cardiovascular:     Rate and Rhythm: Normal rate and regular rhythm.     Pulses: Normal pulses.     Heart sounds: Normal heart sounds.  Pulmonary:     Effort: Pulmonary effort is normal.     Breath sounds: Normal breath sounds.  Abdominal:     General: Abdomen is flat.     Palpations: Abdomen is soft.  Musculoskeletal:        General: Normal range of motion.     Cervical back: Normal range of motion and neck supple.  Skin:    Findings: Lesion present.  Neurological:     Mental Status: She is alert and oriented to person, place, and time.  Psychiatric:        Mood and Affect: Mood normal.        Behavior: Behavior normal.         Assessment & Plan:  Migraine - Ubrelvy  for acute HA and propranolol  daily for prevention Ganglionic cyst - Gen surg consult Frequent urination - CMP Follow up appt in 1 month to assess migraines   Problem List Items Addressed This Visit   None   No follow-ups on file.   Neale Carpen, NP

## 2023-12-31 ENCOUNTER — Ambulatory Visit: Payer: Self-pay

## 2023-12-31 ENCOUNTER — Other Ambulatory Visit: Payer: Self-pay

## 2023-12-31 LAB — CMP14+EGFR
ALT: 14 IU/L (ref 0–32)
AST: 14 IU/L (ref 0–40)
Albumin: 4.6 g/dL (ref 4.0–5.0)
Alkaline Phosphatase: 70 IU/L (ref 44–121)
BUN/Creatinine Ratio: 9 (ref 9–23)
BUN: 7 mg/dL (ref 6–20)
Bilirubin Total: 0.4 mg/dL (ref 0.0–1.2)
CO2: 23 mmol/L (ref 20–29)
Calcium: 9.3 mg/dL (ref 8.7–10.2)
Chloride: 105 mmol/L (ref 96–106)
Creatinine, Ser: 0.82 mg/dL (ref 0.57–1.00)
Globulin, Total: 2.3 g/dL (ref 1.5–4.5)
Glucose: 87 mg/dL (ref 70–99)
Potassium: 3.2 mmol/L — ABNORMAL LOW (ref 3.5–5.2)
Sodium: 142 mmol/L (ref 134–144)
Total Protein: 6.9 g/dL (ref 6.0–8.5)
eGFR: 99 mL/min/1.73 (ref >59)

## 2023-12-31 MED ORDER — POTASSIUM CHLORIDE CRYS ER 10 MEQ PO TBCR: 10.0000 meq | EXTENDED_RELEASE_TABLET | ORAL | 0 refills | Status: DC

## 2024-01-02 ENCOUNTER — Other Ambulatory Visit (HOSPITAL_COMMUNITY): Payer: Self-pay

## 2024-01-02 ENCOUNTER — Telehealth: Payer: Self-pay | Admitting: Pharmacy Technician

## 2024-01-02 NOTE — Telephone Encounter (Signed)
 Pharmacy Patient Advocate Encounter   Received notification from Onbase that prior authorization for Ubrelvy  50MG  tablets is required/requested.   Insurance verification completed.   The patient is insured through [facility] .   Patient has to try/fail 2 preferred triptans before she can be approved for Ubrelvy . Please advise.

## 2024-01-06 ENCOUNTER — Ambulatory Visit (INDEPENDENT_AMBULATORY_CARE_PROVIDER_SITE_OTHER): Admitting: *Deleted

## 2024-01-06 ENCOUNTER — Other Ambulatory Visit (HOSPITAL_COMMUNITY)
Admission: RE | Admit: 2024-01-06 | Discharge: 2024-01-06 | Disposition: A | Discharge status: Home / Self Care | Source: Ambulatory Visit | Attending: Obstetrics & Gynecology | Admitting: Obstetrics & Gynecology

## 2024-01-06 DIAGNOSIS — N898 Other specified noninflammatory disorders of vagina: Secondary | ICD-10-CM

## 2024-01-06 NOTE — Progress Notes (Signed)
   NURSE VISIT- VAGINITIS  SUBJECTIVE:  Jacqueline Barrera is a 28 y.o. [id number] GYN patientfemale here for a vaginal swab for vaginitis screening.  She reports the following symptoms: discharge described as curd-like and vulvar itching for 3 days. Started course of antibiotics last week and symptoms developed soon after.  Denies abnormal vaginal bleeding, significant pelvic pain, fever, or UTI symptoms.  OBJECTIVE:  There were no vitals taken for this visit.  Appears well, in no apparent distress  ASSESSMENT: Vaginal swab for vaginitis screening  PLAN: Self-collected vaginal probe for Bacterial Vaginosis, Yeast sent to lab Treatment: to be determined once results are received Follow-up as needed if symptoms persist/worsen, or new symptoms develop  Rutherford Rover  01/06/2024 12:32 PM

## 2024-01-07 ENCOUNTER — Ambulatory Visit: Payer: Self-pay | Admitting: Adult Health

## 2024-01-07 LAB — CERVICOVAGINAL ANCILLARY ONLY
Bacterial Vaginitis (gardnerella): POSITIVE — AB
Candida Glabrata: NEGATIVE
Candida Vaginitis: POSITIVE — AB
Comment: NEGATIVE
Comment: NEGATIVE
Comment: NEGATIVE

## 2024-01-07 MED ORDER — METRONIDAZOLE 500 MG PO TABS: 500.0000 mg | ORAL_TABLET | Freq: Two times a day (BID) | ORAL | 0 refills | Status: DC

## 2024-01-07 MED ORDER — FLUCONAZOLE 150 MG PO TABS: ORAL_TABLET | ORAL | 1 refills | Status: DC

## 2024-01-20 ENCOUNTER — Ambulatory Visit: Admitting: Nurse Practitioner

## 2024-01-28 ENCOUNTER — Encounter: Payer: Self-pay | Admitting: Nurse Practitioner

## 2024-01-30 ENCOUNTER — Ambulatory Visit (INDEPENDENT_AMBULATORY_CARE_PROVIDER_SITE_OTHER): Admitting: *Deleted

## 2024-01-30 ENCOUNTER — Other Ambulatory Visit (HOSPITAL_COMMUNITY)
Admission: RE | Admit: 2024-01-30 | Discharge: 2024-01-30 | Disposition: A | Discharge status: Home / Self Care | Source: Ambulatory Visit | Attending: Obstetrics & Gynecology | Admitting: Obstetrics & Gynecology

## 2024-01-30 DIAGNOSIS — N898 Other specified noninflammatory disorders of vagina: Secondary | ICD-10-CM

## 2024-01-30 NOTE — Progress Notes (Signed)
   NURSE VISIT- VAGINITIS/STD/POC  SUBJECTIVE:  Jacqueline Barrera is a 28 y.o. [id number] GYN patientfemale here for a vaginal swab for vaginitis screening.  She reports the following symptoms: discharge described as curd-like for several  days. Denies abnormal vaginal bleeding, significant pelvic pain, fever, or UTI symptoms.  OBJECTIVE:  There were no vitals taken for this visit.  Appears well, in no apparent distress  ASSESSMENT: Vaginal swab for vaginitis screening  PLAN: Self-collected vaginal probe for Bacterial Vaginosis, Yeast sent to lab Treatment: to be determined once results are received Follow-up as needed if symptoms persist/worsen, or new symptoms develop  Alan LITTIE Fischer  01/30/2024 10:59 AM

## 2024-02-02 ENCOUNTER — Ambulatory Visit: Payer: Self-pay | Admitting: Adult Health

## 2024-02-02 LAB — CERVICOVAGINAL ANCILLARY ONLY
Bacterial Vaginitis (gardnerella): POSITIVE — AB
Candida Glabrata: NEGATIVE
Candida Vaginitis: POSITIVE — AB
Comment: NEGATIVE
Comment: NEGATIVE
Comment: NEGATIVE

## 2024-02-02 MED ORDER — METRONIDAZOLE 0.75 % VA GEL: 1.0000 | Freq: Every day | VAGINAL | 0 refills | Status: DC

## 2024-02-02 MED ORDER — FLUCONAZOLE 150 MG PO TABS: ORAL_TABLET | ORAL | 1 refills | Status: DC

## 2024-02-14 ENCOUNTER — Telehealth: Admitting: Family Medicine

## 2024-02-14 ENCOUNTER — Telehealth: Admitting: Family

## 2024-02-14 DIAGNOSIS — N898 Other specified noninflammatory disorders of vagina: Secondary | ICD-10-CM

## 2024-02-14 NOTE — Progress Notes (Signed)
   Thank you for the details you included in the comment boxes. Those details are very helpful in determining the best course of treatment for you and help us  to provide the best care.Because of your current symptoms, we recommend that you schedule a Virtual Urgent Care video visit in order for the provider to better assess what is going on.  The provider will be able to give you a more accurate diagnosis and treatment plan if we can more freely discuss your symptoms and with the addition of a virtual examination.   If you change your visit to a video visit, we will bill your insurance (similar to an office visit) and you will not be charged for this e-Visit. You will be able to stay at home and speak with the first available [facility] advanced practice provider. The link to do a video visit is in the drop down Menu tab of your Welcome screen in MyChart.    I have spent 5 minutes in review of e-visit questionnaire, review and updating patient chart, medical decision making and response to patient.   Roosvelt Mater, PA-C

## 2024-02-14 NOTE — Progress Notes (Signed)
 Virtual Visit Consent   Jacqueline Barrera, you are scheduled for a virtual visit with a [facility] provider today. Just as with appointments in the office, your consent must be obtained to participate. Your consent will be active for this visit and any virtual visit you may have with one of our providers in the next 365 days. If you have a MyChart account, a copy of this consent can be sent to you electronically.  As this is a virtual visit, video technology does not allow for your provider to perform a traditional examination. This may limit your provider's ability to fully assess your condition. If your provider identifies any concerns that need to be evaluated in person or the need to arrange testing (such as labs, EKG, etc.), we will make arrangements to do so. Although advances in technology are sophisticated, we cannot ensure that it will always work on either your end or our end. If the connection with a video visit is poor, the visit may have to be switched to a telephone visit. With either a video or telephone visit, we are not always able to ensure that we have a secure connection.  By engaging in this virtual visit, you consent to the provision of healthcare and authorize for your insurance to be billed (if applicable) for the services provided during this visit. Depending on your insurance coverage, you may receive a charge related to this service.  I need to obtain your verbal consent now. Are you willing to proceed with your visit today? Kenslei Asch has provided verbal consent on 02/14/2024 for a virtual visit (video or telephone). Bari Learn, FNP  Date: 02/14/2024 11:39 AM   Virtual Visit via Video Note   I, Bari Learn, connected with  Lovenia Debruler  ([id number], November 20, 1995) on 02/14/24 at 11:45 AM EDT by a video-enabled telemedicine application and verified that I am speaking with the correct person using two identifiers.  Location: Patient: Virtual Visit Location Patient:  Home Provider: Virtual Visit Location Provider: Home Office   I discussed the limitations of evaluation and management by telemedicine and the availability of in person appointments. The patient expressed understanding and agreed to proceed.    History of Present Illness: Jacqueline Barrera is a 28 y.o. who identifies as a female who was assigned female at birth, and is being seen today for continued vaginal symptoms. She saw her GYN 01/30/24 and was positive for BV and yeast. She completed Flagyl  and diflucan  150 mg X 2.   She reports red, clumpy discharge.   HPI: HPI  Problems:  Patient Active Problem List   Diagnosis Date Noted   Intractable chronic migraine without aura and without status migrainosus 12/30/2023   Ganglion cyst of dorsum of right wrist 12/30/2023   Frequent urination 12/30/2023   Nausea 04/06/2020   History of preterm delivery 12/25/2017   History of IUFD 06/10/2017   H/O preeclampsia 06/10/2017   Marijuana use 08/28/2016    Allergies:  Allergies  Allergen Reactions   Food Nausea And Vomiting     [place] - nausea/vomiting   Medications:  Current Outpatient Medications:    potassium chloride  (KLOR-CON  M) 10 MEQ tablet, Take 1 tablet (10 mEq total) by mouth every other day., Disp: 15 tablet, Rfl: 0   Drospirenone  (SLYND ) 4 MG TABS, TAKE ONE (1) TABLET (4 MG TOTAL) BY MOUTH DAILY., Disp: 84 tablet, Rfl: 0   fluconazole  (DIFLUCAN ) 150 MG tablet, Take 1 now and 1 in 3 days, Disp: 2 tablet, Rfl: 1  metroNIDAZOLE  (FLAGYL ) 500 MG tablet, Take 1 tablet (500 mg total) by mouth 2 (two) times daily., Disp: 14 tablet, Rfl: 0   metroNIDAZOLE  (METROGEL ) 0.75 % vaginal gel, Place 1 Applicatorful vaginally at bedtime. X 7 nights, Disp: 70 g, Rfl: 0   propranolol  ER (INDERAL  LA) 60 MG 24 hr capsule, Take 1 capsule (60 mg total) by mouth daily., Disp: 30 capsule, Rfl: 0   Ubrogepant  (UBRELVY ) 50 MG TABS, Take 1 tablet (50 mg total) by mouth as directed., Disp: 30 tablet, Rfl:  0  Observations/Objective: Patient is well-developed, well-nourished in no acute distress.  Resting comfortably  at home.  Head is normocephalic, atraumatic.  No labored breathing.  Speech is clear and coherent with logical content.  Patient is alert and oriented at baseline.  Reviewed MyChart photo to GYN  Assessment and Plan: 1. Bloody vaginal discharge (Primary)  I recommend to be seen in person for further testing.  Looks like menstrual cycle, but patient states she just completed menstrual cycle 4 days ago.  She may still have yeast?  Recommend probiotic  Avoid douche  Keep follow up with GYN  Follow Up Instructions: I discussed the assessment and treatment plan with the patient. The patient was provided an opportunity to ask questions and all were answered. The patient agreed with the plan and demonstrated an understanding of the instructions.  A copy of instructions were sent to the patient via MyChart unless otherwise noted below.     The patient was advised to call back or seek an in-person evaluation if the symptoms worsen or if the condition fails to improve as anticipated.    Bari Learn, FNP   .[email or web address]

## 2024-02-27 ENCOUNTER — Other Ambulatory Visit (HOSPITAL_COMMUNITY)
Admission: RE | Admit: 2024-02-27 | Discharge: 2024-02-27 | Disposition: A | Discharge status: Home / Self Care | Source: Ambulatory Visit | Attending: Obstetrics & Gynecology | Admitting: Obstetrics & Gynecology

## 2024-02-27 ENCOUNTER — Ambulatory Visit: Admitting: *Deleted

## 2024-02-27 DIAGNOSIS — Z113 Encounter for screening for infections with a predominantly sexual mode of transmission: Secondary | ICD-10-CM

## 2024-02-27 DIAGNOSIS — N926 Irregular menstruation, unspecified: Secondary | ICD-10-CM

## 2024-02-27 NOTE — Progress Notes (Signed)
   NURSE VISIT- VAGINITIS/STD/POC  SUBJECTIVE:  Christin Moline is a 28 y.o. [id number] GYN patientfemale here for a vaginal swab for vaginitis screening, STD screen.  She reports the following symptoms: abnormal bleeding: irregular periods for 1 month. ALso has discharge, is concerned that she may have trich. Denies abnormal vaginal bleeding, significant pelvic pain, fever, or UTI symptoms.  OBJECTIVE:  There were no vitals taken for this visit.  Appears well, in no apparent distress  ASSESSMENT: Vaginal swab for STD screen/vaginitis  PLAN: Self-collected vaginal probe for Gonorrhea, Chlamydia, Trichomonas, Bacterial Vaginosis, Yeast sent to lab Treatment: to be determined once results are received Follow-up as needed if symptoms persist/worsen, or new symptoms develop  Alan LITTIE Fischer  02/27/2024 9:54 AM

## 2024-03-01 ENCOUNTER — Ambulatory Visit: Payer: Self-pay | Admitting: Adult Health

## 2024-03-01 LAB — CERVICOVAGINAL ANCILLARY ONLY
Bacterial Vaginitis (gardnerella): POSITIVE — AB
Candida Glabrata: NEGATIVE
Candida Vaginitis: POSITIVE — AB
Chlamydia: NEGATIVE
Comment: NEGATIVE
Comment: NEGATIVE
Comment: NEGATIVE
Comment: NEGATIVE
Comment: NEGATIVE
Comment: NORMAL
Neisseria Gonorrhea: NEGATIVE
Trichomonas: NEGATIVE

## 2024-03-01 MED ORDER — TINIDAZOLE 500 MG PO TABS: 2.0000 g | ORAL_TABLET | Freq: Every day | ORAL | 0 refills | Status: DC

## 2024-03-01 MED ORDER — FLUCONAZOLE 150 MG PO TABS: ORAL_TABLET | ORAL | 1 refills | Status: DC

## 2024-03-02 ENCOUNTER — Encounter: Payer: Self-pay | Admitting: *Deleted

## 2024-03-03 ENCOUNTER — Ambulatory Visit: Admitting: Family Medicine

## 2024-03-04 ENCOUNTER — Ambulatory Visit: Admitting: Family Medicine

## 2024-03-18 ENCOUNTER — Other Ambulatory Visit: Payer: Self-pay | Admitting: Women's Health

## 2024-03-22 ENCOUNTER — Encounter: Payer: Self-pay | Admitting: Adult Health

## 2024-03-22 ENCOUNTER — Ambulatory Visit: Admitting: Adult Health

## 2024-03-22 ENCOUNTER — Other Ambulatory Visit (HOSPITAL_COMMUNITY)
Admission: RE | Admit: 2024-03-22 | Discharge: 2024-03-22 | Disposition: A | Discharge status: Home / Self Care | Source: Ambulatory Visit | Attending: Adult Health | Admitting: Adult Health

## 2024-03-22 VITALS — BP 124/84 | HR 81 | Ht 64.0 in | Wt 177.0 lb

## 2024-03-22 DIAGNOSIS — N760 Acute vaginitis: Secondary | ICD-10-CM

## 2024-03-22 DIAGNOSIS — R82998 Other abnormal findings in urine: Secondary | ICD-10-CM

## 2024-03-22 DIAGNOSIS — R350 Frequency of micturition: Secondary | ICD-10-CM

## 2024-03-22 DIAGNOSIS — N898 Other specified noninflammatory disorders of vagina: Secondary | ICD-10-CM | POA: Insufficient documentation

## 2024-03-22 DIAGNOSIS — B9689 Other specified bacterial agents as the cause of diseases classified elsewhere: Secondary | ICD-10-CM | POA: Diagnosis not present

## 2024-03-22 LAB — POCT WET PREP (WET MOUNT)
Clue Cells Wet Prep Whiff POC: POSITIVE
Trichomonas Wet Prep HPF POC: ABSENT
WBC, Wet Prep HPF POC: POSITIVE

## 2024-03-22 LAB — POCT URINALYSIS DIPSTICK
Blood, UA: NEGATIVE
Glucose, UA: NEGATIVE
Nitrite, UA: NEGATIVE
Protein, UA: POSITIVE — AB

## 2024-03-22 MED ORDER — NUVESSA 1.3 % VA GEL: 1.0000 | Freq: Once | VAGINAL | Status: AC

## 2024-03-22 NOTE — Progress Notes (Signed)
  Subjective:     Patient ID: Jacqueline Barrera, female   DOB: 10-Sep-1995, 28 y.o.   MRN: [id number]  HPI Jacqueline Barrera is a 28 year old black female, single, [id number], in complaining of vaginal odor and discharge, and urinary frequency. Has been treated for yeast and BV recently.      Component Value Date/Time   DIAGPAP  07/08/2022 1502    - Negative for intraepithelial lesion or malignancy (NILM)   DIAGPAP  06/01/2018 0000    NEGATIVE FOR INTRAEPITHELIAL LESIONS OR MALIGNANCY.   DIAGPAP  04/18/2016 0000    NEGATIVE FOR INTRAEPITHELIAL LESIONS OR MALIGNANCY.   HPVHIGH Negative 07/08/2022 1502   ADEQPAP  07/08/2022 1502    Satisfactory for evaluation; transformation zone component PRESENT.   ADEQPAP  06/01/2018 0000    Satisfactory for evaluation  endocervical/transformation zone component PRESENT.   ADEQPAP  04/18/2016 0000    Satisfactory for evaluation  endocervical/transformation zone component PRESENT.   PCP is JAYSON Carpen NP  Review of Systems +vaginal odor +vaginal discharge +urinary frequency No new partners Reviewed past medical,surgical, social and family history. Reviewed medications and allergies.     Objective:   Physical Exam BP 124/84 (BP Location: Right Arm, Patient Position: Sitting, Cuff Size: Normal)   Pulse 81   Ht 5' 4 (1.626 m)   Wt 177 lb (80.3 kg)   LMP 03/01/2024 (Approximate)   Breastfeeding No   BMI 30.38 kg/m  urine dipstick trace ketones, trace protein, 1+ leuks. Skin warm and dry.Pelvic: external genitalia is normal in appearance no lesions, vagina: white discharge with odor,urethra has no lesions or masses noted, cervix:smooth and bulbous, uterus: normal size, shape and contour, non tender, no masses felt, adnexa: no masses or tenderness noted. Bladder is non tender and no masses felt. Wet prep: + for clue cells and +WBCs. CV swab obtained.   Fall risk is low  [facility] - 03/22/24 1507       Pregnancy Intention Screening   Does the patient want to become  pregnant in the next year? No    Does the patient's partner want to become pregnant in the next year? No    Would the patient like to discuss contraceptive options today? No      Contraception Wrap Up   Current Method Oral Contraceptive    End Method Oral Contraceptive    Contraception Counseling Provided Yes         Examination chaperoned by Clarita Salt LPN  Assessment:     1. Urinary frequency +UF, Urine sent for UA C&S sent to rule out UTI Try to double void  - POCT Urinalysis Dipstick - Urine Culture - Urinalysis, Routine w reflex microscopic  2. Vaginal discharge CV swab sent for GC/CHL,trich, BV and yeast  - Cervicovaginal ancillary only( [place]) - POCT Wet Prep [employer] [facility])  3. Vaginal odor +odor, CV swab sent +clue cells on wet prep - Cervicovaginal ancillary only( [place]) - POCT Wet Prep [employer] [facility])  4. BV (bacterial vaginosis) (Primary) +Clue cells on wet prep Gave 1 tube Nuvessa  to use, no sex Meds ordered this encounter  Medications   metroNIDAZOLE  (NUVESSA ) 1.3 % GEL    Sig: Place 1 Applicatorful vaginally once for 1 dose.    Supervising Provider:   JAYNE VONN DEL [[id number]]    - POCT Wet Prep [facility])     Plan:     Follow up prn

## 2024-03-23 LAB — URINALYSIS, ROUTINE W REFLEX MICROSCOPIC
Bilirubin, UA: NEGATIVE
Glucose, UA: NEGATIVE
Leukocytes,UA: NEGATIVE
Nitrite, UA: NEGATIVE
RBC, UA: NEGATIVE
Specific Gravity, UA: >=1.03 — AB (ref 1.005–1.030)
Urobilinogen, Ur: 0.2 mg/dL (ref 0.2–1.0)
pH, UA: 6 (ref 5.0–7.5)

## 2024-03-24 ENCOUNTER — Ambulatory Visit: Payer: Self-pay | Admitting: Adult Health

## 2024-03-24 LAB — URINE CULTURE

## 2024-03-25 ENCOUNTER — Ambulatory Visit: Payer: Self-pay | Admitting: Family Medicine

## 2024-03-25 VITALS — BP 118/78 | HR 97 | Temp 97.8°F | Ht 64.0 in | Wt 178.0 lb

## 2024-03-25 DIAGNOSIS — Z0001 Encounter for general adult medical examination with abnormal findings: Secondary | ICD-10-CM | POA: Diagnosis not present

## 2024-03-25 DIAGNOSIS — Z818 Family history of other mental and behavioral disorders: Secondary | ICD-10-CM

## 2024-03-25 DIAGNOSIS — Z1329 Encounter for screening for other suspected endocrine disorder: Secondary | ICD-10-CM | POA: Diagnosis not present

## 2024-03-25 DIAGNOSIS — Z0000 Encounter for general adult medical examination without abnormal findings: Secondary | ICD-10-CM

## 2024-03-25 DIAGNOSIS — Z1331 Encounter for screening for depression: Secondary | ICD-10-CM

## 2024-03-25 DIAGNOSIS — Z1321 Encounter for screening for nutritional disorder: Secondary | ICD-10-CM | POA: Diagnosis not present

## 2024-03-25 DIAGNOSIS — Z130 Encounter for screening for diseases of the blood and blood-forming organs and certain disorders involving the immune mechanism: Secondary | ICD-10-CM | POA: Diagnosis not present

## 2024-03-25 DIAGNOSIS — H7291 Unspecified perforation of tympanic membrane, right ear: Secondary | ICD-10-CM | POA: Diagnosis not present

## 2024-03-25 DIAGNOSIS — Z13228 Encounter for screening for other metabolic disorders: Secondary | ICD-10-CM | POA: Diagnosis not present

## 2024-03-25 DIAGNOSIS — G43809 Other migraine, not intractable, without status migrainosus: Secondary | ICD-10-CM

## 2024-03-25 DIAGNOSIS — R2231 Localized swelling, mass and lump, right upper limb: Secondary | ICD-10-CM | POA: Diagnosis not present

## 2024-03-25 DIAGNOSIS — F419 Anxiety disorder, unspecified: Secondary | ICD-10-CM

## 2024-03-25 DIAGNOSIS — Z136 Encounter for screening for cardiovascular disorders: Secondary | ICD-10-CM | POA: Diagnosis not present

## 2024-03-25 DIAGNOSIS — Z6830 Body mass index (BMI) 30.0-30.9, adult: Secondary | ICD-10-CM

## 2024-03-25 LAB — CERVICOVAGINAL ANCILLARY ONLY
Bacterial Vaginitis (gardnerella): POSITIVE — AB
Candida Glabrata: NEGATIVE
Candida Vaginitis: NEGATIVE
Chlamydia: NEGATIVE
Comment: NEGATIVE
Comment: NEGATIVE
Comment: NEGATIVE
Comment: NEGATIVE
Comment: NEGATIVE
Comment: NORMAL
Neisseria Gonorrhea: NEGATIVE
Trichomonas: NEGATIVE

## 2024-03-25 LAB — BAYER DCA HB A1C WAIVED: HB A1C (BAYER DCA - WAIVED): 5 % (ref 4.8–5.6)

## 2024-03-25 NOTE — Progress Notes (Deleted)
 Annual Wellness Visit   Patient: Jacqueline Barrera, Female    DOB: [birth year], 28 y.o.   MRN: [id number]  Chief Complaint  Patient presents with   Establish Care   Annual Exam    Subjective:    Jacqueline Barrera is a 28 y.o. female who presents today for her Annual Wellness Visit. She reports consuming a general diet. {Exercise:19826} She generally feels well. She reports sleeping well. She does have additional problems to discuss today.   HPI  Discussed the use of AI scribe software for clinical note transcription with the patient, who gave verbal consent to proceed.  History of Present Illness    {VISON DENTAL STD PSA (Optional):27386}  {History (Optional):23778}  Medications: Outpatient Medications Prior to Visit  Medication Sig   propranolol  ER (INDERAL  LA) 60 MG 24 hr capsule Take 1 capsule (60 mg total) by mouth daily. (Patient taking differently: Take 60 mg by mouth as needed.)   SLYND  4 MG TABS TAKE ONE (1) TABLET (4 MG TOTAL) BY MOUTH DAILY.   [DISCONTINUED] potassium chloride  (KLOR-CON  M) 10 MEQ tablet Take 1 tablet (10 mEq total) by mouth every other day.   No facility-administered medications prior to visit.    Allergies  Allergen Reactions   Food Nausea And Vomiting     Jalapeo - nausea/vomiting    Patient Care Team: Alcus Oneil ORN, FNP as PCP - General (Family Medicine)  ROS   Objective:  Objective  BP 118/78   Pulse 97   Temp 97.8 F (36.6 C)   Ht 5' 4 (1.626 m)   Wt 178 lb (80.7 kg)   LMP 03/01/2024 (Approximate)   SpO2 97%   BMI 30.55 kg/m  {Vitals History (Optional):23777}  Physical Exam Vitals reviewed.  Constitutional:      Appearance: Normal appearance.  HENT:     Head: Normocephalic and atraumatic.     Right Ear: Ear canal and external ear normal.     Left Ear: Tympanic membrane, ear canal and external ear normal.     Ears:     Comments: Small perforation to R TM    Nose: Nose normal.     Mouth/Throat:     Mouth: Mucous  membranes are moist.     Pharynx: Oropharynx is clear.  Eyes:     Extraocular Movements: Extraocular movements intact.     Conjunctiva/sclera: Conjunctivae normal.     Pupils: Pupils are equal, round, and reactive to light.  Cardiovascular:     Rate and Rhythm: Normal rate and regular rhythm.     Pulses: Normal pulses.     Heart sounds: Normal heart sounds. No murmur heard. Pulmonary:     Effort: Pulmonary effort is normal. No respiratory distress.     Breath sounds: Normal breath sounds.  Abdominal:     Palpations: Abdomen is soft. There is no mass.  Musculoskeletal:        General: Normal range of motion.     Cervical back: Normal range of motion and neck supple.     Comments: Mass to   Skin:    General: Skin is warm and dry.     Capillary Refill: Capillary refill takes less than 2 seconds.  Neurological:     General: No focal deficit present.     Mental Status: She is alert and oriented to person, place, and time.  Psychiatric:        Mood and Affect: Mood normal.        Behavior: Behavior normal.     {  PhysExam Abridge (Optional):210964309} Most recent functional status assessment:     No data to display         Most recent fall risk assessment:    03/25/2024   10:59 AM  Fall Risk   Falls in the past year? 0  Number falls in past yr: 0  Injury with Fall? 0  Risk for fall due to : No Fall Risks  Follow up Falls evaluation completed    Most recent depression screenings:    03/25/2024   10:59 AM 12/30/2023    1:39 PM  PHQ 2/9 Scores  PHQ - 2 Score 2 4  PHQ- 9 Score 12 13      Data saved with a previous flowsheet row definition   Most recent cognitive screening:     No data to display            03/25/2024   11:23 AM 03/25/2024   11:00 AM 12/30/2023    1:40 PM 06/10/2022    3:08 PM  GAD 7 : Generalized Anxiety Score  Nervous, Anxious, on Edge 1 1 3 3   Control/stop worrying 3 3 3 3   Worry too much - different things 3 3 3 3   Trouble relaxing 1 1 1  1   Restless 1 1 1 1   Easily annoyed or irritable 3 3 3 3   Afraid - awful might happen 1 1 0 0  Total GAD 7 Score 13 13 14 14   Anxiety Difficulty Not difficult at all Not difficult at all Very difficult       Vision/Hearing Screen: No results found.  {Labs (Optional):23779} No results found for any visits on 03/25/24.   Assessment & Plan:    Annual wellness visit done today including the all of the following: Reviewed patient's Family Medical History Reviewed and updated list of patient's medical providers Assessment of cognitive impairment was done Assessed patient's functional ability Established a written schedule for health screening services Health Risk Assessment Completed and Reviewed  Exercise Activities and Dietary recommendations  Goals   None     Immunization History  Administered Date(s) Administered   DTaP 01/16/1994, 03/27/1994, 07/03/1994, 11/06/1994   HIB (PRP-OMP) 01/16/1994, 03/27/1994, 07/03/1994, 11/06/1994   HPV Quadrivalent 08/13/2006, 10/15/2006, 03/06/2007   Hepatitis A, Ped/Adol-2 Dose 08/13/2006   Hepatitis B, PED/ADOLESCENT 05/31/93, 01/16/1994, 07/03/1994   IPV 01/16/1994, 03/27/1994, 07/03/1994   MMR 11/06/1994   Pneumococcal Polysaccharide-23 08/13/2006   Td 08/15/2011   Tdap 08/15/2011   Unspecified SARS-COV-2 Vaccination 03/09/2020, 04/06/2020   Varicella 08/13/2006    Health Maintenance  Topic Date Due   Influenza Vaccine  08/17/2024 (Originally 12/19/2023)   DTaP/Tdap/Td (7 - Td or Tdap) 03/25/2025 (Originally 08/14/2021)   Pneumococcal Vaccine (2 of 2 - PCV) 03/25/2025 (Originally 08/13/2007)   COVID-19 Vaccine (3 - 2025-26 season) 04/10/2025 (Originally 01/19/2024)   Cervical Cancer Screening (HPV/Pap Cotest)  07/08/2025   Hepatitis B Vaccines 19-59 Average Risk  Completed   HPV VACCINES  Completed   Hepatitis C Screening  Completed   HIV Screening  Completed   Meningococcal B Vaccine  Aged Out    Discussed health benefits  of physical activity, and encouraged her to engage in regular exercise appropriate for her age and condition.    Problem List Items Addressed This Visit       Nervous and Auditory   Tympanic membrane perforation, right   Relevant Orders   Ambulatory referral to ENT     Other   Ganglion cyst of dorsum of  right wrist - Primary   Relevant Orders   Ambulatory referral to Orthopedic Surgery   Other Visit Diagnoses       Screening for endocrine, nutritional, metabolic and immunity disorder       Relevant Orders   Comprehensive metabolic panel   CBC with Differential   Bayer DCA Hb A1c Waived     Encounter for screening for cardiovascular disorders       Relevant Orders   Lipid Panel     BMI 30.0-30.9,adult           Assessment and Plan Assessment & Plan     Return in about 1 year (around 03/25/2025).     Oneil LELON Severin, FNP [facility]

## 2024-03-25 NOTE — Progress Notes (Signed)
 Annual Wellness Visit   Patient: Jacqueline Barrera, Female    DOB: 12/26/1995, 28 y.o.   MRN: [id number]  Chief Complaint  Patient presents with   Establish Care   Annual Exam    Subjective:    Jacqueline Barrera is a 28 y.o. female who presents today for her Annual Wellness Visit. She reports consuming a general diet. The patient does not participate in regular exercise at present. She generally feels well. She reports sleeping well. She does have additional problems to discuss today.   HPI  Discussed the use of AI scribe software for clinical note transcription with the patient, who gave verbal consent to proceed.  History of Present Illness   Jacqueline Barrera is a 28 year old female who presents for a yearly physical exam and evaluation of a mass to R wrist. Reports that she needs physical performed before end of year for insurance discount.   Mass of R wrist - patient reports hx of ganglion cyst R wrist. Reports that it went away and came back about 1 month ago. - Patient states that she was previously referred to surgery but never received a call from them  Headaches - History of headaches previously managed with propranolol  - Patient stopped taking propranolol  and reports that her headaches have improved to the point that she does need medication  Chronic tympanic membrane perforation and ear infections - Chronic perforation of the right tympanic membrane identified previously by ENT specialist - Was recommended to have hearing test performed but never followed up.  - History of frequent ear infections  Smokes marijauna - Not interested in stopping at this time.     Patient reports that she has had depression and anxiety symptoms for the past several months. Symptoms have stayed about the same.  - Denies suicidal ideation. - Patient requesting bipolar evaluation.  She reports that the smallest things can set her off.  She reports that she has several family members with  bipolar. - Reports that she is not interested in starting medication for depression or anxiety at this time.   Followed by OBGYN - Had pap last year  Reports going to the dentist      Medications: Outpatient Medications Prior to Visit  Medication Sig   propranolol  ER (INDERAL  LA) 60 MG 24 hr capsule Take 1 capsule (60 mg total) by mouth daily. (Patient taking differently: Take 60 mg by mouth as needed.)   SLYND  4 MG TABS TAKE ONE (1) TABLET (4 MG TOTAL) BY MOUTH DAILY.   [DISCONTINUED] potassium chloride  (KLOR-CON  M) 10 MEQ tablet Take 1 tablet (10 mEq total) by mouth every other day.   No facility-administered medications prior to visit.    Allergies  Allergen Reactions   Food Nausea And Vomiting     Jalapeo - nausea/vomiting    Patient Care Team: Alcus Oneil ORN, FNP as PCP - General (Family Medicine)  ROS   Objective:  Objective  BP 118/78   Pulse 97   Temp 97.8 F (36.6 C)   Ht 5' 4 (1.626 m)   Wt 178 lb (80.7 kg)   LMP 03/01/2024 (Approximate)   SpO2 97%   BMI 30.55 kg/m    Physical Exam Vitals reviewed.  Constitutional:      Appearance: Normal appearance.  HENT:     Head: Normocephalic and atraumatic.     Right Ear: Ear canal and external ear normal.     Left Ear: Tympanic membrane, ear canal and external ear normal.  Ears:     Comments: Small perforation to R TM, no drainage    Nose: Nose normal.     Mouth/Throat:     Mouth: Mucous membranes are moist.     Pharynx: Oropharynx is clear.  Eyes:     Extraocular Movements: Extraocular movements intact.     Conjunctiva/sclera: Conjunctivae normal.     Pupils: Pupils are equal, round, and reactive to light.  Cardiovascular:     Rate and Rhythm: Normal rate and regular rhythm.     Pulses: Normal pulses.     Heart sounds: Normal heart sounds. No murmur heard. Pulmonary:     Effort: Pulmonary effort is normal. No respiratory distress.     Breath sounds: Normal breath sounds.  Musculoskeletal:         General: No deformity. Normal range of motion.     Cervical back: Normal range of motion and neck supple.     Comments: Marble sized mass to palm side right wrist, no erythema  Skin:    General: Skin is warm and dry.     Capillary Refill: Capillary refill takes less than 2 seconds.  Neurological:     General: No focal deficit present.     Mental Status: She is alert and oriented to person, place, and time.  Psychiatric:        Mood and Affect: Mood normal.        Behavior: Behavior normal.      Most recent functional status assessment:     No data to display         Most recent fall risk assessment:    03/25/2024   10:59 AM  Fall Risk   Falls in the past year? 0  Number falls in past yr: 0  Injury with Fall? 0  Risk for fall due to : No Fall Risks  Follow up Falls evaluation completed    Most recent depression screenings:    03/25/2024   10:59 AM 12/30/2023    1:39 PM  PHQ 2/9 Scores  PHQ - 2 Score 2 4  PHQ- 9 Score 12 13      Data saved with a previous flowsheet row definition      03/25/2024   11:23 AM 03/25/2024   11:00 AM 12/30/2023    1:40 PM 06/10/2022    3:08 PM  GAD 7 : Generalized Anxiety Score  Nervous, Anxious, on Edge 1 1 3 3   Control/stop worrying 3 3 3 3   Worry too much - different things 3 3 3 3   Trouble relaxing 1 1 1 1   Restless 1 1 1 1   Easily annoyed or irritable 3 3 3 3   Afraid - awful might happen 1 1 0 0  Total GAD 7 Score 13 13 14 14   Anxiety Difficulty Not difficult at all Not difficult at all Very difficult       Most recent cognitive screening:     No data to display           Vision/Hearing Screen: No results found.   No results found for any visits on 03/25/24.   Assessment & Plan:    Annual wellness visit done today including the all of the following: Reviewed patient's Family Medical History Reviewed and updated list of patient's medical providers Assessment of cognitive impairment was done Assessed  patient's functional ability Established a written schedule for health screening services Health Risk Assessment Completed and Reviewed  Exercise Activities and Dietary recommendations  Goals  None     Immunization History  Administered Date(s) Administered   DTaP 01/16/1994, 03/27/1994, 07/03/1994, 11/06/1994   HIB (PRP-OMP) 01/16/1994, 03/27/1994, 07/03/1994, 11/06/1994   HPV Quadrivalent 08/13/2006, 10/15/2006, 03/06/2007   Hepatitis A, Ped/Adol-2 Dose 08/13/2006   Hepatitis B, PED/ADOLESCENT May 02, 1994, 01/16/1994, 07/03/1994   IPV 01/16/1994, 03/27/1994, 07/03/1994   MMR 11/06/1994   Pneumococcal Polysaccharide-23 08/13/2006   Td 08/15/2011   Tdap 08/15/2011   Unspecified SARS-COV-2 Vaccination 03/09/2020, 04/06/2020   Varicella 08/13/2006    Health Maintenance  Topic Date Due   Influenza Vaccine  08/17/2024 (Originally 12/19/2023)   DTaP/Tdap/Td (7 - Td or Tdap) 03/25/2025 (Originally 08/14/2021)   Pneumococcal Vaccine (2 of 2 - PCV) 03/25/2025 (Originally 08/13/2007)   COVID-19 Vaccine (3 - 2025-26 season) 04/10/2025 (Originally 01/19/2024)   Cervical Cancer Screening (HPV/Pap Cotest)  07/08/2025   Hepatitis B Vaccines 19-59 Average Risk  Completed   HPV VACCINES  Completed   Hepatitis C Screening  Completed   HIV Screening  Completed   Meningococcal B Vaccine  Aged Out    Discussed health benefits of physical activity, and encouraged her to engage in regular exercise appropriate for her age and condition.    Problem List Items Addressed This Visit       Nervous and Auditory   Tympanic membrane perforation, right   Relevant Orders   Ambulatory referral to ENT   Other Visit Diagnoses       Annual physical exam    -  Primary     Mass of right wrist       Relevant Orders   Ambulatory referral to Orthopedic Surgery     Other migraine without status migrainosus, not intractable         BMI 30.0-30.9,adult         Anxiousness         Positive screening for  depression on 9-item Patient Health Questionnaire (PHQ-9)       Relevant Orders   Ambulatory referral to Psychiatry     Family history of bipolar disorder       Relevant Orders   Ambulatory referral to Psychiatry     Encounter for screening for cardiovascular disorders       Relevant Orders   Lipid Panel     Screening for endocrine, nutritional, metabolic and immunity disorder       Relevant Orders   Comprehensive metabolic panel   CBC with Differential   Bayer DCA Hb A1c Waived       Assessment and Plan    Adult Wellness Visit Vaccinations up to date except for flu shot, which she declined. - Routine blood work ordered, results pending.  Mass R wrist Likely ganglion cyst.  - Submitted referral to ortho for evaluation.  Chronic perforation of right tympanic membrane - Referred to ENT for evaluation. Was recommended by ENT previously to have a hearing test but f/u was not made.  - Advised to avoid water exposure.   Elevated phq and GAD scores - Placed referral to psychiatry per patient request for evaluation of potential bipolar disorder concerns.  - Patient declined medication management for anxiety or depression - Discussed option for therapy.  Patient is possibly interested in this after having psychiatry evaluation performed  Migraines  - Patient stopped taking propranolol  and reports that her migraines have improved to the point that she does not need medication.   Marijuana use - Discussed possible complications of use and recommended cessation.     Return in about  1 year (around 03/25/2025).     Oneil Jacqueline Severin, FNP [facility]

## 2024-03-26 ENCOUNTER — Ambulatory Visit: Payer: Self-pay | Admitting: Family Medicine

## 2024-03-26 LAB — COMPREHENSIVE METABOLIC PANEL WITH GFR
ALT: 15 IU/L (ref 0–32)
AST: 16 IU/L (ref 0–40)
Albumin: 4.7 g/dL (ref 4.0–5.0)
Alkaline Phosphatase: 68 IU/L (ref 41–116)
BUN/Creatinine Ratio: 10 (ref 9–23)
BUN: 8 mg/dL (ref 6–20)
Bilirubin Total: 0.4 mg/dL (ref 0.0–1.2)
CO2: 21 mmol/L (ref 20–29)
Calcium: 9.1 mg/dL (ref 8.7–10.2)
Chloride: 105 mmol/L (ref 96–106)
Creatinine, Ser: 0.82 mg/dL (ref 0.57–1.00)
Globulin, Total: 2.2 g/dL (ref 1.5–4.5)
Glucose: 82 mg/dL (ref 70–99)
Potassium: 3.8 mmol/L (ref 3.5–5.2)
Sodium: 139 mmol/L (ref 134–144)
Total Protein: 6.9 g/dL (ref 6.0–8.5)
eGFR: 99 mL/min/1.73 (ref >59)

## 2024-03-26 LAB — LIPID PANEL
Chol/HDL Ratio: 2.2 ratio (ref 0.0–4.4)
Cholesterol, Total: 128 mg/dL (ref 100–199)
HDL: 59 mg/dL (ref >39)
LDL Chol Calc (NIH): 58 mg/dL (ref 0–99)
Triglycerides: 46 mg/dL (ref 0–149)
VLDL Cholesterol Cal: 11 mg/dL (ref 5–40)

## 2024-03-26 LAB — CBC WITH DIFFERENTIAL/PLATELET
Basophils Absolute: 0 x10E3/uL (ref 0.0–0.2)
Basos: 1 %
EOS (ABSOLUTE): 0 x10E3/uL (ref 0.0–0.4)
Eos: 1 %
Hematocrit: 39 % (ref 34.0–46.6)
Hemoglobin: 12.6 g/dL (ref 11.1–15.9)
Immature Grans (Abs): 0 x10E3/uL (ref 0.0–0.1)
Immature Granulocytes: 0 %
Lymphocytes Absolute: 2.3 x10E3/uL (ref 0.7–3.1)
Lymphs: 56 %
MCH: 28.4 pg (ref 26.6–33.0)
MCHC: 32.3 g/dL (ref 31.5–35.7)
MCV: 88 fL (ref 79–97)
Monocytes Absolute: 0.2 x10E3/uL (ref 0.1–0.9)
Monocytes: 6 %
Neutrophils Absolute: 1.5 x10E3/uL (ref 1.4–7.0)
Neutrophils: 36 %
Platelets: 274 x10E3/uL (ref 150–450)
RBC: 4.44 x10E6/uL (ref 3.77–5.28)
RDW: 12.5 % (ref 11.7–15.4)
WBC: 4 x10E3/uL (ref 3.4–10.8)

## 2024-04-19 ENCOUNTER — Other Ambulatory Visit: Payer: Self-pay | Admitting: Adult Health

## 2024-04-19 MED ORDER — METRONIDAZOLE 500 MG PO TABS: 500.0000 mg | ORAL_TABLET | Freq: Two times a day (BID) | ORAL | 0 refills | Status: AC

## 2024-04-19 NOTE — Progress Notes (Signed)
 Rx flagyl  to [facility]

## 2024-05-07 ENCOUNTER — Encounter (INDEPENDENT_AMBULATORY_CARE_PROVIDER_SITE_OTHER): Payer: Self-pay

## 2024-05-07 ENCOUNTER — Institutional Professional Consult (permissible substitution) (INDEPENDENT_AMBULATORY_CARE_PROVIDER_SITE_OTHER): Admitting: Otolaryngology

## 2024-05-17 ENCOUNTER — Encounter

## 2024-05-18 ENCOUNTER — Encounter

## 2025-03-29 ENCOUNTER — Encounter: Admitting: Family Medicine
